# Patient Record
Sex: Male | Born: 1940
Health system: Southern US, Community
[De-identification: ages and names within clinical notes are randomized; demographics above are authoritative.]

## PROBLEM LIST (undated history)

## (undated) DIAGNOSIS — N138 Other obstructive and reflux uropathy: Secondary | ICD-10-CM

## (undated) DIAGNOSIS — Z972 Presence of dental prosthetic device (complete) (partial): Secondary | ICD-10-CM

## (undated) DIAGNOSIS — N401 Enlarged prostate with lower urinary tract symptoms: Secondary | ICD-10-CM

## (undated) DIAGNOSIS — M950 Acquired deformity of nose: Secondary | ICD-10-CM

## (undated) DIAGNOSIS — M5030 Other cervical disc degeneration, unspecified cervical region: Secondary | ICD-10-CM

## (undated) DIAGNOSIS — C4331 Malignant melanoma of nose: Secondary | ICD-10-CM

## (undated) DIAGNOSIS — K429 Umbilical hernia without obstruction or gangrene: Secondary | ICD-10-CM

## (undated) DIAGNOSIS — R37 Sexual dysfunction, unspecified: Secondary | ICD-10-CM

## (undated) DIAGNOSIS — E785 Hyperlipidemia, unspecified: Secondary | ICD-10-CM

## (undated) DIAGNOSIS — K219 Gastro-esophageal reflux disease without esophagitis: Secondary | ICD-10-CM

## (undated) DIAGNOSIS — I10 Essential (primary) hypertension: Secondary | ICD-10-CM

## (undated) DIAGNOSIS — Z973 Presence of spectacles and contact lenses: Secondary | ICD-10-CM

## (undated) DIAGNOSIS — Z860101 Personal history of adenomatous and serrated colon polyps: Secondary | ICD-10-CM

## (undated) DIAGNOSIS — M1990 Unspecified osteoarthritis, unspecified site: Secondary | ICD-10-CM

## (undated) DIAGNOSIS — Z8719 Personal history of other diseases of the digestive system: Secondary | ICD-10-CM

## (undated) DIAGNOSIS — K4021 Bilateral inguinal hernia, without obstruction or gangrene, recurrent: Secondary | ICD-10-CM

## (undated) DIAGNOSIS — Z9889 Other specified postprocedural states: Secondary | ICD-10-CM

## (undated) DIAGNOSIS — K5730 Diverticulosis of large intestine without perforation or abscess without bleeding: Secondary | ICD-10-CM

## (undated) DIAGNOSIS — K449 Diaphragmatic hernia without obstruction or gangrene: Secondary | ICD-10-CM

## (undated) DIAGNOSIS — R05 Cough: Secondary | ICD-10-CM

## (undated) DIAGNOSIS — Z86010 Personal history of colonic polyps: Secondary | ICD-10-CM

## (undated) DIAGNOSIS — R7303 Prediabetes: Secondary | ICD-10-CM

## (undated) HISTORY — DX: Gastro-esophageal reflux disease without esophagitis: K21.9

## (undated) HISTORY — PX: INGUINAL HERNIA REPAIR: SUR1180

## (undated) HISTORY — PX: COLONOSCOPY: SHX174

## (undated) HISTORY — PX: ESOPHAGOGASTRODUODENOSCOPY (EGD) WITH ESOPHAGEAL DILATION: SHX5812

## (undated) HISTORY — PX: UMBILICAL HERNIA REPAIR: SHX196

## (undated) HISTORY — PX: HEMORROIDECTOMY: SUR656

---

## 1999-02-17 ENCOUNTER — Emergency Department (HOSPITAL_COMMUNITY): Admission: EM | Admit: 1999-02-17 | Discharge: 1999-02-17 | Payer: Self-pay | Admitting: Emergency Medicine

## 1999-02-19 ENCOUNTER — Emergency Department (HOSPITAL_COMMUNITY): Admission: EM | Admit: 1999-02-19 | Discharge: 1999-02-19 | Payer: Self-pay | Admitting: Emergency Medicine

## 1999-05-20 ENCOUNTER — Ambulatory Visit (HOSPITAL_COMMUNITY): Admission: RE | Admit: 1999-05-20 | Discharge: 1999-05-20 | Payer: Self-pay | Admitting: Emergency Medicine

## 1999-05-20 ENCOUNTER — Encounter: Payer: Self-pay | Admitting: Emergency Medicine

## 2001-01-01 ENCOUNTER — Encounter (INDEPENDENT_AMBULATORY_CARE_PROVIDER_SITE_OTHER): Payer: Self-pay

## 2001-01-01 ENCOUNTER — Ambulatory Visit (HOSPITAL_COMMUNITY): Admission: RE | Admit: 2001-01-01 | Discharge: 2001-01-01 | Payer: Self-pay | Admitting: Gastroenterology

## 2002-01-01 ENCOUNTER — Encounter: Payer: Self-pay | Admitting: Emergency Medicine

## 2002-01-01 ENCOUNTER — Encounter: Admission: RE | Admit: 2002-01-01 | Discharge: 2002-01-01 | Payer: Self-pay | Admitting: Emergency Medicine

## 2005-01-30 ENCOUNTER — Ambulatory Visit: Payer: Self-pay | Admitting: Gastroenterology

## 2005-02-13 ENCOUNTER — Encounter (INDEPENDENT_AMBULATORY_CARE_PROVIDER_SITE_OTHER): Payer: Self-pay | Admitting: *Deleted

## 2005-02-13 ENCOUNTER — Ambulatory Visit: Payer: Self-pay | Admitting: Gastroenterology

## 2005-10-19 ENCOUNTER — Ambulatory Visit: Payer: Self-pay | Admitting: Gastroenterology

## 2005-10-25 ENCOUNTER — Ambulatory Visit: Payer: Self-pay | Admitting: Gastroenterology

## 2005-11-24 ENCOUNTER — Ambulatory Visit: Payer: Self-pay | Admitting: Gastroenterology

## 2006-02-02 ENCOUNTER — Ambulatory Visit: Payer: Self-pay | Admitting: Gastroenterology

## 2006-02-20 ENCOUNTER — Ambulatory Visit: Payer: Self-pay | Admitting: Gastroenterology

## 2006-03-01 ENCOUNTER — Encounter: Payer: Self-pay | Admitting: Gastroenterology

## 2008-09-28 ENCOUNTER — Ambulatory Visit: Payer: Self-pay | Admitting: Internal Medicine

## 2008-09-28 DIAGNOSIS — E785 Hyperlipidemia, unspecified: Secondary | ICD-10-CM

## 2008-09-28 LAB — CONVERTED CEMR LAB
Cholesterol, target level: 200 mg/dL
HDL goal, serum: 40 mg/dL
LDL Goal: 160 mg/dL

## 2008-12-03 ENCOUNTER — Ambulatory Visit: Payer: Self-pay | Admitting: Internal Medicine

## 2008-12-03 DIAGNOSIS — R9431 Abnormal electrocardiogram [ECG] [EKG]: Secondary | ICD-10-CM

## 2008-12-03 DIAGNOSIS — R351 Nocturia: Secondary | ICD-10-CM

## 2008-12-03 DIAGNOSIS — M19049 Primary osteoarthritis, unspecified hand: Secondary | ICD-10-CM | POA: Insufficient documentation

## 2008-12-03 DIAGNOSIS — N401 Enlarged prostate with lower urinary tract symptoms: Secondary | ICD-10-CM

## 2008-12-03 LAB — CONVERTED CEMR LAB
ALT: 20 units/L (ref 0–53)
AST: 27 units/L (ref 0–37)
Albumin: 4 g/dL (ref 3.5–5.2)
Alkaline Phosphatase: 59 units/L (ref 39–117)
BUN: 20 mg/dL (ref 6–23)
Basophils Absolute: 0 10*3/uL (ref 0.0–0.1)
Basophils Relative: 0.6 % (ref 0.0–3.0)
Bilirubin Urine: NEGATIVE
Bilirubin, Direct: 0.1 mg/dL (ref 0.0–0.3)
CO2: 31 meq/L (ref 19–32)
Calcium: 8.9 mg/dL (ref 8.4–10.5)
Chloride: 106 meq/L (ref 96–112)
Cholesterol: 201 mg/dL — ABNORMAL HIGH (ref 0–200)
Creatinine, Ser: 0.9 mg/dL (ref 0.4–1.5)
Direct LDL: 138 mg/dL
Eosinophils Absolute: 0.2 10*3/uL (ref 0.0–0.7)
Eosinophils Relative: 3 % (ref 0.0–5.0)
GFR calc non Af Amer: 89.2 mL/min (ref >60)
Glucose, Bld: 108 mg/dL — ABNORMAL HIGH (ref 70–99)
HCT: 42.4 % (ref 39.0–52.0)
HDL: 53.1 mg/dL (ref >39.00)
Hemoglobin: 14.7 g/dL (ref 13.0–17.0)
Ketones, ur: NEGATIVE mg/dL
Leukocytes, UA: NEGATIVE
Lymphocytes Relative: 32.5 % (ref 12.0–46.0)
Lymphs Abs: 1.8 10*3/uL (ref 0.7–4.0)
MCHC: 34.7 g/dL (ref 30.0–36.0)
MCV: 89.6 fL (ref 78.0–100.0)
Monocytes Absolute: 0.6 10*3/uL (ref 0.1–1.0)
Monocytes Relative: 11.2 % (ref 3.0–12.0)
Neutro Abs: 2.8 10*3/uL (ref 1.4–7.7)
Neutrophils Relative %: 52.7 % (ref 43.0–77.0)
Nitrite: NEGATIVE
PSA: 0.35 ng/mL (ref 0.10–4.00)
Platelets: 220 10*3/uL (ref 150.0–400.0)
Potassium: 4.3 meq/L (ref 3.5–5.1)
RBC: 4.72 M/uL (ref 4.22–5.81)
RDW: 12.3 % (ref 11.5–14.6)
Sodium: 142 meq/L (ref 135–145)
Specific Gravity, Urine: >=1.03 (ref 1.000–1.030)
TSH: 1.56 microintl units/mL (ref 0.35–5.50)
Total Bilirubin: 0.8 mg/dL (ref 0.3–1.2)
Total CHOL/HDL Ratio: 4
Total Protein, Urine: NEGATIVE mg/dL
Total Protein: 6.7 g/dL (ref 6.0–8.3)
Triglycerides: 47 mg/dL (ref 0.0–149.0)
Urine Glucose: NEGATIVE mg/dL
Urobilinogen, UA: 0.2 (ref 0.0–1.0)
VLDL: 9.4 mg/dL (ref 0.0–40.0)
WBC: 5.4 10*3/uL (ref 4.5–10.5)
pH: 5.5 (ref 5.0–8.0)

## 2008-12-14 DIAGNOSIS — Z860100 Personal history of colon polyps, unspecified: Secondary | ICD-10-CM | POA: Insufficient documentation

## 2008-12-14 DIAGNOSIS — K5730 Diverticulosis of large intestine without perforation or abscess without bleeding: Secondary | ICD-10-CM | POA: Insufficient documentation

## 2008-12-14 DIAGNOSIS — K2970 Gastritis, unspecified, without bleeding: Secondary | ICD-10-CM | POA: Insufficient documentation

## 2008-12-14 DIAGNOSIS — K449 Diaphragmatic hernia without obstruction or gangrene: Secondary | ICD-10-CM | POA: Insufficient documentation

## 2008-12-14 DIAGNOSIS — K222 Esophageal obstruction: Secondary | ICD-10-CM | POA: Insufficient documentation

## 2008-12-14 DIAGNOSIS — K2990 Gastroduodenitis, unspecified, without bleeding: Secondary | ICD-10-CM

## 2008-12-15 ENCOUNTER — Ambulatory Visit: Payer: Self-pay | Admitting: Gastroenterology

## 2008-12-17 ENCOUNTER — Telehealth: Payer: Self-pay | Admitting: Gastroenterology

## 2008-12-17 ENCOUNTER — Encounter: Payer: Self-pay | Admitting: Gastroenterology

## 2008-12-21 ENCOUNTER — Encounter: Payer: Self-pay | Admitting: Internal Medicine

## 2009-01-08 ENCOUNTER — Telehealth: Payer: Self-pay | Admitting: Gastroenterology

## 2009-01-11 ENCOUNTER — Ambulatory Visit: Payer: Self-pay | Admitting: Gastroenterology

## 2009-01-11 ENCOUNTER — Encounter: Payer: Self-pay | Admitting: Gastroenterology

## 2009-01-13 ENCOUNTER — Encounter: Payer: Self-pay | Admitting: Gastroenterology

## 2009-01-14 ENCOUNTER — Encounter: Payer: Self-pay | Admitting: Gastroenterology

## 2009-02-12 ENCOUNTER — Ambulatory Visit: Payer: Self-pay | Admitting: Internal Medicine

## 2009-02-12 LAB — CONVERTED CEMR LAB
ALT: 22 units/L (ref 0–53)
AST: 27 units/L (ref 0–37)
Albumin: 3.9 g/dL (ref 3.5–5.2)
Alkaline Phosphatase: 59 units/L (ref 39–117)
BUN: 24 mg/dL — ABNORMAL HIGH (ref 6–23)
Basophils Relative: 0.5 % (ref 0.0–3.0)
Bilirubin Urine: NEGATIVE
Bilirubin, Direct: 0.1 mg/dL (ref 0.0–0.3)
CO2: 31 meq/L (ref 19–32)
Calcium: 9.3 mg/dL (ref 8.4–10.5)
Chloride: 107 meq/L (ref 96–112)
Creatinine, Ser: 1 mg/dL (ref 0.4–1.5)
Eosinophils Relative: 0.8 % (ref 0.0–5.0)
GFR calc non Af Amer: 78.94 mL/min (ref >60)
Glucose, Bld: 106 mg/dL — ABNORMAL HIGH (ref 70–99)
HCT: 43.5 % (ref 39.0–52.0)
Hemoglobin, Urine: NEGATIVE
Hemoglobin: 15 g/dL (ref 13.0–17.0)
Leukocytes, UA: NEGATIVE
Lymphocytes Relative: 7.8 % — ABNORMAL LOW (ref 12.0–46.0)
MCHC: 34.5 g/dL (ref 30.0–36.0)
MCV: 90.5 fL (ref 78.0–100.0)
Monocytes Relative: 6.1 % (ref 3.0–12.0)
Neutrophils Relative %: 84.8 % — ABNORMAL HIGH (ref 43.0–77.0)
Nitrite: NEGATIVE
Platelets: 155 10*3/uL (ref 150.0–400.0)
Potassium: 3.8 meq/L (ref 3.5–5.1)
RBC: 4.81 M/uL (ref 4.22–5.81)
RDW: 12.7 % (ref 11.5–14.6)
Sodium: 141 meq/L (ref 135–145)
Specific Gravity, Urine: >=1.03 (ref 1.000–1.030)
Total Bilirubin: 0.8 mg/dL (ref 0.3–1.2)
Total Protein, Urine: NEGATIVE mg/dL
Total Protein: 7 g/dL (ref 6.0–8.3)
Urine Glucose: NEGATIVE mg/dL
Urobilinogen, UA: 0.2 (ref 0.0–1.0)
WBC: 8.8 10*3/uL (ref 4.5–10.5)
pH: 5.5 (ref 5.0–8.0)

## 2009-02-13 ENCOUNTER — Encounter: Payer: Self-pay | Admitting: Internal Medicine

## 2009-06-09 ENCOUNTER — Ambulatory Visit: Payer: Self-pay | Admitting: Internal Medicine

## 2009-12-30 ENCOUNTER — Encounter: Payer: Self-pay | Admitting: Gastroenterology

## 2010-01-12 ENCOUNTER — Telehealth: Payer: Self-pay | Admitting: Gastroenterology

## 2010-01-13 ENCOUNTER — Telehealth: Payer: Self-pay | Admitting: Gastroenterology

## 2010-02-01 ENCOUNTER — Ambulatory Visit: Payer: Self-pay | Admitting: Internal Medicine

## 2010-02-01 ENCOUNTER — Telehealth: Payer: Self-pay | Admitting: Internal Medicine

## 2010-02-01 DIAGNOSIS — M79609 Pain in unspecified limb: Secondary | ICD-10-CM

## 2010-02-01 LAB — CONVERTED CEMR LAB
ALT: 24 units/L (ref 0–53)
AST: 28 units/L (ref 0–37)
Albumin: 4.3 g/dL (ref 3.5–5.2)
Alkaline Phosphatase: 61 units/L (ref 39–117)
BUN: 21 mg/dL (ref 6–23)
Basophils Absolute: 0.1 10*3/uL (ref 0.0–0.1)
Basophils Relative: 1 % (ref 0.0–3.0)
Bilirubin, Direct: 0.1 mg/dL (ref 0.0–0.3)
CO2: 30 meq/L (ref 19–32)
Calcium: 9 mg/dL (ref 8.4–10.5)
Chloride: 106 meq/L (ref 96–112)
Cholesterol: 219 mg/dL — ABNORMAL HIGH (ref 0–200)
Creatinine, Ser: 0.9 mg/dL (ref 0.4–1.5)
Direct LDL: 208.6 mg/dL
Eosinophils Absolute: 0.2 10*3/uL (ref 0.0–0.7)
Eosinophils Relative: 2.7 % (ref 0.0–5.0)
GFR calc non Af Amer: 86.66 mL/min (ref >60)
Glucose, Bld: 89 mg/dL (ref 70–99)
HCT: 43.3 % (ref 39.0–52.0)
HDL: 56.6 mg/dL (ref >39.00)
Hemoglobin: 15 g/dL (ref 13.0–17.0)
Lymphocytes Relative: 26.4 % (ref 12.0–46.0)
Lymphs Abs: 1.9 10*3/uL (ref 0.7–4.0)
MCHC: 34.7 g/dL (ref 30.0–36.0)
MCV: 90.5 fL (ref 78.0–100.0)
Monocytes Absolute: 0.8 10*3/uL (ref 0.1–1.0)
Monocytes Relative: 11 % (ref 3.0–12.0)
Neutro Abs: 4.2 10*3/uL (ref 1.4–7.7)
Neutrophils Relative %: 58.9 % (ref 43.0–77.0)
PSA: 0.48 ng/mL (ref 0.10–4.00)
Platelets: 227 10*3/uL (ref 150.0–400.0)
Potassium: 4.6 meq/L (ref 3.5–5.1)
RBC: 4.78 M/uL (ref 4.22–5.81)
RDW: 13.5 % (ref 11.5–14.6)
Sodium: 141 meq/L (ref 135–145)
TSH: 1.83 microintl units/mL (ref 0.35–5.50)
Total Bilirubin: 0.7 mg/dL (ref 0.3–1.2)
Total CHOL/HDL Ratio: 4
Total Protein: 6.8 g/dL (ref 6.0–8.3)
Triglycerides: 139 mg/dL (ref 0.0–149.0)
VLDL: 27.8 mg/dL (ref 0.0–40.0)
WBC: 7.1 10*3/uL (ref 4.5–10.5)

## 2010-02-02 ENCOUNTER — Encounter: Payer: Self-pay | Admitting: Internal Medicine

## 2010-02-02 ENCOUNTER — Telehealth: Payer: Self-pay | Admitting: Internal Medicine

## 2010-02-04 ENCOUNTER — Telehealth: Payer: Self-pay | Admitting: Internal Medicine

## 2010-05-11 ENCOUNTER — Ambulatory Visit: Payer: Self-pay | Admitting: Internal Medicine

## 2010-05-11 LAB — CONVERTED CEMR LAB
ALT: 29 units/L (ref 0–53)
AST: 28 units/L (ref 0–37)
Albumin: 4.4 g/dL (ref 3.5–5.2)
Alkaline Phosphatase: 59 units/L (ref 39–117)
Bilirubin, Direct: 0.2 mg/dL (ref 0.0–0.3)
Cholesterol: 144 mg/dL (ref 0–200)
HDL: 56.7 mg/dL (ref >39.00)
LDL Cholesterol: 76 mg/dL (ref 0–99)
Total Bilirubin: 0.9 mg/dL (ref 0.3–1.2)
Total CHOL/HDL Ratio: 3
Total Protein: 6.8 g/dL (ref 6.0–8.3)
Triglycerides: 57 mg/dL (ref 0.0–149.0)
VLDL: 11.4 mg/dL (ref 0.0–40.0)

## 2010-06-28 ENCOUNTER — Ambulatory Visit: Payer: Self-pay | Admitting: Internal Medicine

## 2010-10-11 NOTE — Progress Notes (Signed)
Summary: Lipitor not covered  Phone Note From Pharmacy   Caller: [facility] and [employer] of Call: [employer] will not cover Lipitor, No PA available. Insurance preferrs Crestor or Vytorin. Please advise.  Initial call taken by: Lamar Sprinkles, CMA,  Feb 04, 2010 3:27 PM  Follow-up for Phone Call        done Follow-up by: Etta Grandchild MD,  Feb 04, 2010 3:38 PM    New/Updated Medications: CRESTOR 20 MG TABS (ROSUVASTATIN CALCIUM) One by mouth once daily Prescriptions: CRESTOR 20 MG TABS (ROSUVASTATIN CALCIUM) One by mouth once daily  #30 x 11   Entered by:   Lamar Sprinkles, CMA   Authorized by:   Etta Grandchild MD   Signed by:   Lamar Sprinkles, CMA on 02/04/2010   Method used:   Faxed to ...       Hospital doctor (retail)       [address]       [place], [place]  [address]       Ph: [phone] or [phone]       Fax: [phone]   RxID:   [id number] CRESTOR 20 MG TABS (ROSUVASTATIN CALCIUM) One by mouth once daily  #30 x 11   Entered and Authorized by:   Etta Grandchild MD   Signed by:   Etta Grandchild MD on 02/04/2010   Method used:   Historical   RxID:   [id number]

## 2010-10-11 NOTE — Assessment & Plan Note (Signed)
Summary: f/u appt/#/cd   Vital Signs:  Patient profile:   70 year old male Height:      67 inches Weight:      143 pounds O2 Sat:      98 % on Room air Temp:     97.1 degrees F oral Pulse rate:   60 / minute Pulse rhythm:   regular Resp:     16 per minute BP sitting:   138 / 80  (left arm)  O2 Flow:  Room air  Primary Care Provider:  Sanda Linger, MD   History of Present Illness:  Follow-Up Visit      This is a 70 year old man who presents for Follow-up visit.  The patient denies chest pain, palpitations, dizziness, syncope, edema, SOB, DOE, PND, and orthopnea.  Since the last visit the patient notes no new problems or concerns.  The patient reports taking meds as prescribed and dietary compliance.  When questioned about possible medication side effects, the patient notes none.    Dyspepsia History:      He has no alarm features of dyspepsia including no history of melena, hematochezia, dysphagia, persistent vomiting, or involuntary weight loss > 5%.  There is a prior history of GERD.  The patient does not have a prior history of documented ulcer disease.  The dominant symptom is heartburn or acid reflux.  An H-2 blocker medication is currently being taken.  He notes that the symptoms have improved with the H-2 blocker therapy.  Symptoms have not persisted after 4 weeks of H-2 blocker treatment.  A prior EGD has been done.    Lipid Management History:      Positive NCEP/ATP III risk factors include male age 88 years old or older.  Negative NCEP/ATP III risk factors include non-diabetic, no family history for ischemic heart disease, non-tobacco-user status, non-hypertensive, no ASHD (atherosclerotic heart disease), no prior stroke/TIA, no peripheral vascular disease, and no history of aortic aneurysm.        The patient states that he knows about the "Therapeutic Lifestyle Change" diet.  His compliance with the TLC diet is good.  The patient expresses understanding of adjunctive measures  for cholesterol lowering.  Adjunctive measures started by the patient include aerobic exercise, fiber, ASA, omega-3 supplements, limit alcohol consumpton, and weight reduction.  He expresses no side effects from his lipid-lowering medication.  The patient denies any symptoms to suggest myopathy or liver disease.      Preventive Screening-Counseling & Management  Alcohol-Tobacco     Alcohol drinks/day: <1     Alcohol type: wine     >5/day in last 3 mos: no     Alcohol Counseling: not indicated; use of alcohol is not excessive or problematic     Feels need to cut down: no     Feels annoyed by complaints: no     Feels guilty re: drinking: no     Needs 'eye opener' in am: no     Smoking Status: never     Passive Smoke Exposure: no     Tobacco Counseling: not indicated; no tobacco use  Hep-HIV-STD-Contraception     Hepatitis Risk: no risk noted     HIV Risk: no risk noted     STD Risk: no risk noted     Dental Visit-last 6 months yes     TSE monthly: yes     Sun Exposure-Excessive: yes     Sun Exposure Counseling: to decrease sun exposure  Sexual History:  currently monogamous.        Drug Use:  never.        Blood Transfusions:  no.    Clinical Review Panels:  Prevention   Last Colonoscopy:  Location:  [facility].  (01/11/2009)   Last PSA:  0.48 (02/01/2010)  Immunizations   Last Tetanus Booster:  Td (02/01/2010)   Last Flu Vaccine:  Fluvax 3+ (06/09/2009)  Lipid Management   Cholesterol:  219 (02/01/2010)   HDL (good cholesterol):  56.60 (02/01/2010)  Diabetes Management   Creatinine:  0.9 (02/01/2010)   Last Flu Vaccine:  Fluvax 3+ (06/09/2009)  CBC   WBC:  7.1 (02/01/2010)   RBC:  4.78 (02/01/2010)   Hgb:  15.0 (02/01/2010)   Hct:  43.3 (02/01/2010)   Platelets:  227.0 (02/01/2010)   MCV  90.5 (02/01/2010)   MCHC  34.7 (02/01/2010)   RDW  13.5 (02/01/2010)   PMN:  58.9 (02/01/2010)   Lymphs:  26.4 (02/01/2010)   Monos:  11.0  (02/01/2010)   Eosinophils:  2.7 (02/01/2010)   Basophil:  1.0 (02/01/2010)  Complete Metabolic Panel   Glucose:  89 (02/01/2010)   Sodium:  141 (02/01/2010)   Potassium:  4.6 (02/01/2010)   Chloride:  106 (02/01/2010)   CO2:  30 (02/01/2010)   BUN:  21 (02/01/2010)   Creatinine:  0.9 (02/01/2010)   Albumin:  4.3 (02/01/2010)   Total Protein:  6.8 (02/01/2010)   Calcium:  9.0 (02/01/2010)   Total Bili:  0.7 (02/01/2010)   Alk Phos:  61 (02/01/2010)   SGPT (ALT):  24 (02/01/2010)   SGOT (AST):  28 (02/01/2010)   Medications Prior to Update: 1)  Bayer Low Strength 81 Mg Tbec (Aspirin) .... 2 Qd 2)  Centrum  Tabs (Multiple Vitamins-Minerals) .... Take 1 Tablet By Mouth Once A Day 3)  Aciphex 20 Mg Tbec (Rabeprazole Sodium) .... One By Mouth Once Daily 4)  Crestor 20 Mg Tabs (Rosuvastatin Calcium) .... One By Mouth Once Daily  Current Medications (verified): 1)  Bayer Low Strength 81 Mg Tbec (Aspirin) .... 2 Qd 2)  Centrum  Tabs (Multiple Vitamins-Minerals) .... Take 1 Tablet By Mouth Once A Day 3)  Aciphex 20 Mg Tbec (Rabeprazole Sodium) .... One By Mouth Once Daily 4)  Crestor 20 Mg Tabs (Rosuvastatin Calcium) .... One By Mouth Once Daily  Allergies (verified): No Known Drug Allergies  Past History:  Past Medical History: Last updated: 12/14/2008 Current Problems:  GASTRITIS (ICD-535.50) HIATAL HERNIA WITH REFLUX (ICD-553.3) ESOPHAGEAL STRICTURE (ICD-530.3) DIVERTICULOSIS, COLON (ICD-562.10) DEGENERATIVE JOINT DISEASE, HANDS (ICD-715.94) INCI HERNIA WITHOUT MENTION OBSTRUCTION/GANGRENE (ICD-553.21) ELECTROCARDIOGRAM, ABNORMAL (ICD-794.31) HYPERTROPHY PROSTATE W/UR OBST & OTH LUTS (ICD-600.01) BLOOD IN STOOL (ICD-578.1) FAMILY HISTORY DIABETES 1ST DEGREE RELATIVE (ICD-V18.0) HYPERLIPIDEMIA (ICD-272.4) COLONIC POLYPS, ADENOMATOUS, HX OF (ICD-V12.72)  Past Surgical History: Last updated: 09/28/2008 Hemorrhoidectomy Inguinal herniorrhaphy  Family History: Last  updated: 12/15/2008 Family History of Arthritis Family History Diabetes 1st degree relative: Mother Family History High cholesterol Family History Hypertension Family History of Colon Polyps: Mother  Social History: Last updated: 12/15/2008 Retired Married, 1 boy Never Smoked Alcohol use-no Drug use-no Regular exercise-yes Daily Caffeine Use 1 1/2 cup coffee in AM  Risk Factors: Alcohol Use: <1 (05/11/2010) >5 drinks/d w/in last 3 months: no (05/11/2010) Exercise: yes (09/28/2008)  Risk Factors: Smoking Status: never (05/11/2010) Passive Smoke Exposure: no (05/11/2010)  Family History: Reviewed history from 12/15/2008 and no changes required. Family History of Arthritis Family History Diabetes 1st degree relative: Mother Family History  High cholesterol Family History Hypertension Family History of Colon Polyps: Mother  Social History: Reviewed history from 12/15/2008 and no changes required. Retired Married, 1 boy Never Smoked Alcohol use-no Drug use-no Regular exercise-yes Daily Caffeine Use 1 1/2 cup coffee in AM  Review of Systems  The patient denies anorexia, fever, weight loss, weight gain, chest pain, dyspnea on exertion, peripheral edema, abdominal pain, hematuria, suspicious skin lesions, difficulty walking, depression, and enlarged lymph nodes.    Physical Exam  General:  alert, well-developed, well-nourished, well-hydrated, appropriate dress, normal appearance, healthy-appearing, cooperative to examination, and good hygiene.   Head:  normocephalic, atraumatic, no abnormalities observed, and no abnormalities palpated.   Mouth:  Oral mucosa and oropharynx without lesions or exudates.  Teeth in good repair. Neck:  supple, full ROM, no masses, no thyromegaly, no thyroid nodules or tenderness, no JVD, normal carotid upstroke, no carotid bruits, no cervical lymphadenopathy, and no neck tenderness.   Lungs:  normal respiratory effort, no intercostal  retractions, no accessory muscle use, normal breath sounds, no dullness, no fremitus, no crackles, and no wheezes.   Heart:  normal rate, regular rhythm, no murmur, no gallop, no rub, and no JVD.   Abdomen:  soft, non-tender, normal bowel sounds, no distention, no masses, no guarding, no rigidity, no rebound tenderness, no abdominal hernia, no inguinal hernia, no hepatomegaly, and no splenomegaly.   Rectal:  No external abnormalities noted. Normal sphincter tone. No rectal masses or tenderness. heme negative stool. external hemorrhoid(s) that are uncomplicated.   Msk:  normal ROM, no joint tenderness, no joint swelling, no joint warmth, no redness over joints, no joint deformities, no joint instability, no crepitation, and no muscle atrophy.   Extremities:  No clubbing, cyanosis, edema, or deformity noted with normal full range of motion of all joints.   Neurologic:  No cranial nerve deficits noted. Station and gait are normal. Plantar reflexes are down-going bilaterally. DTRs are symmetrical throughout. Sensory, motor and coordinative functions appear intact. Skin:  turgor normal, color normal, no rashes, no suspicious lesions, no ecchymoses, no petechiae, no purpura, no ulcerations, and no edema.  left foot appears normal and is non-tender. Cervical Nodes:  No lymphadenopathy noted Psych:  Cognition and judgment appear intact. Alert and cooperative with normal attention span and concentration. No apparent delusions, illusions, hallucinations   Impression & Recommendations:  Problem # 1:  GASTRITIS (ICD-535.50) Assessment Unchanged  His updated medication list for this problem includes:    Aciphex 20 Mg Tbec (Rabeprazole sodium) ..... One by mouth once daily  Problem # 2:  HYPERLIPIDEMIA (ICD-272.4) Assessment: Improved  His updated medication list for this problem includes:    Crestor 20 Mg Tabs (Rosuvastatin calcium) ..... One by mouth once daily  Orders: Venipuncture ([address]) TLB-Lipid  Panel (80061-LIPID) TLB-Hepatic/Liver Function Pnl (80076-HEPATIC)  Labs Reviewed: SGOT: 28 (02/01/2010)   SGPT: 24 (02/01/2010)  Lipid Goals: Chol Goal: 200 (09/28/2008)   HDL Goal: 40 (09/28/2008)   LDL Goal: 160 (09/28/2008)   TG Goal: 150 (09/28/2008)  Prior 10 Yr Risk Heart Disease: Not enough information (09/28/2008)   HDL:56.60 (02/01/2010), 53.10 (12/03/2008)  Chol:219 (02/01/2010), 201 (12/03/2008)  Trig:139.0 (02/01/2010), 47.0 (12/03/2008)  Complete Medication List: 1)  Bayer Low Strength 81 Mg Tbec (Aspirin) .... 2 qd 2)  Centrum Tabs (Multiple vitamins-minerals) .... Take 1 tablet by mouth once a day 3)  Aciphex 20 Mg Tbec (Rabeprazole sodium) .... One by mouth once daily 4)  Crestor 20 Mg Tabs (Rosuvastatin calcium) .... One by mouth once  daily  Lipid Assessment/Plan:      Based on NCEP/ATP III, the patient's risk factor category is "0-1 risk factors".  The patient's lipid goals are as follows: Total cholesterol goal is 200; LDL cholesterol goal is 160; HDL cholesterol goal is 40; Triglyceride goal is 150.    Colorectal Screening:  Current Recommendations:    Hemoccult: NEG X 1 today  PSA Screening:    PSA: 0.48  (02/01/2010)  Immunization & Chemoprophylaxis:    Tetanus vaccine: Td  (02/01/2010)    Influenza vaccine: Fluvax 3+  (06/09/2009)  Patient Instructions: 1)  Please schedule a follow-up appointment in 6 months. 2)  It is important that you exercise regularly at least 20 minutes 5 times a week. If you develop chest pain, have severe difficulty breathing, or feel very tired , stop exercising immediately and seek medical attention. 3)  Take 650-1000mg  of Tylenol every 4-6 hours as needed for relief of pain or comfort of fever AVOID taking more than 4000mg   in a 24 hour period (can cause liver damage in higher doses).

## 2010-10-11 NOTE — Assessment & Plan Note (Signed)
Summary: FLU VAC  [facility]J [facility]  Nurse Visit   Allergies: No Known Drug Allergies  Orders Added: 1)  Flu Vaccine 80yrs + MEDICARE PATIENTS [Q2039] 2)  Administration Flu vaccine - MCR [G0008] .lbmedflu   Flu Vaccine Consent Questions     Do you have a history of severe allergic reactions to this vaccine? no    Any prior history of allergic reactions to egg and/or gelatin? no    Do you have a sensitivity to the preservative Thimersol? no    Do you have a past history of Guillan-Barre Syndrome? no    Do you currently have an acute febrile illness? no    Have you ever had a severe reaction to latex? no    Vaccine information given and explained to patient? yes    Are you currently pregnant? no    Lot Number:[id number]   Exp Date:03/11/2011   Site Given  Right Deltoid IM Lanier Prude, [facility])  June 28, 2010 11:53 AM

## 2010-10-11 NOTE — Progress Notes (Signed)
Summary: Cholesterol  Phone Note Call from Patient Call back at [facility] Phone [phone] Call back at [phone]   Summary of Call: Pt is concerned about high starting dose of Lipitor.  Initial call taken by: Lamar Sprinkles, CMA,  Feb 04, 2010 9:39 AM  Follow-up for Phone Call        i am not concerned Follow-up by: Etta Grandchild MD,  Feb 04, 2010 10:12 AM  Additional Follow-up for Phone Call Additional follow up Details #1::        Pt informed  Additional Follow-up by: Lamar Sprinkles, CMA,  Feb 04, 2010 12:07 PM

## 2010-10-11 NOTE — Progress Notes (Signed)
Summary: PA aciphex  Phone Note Outgoing Call   Summary of Call: Called pt insurance  [place] spoke with Victorino Dike  (ID [id number]) and advised that pt has tried and failed nexium, omeprazole. PA for aciphex was approved from 02/01/10 thru 09/10/2010. Pt notified and rx sent at appt.  Initial call taken by: Rock Nephew CMA,  Feb 01, 2010 11:06 AM

## 2010-10-11 NOTE — Letter (Signed)
Summary: Results Follow-up Letter  [facility]  [address] [place], [place] [address]   Phone: [phone]  Fax: [phone]    02/02/2010  [address] [place], [place]  [address]  Dear Mr. Dampier,   The following are the results of your recent test(s):  Test     Result     Prostate     normal Thyroid     normal Liver/kidney   normal CBC       normal  _________________________________________________________  Please call for an appointment soon _________________________________________________________ _________________________________________________________ _________________________________________________________  Sincerely,  Sanda Linger MD [facility]

## 2010-10-11 NOTE — Assessment & Plan Note (Signed)
Summary: YEARLY   STC   Vital Signs:  Patient profile:   70 year old male Height:      67 inches Weight:      150.50 pounds BMI:     23.66 O2 Sat:      98 % on Room air Temp:     98.4 degrees F oral Pulse rate:   60 / minute Pulse rhythm:   regular Resp:     16 per minute BP sitting:   128 / 72  (left arm) Cuff size:   large  Vitals Entered By: Rock Nephew CMA (Feb 01, 2010 10:54 AM)  O2 Flow:  Room air  Primary Care Provider:  Sanda Linger, MD   History of Present Illness: He returns for a complete physical. He complains of chronic left heel pain. It hurts when he walks on it.    Dyspepsia History:      He has no alarm features of dyspepsia including no history of melena, hematochezia, dysphagia, persistent vomiting, or involuntary weight loss > 5%.  There is a prior history of GERD.  He notes that there have been breakthrough symptoms despite maximum H-2 blocker or PPI therapy.  The patient does not have a prior history of documented ulcer disease.  The dominant symptom is heartburn or acid reflux.  An H-2 blocker medication is currently being taken.  He notes that the symptoms have improved with the H-2 blocker therapy.  Symptoms have persisted after 4 weeks of H-2 blocker treatment.  A prior EGD has been done.    Lipid Management History:      Positive NCEP/ATP III risk factors include male age 55 years old or older.  Negative NCEP/ATP III risk factors include non-diabetic, no family history for ischemic heart disease, non-tobacco-user status, non-hypertensive, no ASHD (atherosclerotic heart disease), no prior stroke/TIA, no peripheral vascular disease, and no history of aortic aneurysm.        The patient states that he knows about the "Therapeutic Lifestyle Change" diet.  His compliance with the TLC diet is good.  The patient expresses understanding of adjunctive measures for cholesterol lowering.  Adjunctive measures started by the patient include aerobic exercise, fiber,  limit alcohol consumpton, and weight reduction.  He expresses no side effects from his lipid-lowering medication.  The patient denies any symptoms to suggest myopathy or liver disease.      Preventive Screening-Counseling & Management  Alcohol-Tobacco     Alcohol drinks/day: <1     Smoking Status: never     Passive Smoke Exposure: no  Hep-HIV-STD-Contraception     Hepatitis Risk: no risk noted     HIV Risk: no risk noted     STD Risk: no risk noted     Dental Visit-last 6 months yes     TSE monthly: yes     Sun Exposure-Excessive: yes     Sun Exposure Counseling: to decrease sun exposure      Sexual History:  currently monogamous.        Drug Use:  never.        Blood Transfusions:  no.    Current Medications (verified): 1)  Bayer Low Strength 81 Mg Tbec (Aspirin) .... 2 Qd 2)  Centrum  Tabs (Multiple Vitamins-Minerals) .... Take 1 Tablet By Mouth Once A Day  Allergies (verified): No Known Drug Allergies  Past History:  Past Medical History: Reviewed history from 12/14/2008 and no changes required. Current Problems:  GASTRITIS (ICD-535.50) HIATAL HERNIA WITH REFLUX (ICD-553.3) ESOPHAGEAL  STRICTURE (ICD-530.3) DIVERTICULOSIS, COLON (ICD-562.10) DEGENERATIVE JOINT DISEASE, HANDS (ICD-715.94) INCI HERNIA WITHOUT MENTION OBSTRUCTION/GANGRENE (ICD-553.21) ELECTROCARDIOGRAM, ABNORMAL (ICD-794.31) HYPERTROPHY PROSTATE W/UR OBST & OTH LUTS (ICD-600.01) BLOOD IN STOOL (ICD-578.1) FAMILY HISTORY DIABETES 1ST DEGREE RELATIVE (ICD-V18.0) HYPERLIPIDEMIA (ICD-272.4) COLONIC POLYPS, ADENOMATOUS, HX OF (ICD-V12.72)  Past Surgical History: Reviewed history from 09/28/2008 and no changes required. Hemorrhoidectomy Inguinal herniorrhaphy  Family History: Reviewed history from 12/15/2008 and no changes required. Family History of Arthritis Family History Diabetes 1st degree relative: Mother Family History High cholesterol Family History Hypertension Family History of Colon  Polyps: Mother  Social History: Reviewed history from 12/15/2008 and no changes required. Retired Married, 1 boy Never Smoked Alcohol use-no Drug use-no Regular exercise-yes Daily Caffeine Use 1 1/2 cup coffee in AM  Review of Systems  The patient denies anorexia, fever, weight loss, weight gain, chest pain, syncope, dyspnea on exertion, peripheral edema, prolonged cough, headaches, hemoptysis, abdominal pain, melena, hematochezia, severe indigestion/heartburn, hematuria, suspicious skin lesions, difficulty walking, depression, enlarged lymph nodes, angioedema, and testicular masses.    Physical Exam  General:  alert, well-developed, well-nourished, well-hydrated, appropriate dress, normal appearance, healthy-appearing, cooperative to examination, and good hygiene.   Head:  normocephalic, atraumatic, no abnormalities observed, and no abnormalities palpated.   Eyes:  No corneal or conjunctival inflammation noted. EOMI. Perrla. Funduscopic exam benign, without hemorrhages, exudates or papilledema. Vision grossly normal. Mouth:  Oral mucosa and oropharynx without lesions or exudates.  Teeth in good repair. Neck:  supple, full ROM, no masses, no thyromegaly, no thyroid nodules or tenderness, no JVD, normal carotid upstroke, no carotid bruits, no cervical lymphadenopathy, and no neck tenderness.   Lungs:  normal respiratory effort, no intercostal retractions, no accessory muscle use, normal breath sounds, no dullness, no fremitus, no crackles, and no wheezes.   Heart:  normal rate, regular rhythm, no murmur, no gallop, no rub, and no JVD.   Abdomen:  soft, non-tender, normal bowel sounds, no distention, no masses, no guarding, no rigidity, no rebound tenderness, no abdominal hernia, no inguinal hernia, no hepatomegaly, and no splenomegaly.   Rectal:  No external abnormalities noted. Normal sphincter tone. No rectal masses or tenderness. heme negative stool. external hemorrhoid(s) that are  uncomplicated.   Genitalia:  uncircumcised, no hydrocele, no varicocele, no scrotal masses, no testicular masses or atrophy, no cutaneous lesions, and no urethral discharge.   Prostate:  no gland enlargement, no nodules, no asymmetry, and no induration.   Msk:  normal ROM, no joint tenderness, no joint swelling, no joint warmth, no redness over joints, no joint deformities, no joint instability, no crepitation, and no muscle atrophy.   Pulses:  R and L carotid,radial,femoral,dorsalis pedis and posterior tibial pulses are full and equal bilaterally Extremities:  No clubbing, cyanosis, edema, or deformity noted with normal full range of motion of all joints.   Neurologic:  No cranial nerve deficits noted. Station and gait are normal. Plantar reflexes are down-going bilaterally. DTRs are symmetrical throughout. Sensory, motor and coordinative functions appear intact. Skin:  turgor normal, color normal, no rashes, no suspicious lesions, no ecchymoses, no petechiae, no purpura, no ulcerations, and no edema.  left foot appears normal and is non-tender. Cervical Nodes:  No lymphadenopathy noted Axillary Nodes:  No palpable lymphadenopathy Inguinal Nodes:  No significant adenopathy Psych:  Cognition and judgment appear intact. Alert and cooperative with normal attention span and concentration. No apparent delusions, illusions, hallucinations Additional Exam:  EKG is normal.   Impression & Recommendations:  Problem # 1:  HEEL PAIN, LEFT (  ICD-729.5) Assessment New  Orders: T-Foot Left Min 3 Views (73630TC) Venipuncture [phone]) TLB-Lipid Panel (80061-LIPID) TLB-BMP (Basic Metabolic Panel-BMET) (80048-METABOL) TLB-CBC Platelet - w/Differential (85025-CBCD) TLB-Hepatic/Liver Function Pnl (80076-HEPATIC) TLB-TSH (Thyroid Stimulating Hormone) (84443-TSH) TLB-PSA (Prostate Specific Antigen) (84153-PSA)  Problem # 2:  HYPERTROPHY PROSTATE W/UR OBST & OTH LUTS (ICD-600.01) Assessment:  Improved  Orders: Venipuncture ([address]) TLB-Lipid Panel (80061-LIPID) TLB-BMP (Basic Metabolic Panel-BMET) (80048-METABOL) TLB-CBC Platelet - w/Differential (85025-CBCD) TLB-Hepatic/Liver Function Pnl (80076-HEPATIC) TLB-TSH (Thyroid Stimulating Hormone) (84443-TSH) TLB-PSA (Prostate Specific Antigen) (84153-PSA)  PSA: 0.35 (12/03/2008)     Problem # 3:  HYPERLIPIDEMIA (ICD-272.4) Assessment: Unchanged  Orders: Venipuncture (09811) TLB-Lipid Panel (80061-LIPID) TLB-BMP (Basic Metabolic Panel-BMET) (80048-METABOL) TLB-CBC Platelet - w/Differential (85025-CBCD) TLB-Hepatic/Liver Function Pnl (80076-HEPATIC) TLB-TSH (Thyroid Stimulating Hormone) (84443-TSH) TLB-PSA (Prostate Specific Antigen) (84153-PSA)  Labs Reviewed: SGOT: 27 (02/12/2009)   SGPT: 22 (02/12/2009)  Lipid Goals: Chol Goal: 200 (09/28/2008)   HDL Goal: 40 (09/28/2008)   LDL Goal: 160 (09/28/2008)   TG Goal: 150 (09/28/2008)  Prior 10 Yr Risk Heart Disease: Not enough information (09/28/2008)   HDL:53.10 (12/03/2008)  Chol:201 (12/03/2008)  Trig:47.0 (12/03/2008)  Problem # 4:  GASTRITIS (ICD-535.50) Assessment: Unchanged  The following medications were removed from the medication list:    Nexium 40 Mg Cpdr (Esomeprazole magnesium) .Marland Kitchen... 1 capsule each day 30 minutes before meal His updated medication list for this problem includes:    Aciphex 20 Mg Tbec (Rabeprazole sodium) ..... One by mouth once daily  Discussed use of medication, as well as lifestyle changes.   Orders: Hemoccult Guaiac-1 spec.(in office) (82270)  Problem # 5:  ELECTROCARDIOGRAM, ABNORMAL (ICD-794.31) Assessment: Improved  Orders: EKG w/ Interpretation (93000)  Complete Medication List: 1)  Bayer Low Strength 81 Mg Tbec (Aspirin) .... 2 qd 2)  Centrum Tabs (Multiple vitamins-minerals) .... Take 1 tablet by mouth once a day 3)  Aciphex 20 Mg Tbec (Rabeprazole sodium) .... One by mouth once daily  Other Orders: TD Toxoids IM 7 YR  + (91478) Admin 1st Vaccine (29562)  Lipid Assessment/Plan:      Based on NCEP/ATP III, the patient's risk factor category is "2 or more risk factors and a calculated 10 year CAD risk of < 20%".  The patient's lipid goals are as follows: Total cholesterol goal is 200; LDL cholesterol goal is 160; HDL cholesterol goal is 40; Triglyceride goal is 150.    PSA Screening:    PSA: 0.35  (12/03/2008)    Reviewed PSA screening recommendations: PSA ordered  Immunization & Chemoprophylaxis:    Tetanus vaccine: Td  (02/01/2010)    Influenza vaccine: Fluvax 3+  (06/09/2009)  Patient Instructions: 1)  Please schedule a follow-up appointment in 3 months. 2)  Avoid foods high in acid (tomatoes, citrus juices, spicy foods). Avoid eating within two hours of lying down or before exercising. Do not over eat; try smaller more frequent meals. Elevate head of bed twelve inches when sleeping. 3)  It is important that you exercise regularly at least 20 minutes 5 times a week. If you develop chest pain, have severe difficulty breathing, or feel very tired , stop exercising immediately and seek medical attention. Prescriptions: ACIPHEX 20 MG TBEC (RABEPRAZOLE SODIUM) One by mouth once daily  #30 x 11   Entered and Authorized by:   Etta Grandchild MD   Signed by:   Etta Grandchild MD on 02/01/2010   Method used:   Print then Give to Patient   RxID:   [id number]     Immunizations  Administered:  Tetanus Vaccine:    Vaccine Type: Td    Site: right deltoid    Mfr: GlaxoSmithKline    Dose: 0.5 ml    Route: IM    Given by: Rock Nephew CMA    Exp. Date: 12/04/2011    Lot #: [id number]    VIS given: 07/30/07 version given Feb 01, 2010.

## 2010-10-11 NOTE — Progress Notes (Signed)
  Phone Note Outgoing Call   Call placed by: Ok Anis CMA,  Jan 13, 2010 12:20 PM Call placed to: Insurer Summary of Call: Called patients drug coverage [phone]) and spoke with Dorinda Hill. Dorinda Hill said that they would not approve Aciphex. Wants patient to try Nexium first and if patient fails Nexium then they may approve Aciphex. I called the patient to inform him.     Appended Document:  Try to contact patient to inform him of the information provided by his drug coverage but phone had a busy single each called placed.  Will tell Lupita Leash S.--RN and ask if we want to just go ahead and send Rx for Nexium to patients pharmacy   Appended Document:  Called patient and his wife answered and I told patients wife that we have sent the Rx for Nexium to patients pharmacy because he has to try Nexium for thirty days and then if he fails Nexium then we will try again for Aciphex to be approved. Will have Lupita Leash S.--RN to send Rx for Nexium to patients pharmacy today   Appended Document:     Clinical Lists Changes  Medications: Changed medication from ACIPHEX 20 MG  TBEC (RABEPRAZOLE SODIUM) Take 1 each day 30 minutes before meals to NEXIUM 40 MG  CPDR (ESOMEPRAZOLE MAGNESIUM) 1 capsule each day 30 minutes before meal - Signed Rx of NEXIUM 40 MG  CPDR (ESOMEPRAZOLE MAGNESIUM) 1 capsule each day 30 minutes before meal;  #30 x 6;  Signed;  Entered by: Ashok Cordia RN;  Authorized by: Mardella Layman MD [facility];  Method used: Faxed to [facility], [phone], [place], [place], [place]  [address], Ph: [phone] or [phone], Fax: [phone]    Prescriptions: NEXIUM 40 MG  CPDR (ESOMEPRAZOLE MAGNESIUM) 1 capsule each day 30 minutes before meal  #30 x 6   Entered by:   Ashok Cordia RN   Authorized by:   Mardella Layman MD [facility]   Signed by:   Ashok Cordia RN on 01/13/2010   Method used:   Faxed to ...       Hospital doctor (retail)       [address]       [place], [place]  [address]       Ph: [phone] or [phone]       Fax: [phone]   RxID:   [id number]

## 2010-10-11 NOTE — Progress Notes (Signed)
     Follow-up for Phone Call       Follow-up by: Etta Grandchild MD,  Feb 02, 2010 8:29 AM    New/Updated Medications: LIPITOR 80 MG TABS (ATORVASTATIN CALCIUM) One by mouth once daily for cholesterol Prescriptions: LIPITOR 80 MG TABS (ATORVASTATIN CALCIUM) One by mouth once daily for cholesterol  #30 x 11   Entered and Authorized by:   Etta Grandchild MD   Signed by:   Etta Grandchild MD on 02/02/2010   Method used:   Print then Give to Patient   RxID:   [id number]

## 2010-10-11 NOTE — Letter (Signed)
Summary: Lipid Letter  [facility]  [address] [place], [place] [address]   Phone: [phone]  Fax: [phone]    02/02/2010  Raydell Maners [address] [place], [place]  [address]  Dear Chrissie Noa:  We have carefully reviewed your last lipid profile from  and the results are noted below with a summary of recommendations for lipid management.    Cholesterol:       219     Goal: <200   HDL "good" Cholesterol:   96.29     Goal: >40   LDL "bad" Cholesterol:   209     Goal: <160   Triglycerides:       139.0     Goal: <150    WOW, the LDL is very high and needs to be treated!!!!!!!!!!!!!    TLC Diet (Therapeutic Lifestyle Change): Saturated Fats & Transfatty acids should be kept < 7% of total calories ***Reduce Saturated Fats Polyunstaurated Fat can be up to 10% of total calories Monounsaturated Fat Fat can be up to 20% of total calories Total Fat should be no greater than 25-35% of total calories Carbohydrates should be 50-60% of total calories Protein should be approximately 15% of total calories Fiber should be at least 20-30 grams a day ***Increased fiber may help lower LDL Total Cholesterol should be < 200mg /day Consider adding plant stanol/sterols to diet (example: Benacol spread) ***A higher intake of unsaturated fat may reduce Triglycerides and Increase HDL    Adjunctive Measures (may lower LIPIDS and reduce risk of Heart Attack) include: Aerobic Exercise (20-30 minutes 3-4 times a week) Limit Alcohol Consumption Weight Reduction Aspirin 75-81 mg a day by mouth (if not allergic or contraindicated) Dietary Fiber 20-30 grams a day by mouth     Current Medications: 1)    Bayer Low Strength 81 Mg Tbec (Aspirin) .... 2 qd 2)    Centrum  Tabs (Multiple vitamins-minerals) .... Take 1 tablet by mouth once a day 3)    Aciphex 20 Mg Tbec (Rabeprazole sodium) .... One by mouth once daily  If you have any questions, please call. We appreciate being able to work  with you.   Sincerely,    [facility] Etta Grandchild MD

## 2010-10-11 NOTE — Miscellaneous (Signed)
Summary: Refill   Clinical Lists Changes  Medications: Removed medication of ACIPHEX 20 MG TBEC (RABEPRAZOLE SODIUM) 1 by mouth once daily Changed medication from OMEPRAZOLE 20 MG  CPDR (OMEPRAZOLE) 1 each day 30 minutes before meal to OMEPRAZOLE 20 MG  CPDR (OMEPRAZOLE) 1 each day 30 minutes before meal.. NEED OFFICE VISIT FOR FURTHER REFILLS - Signed Rx of OMEPRAZOLE 20 MG  CPDR (OMEPRAZOLE) 1 each day 30 minutes before meal.. NEED OFFICE VISIT FOR FURTHER REFILLS;  #30 x 0;  Signed;  Entered by: Ok Anis CMA;  Authorized by: Mardella Layman MD [facility];  Method used: Faxed to [facility], [phone] [place], [place], [place]  [address], Ph: [phone] or [phone], Fax: [phone]    Prescriptions: OMEPRAZOLE 20 MG  CPDR (OMEPRAZOLE) 1 each day 30 minutes before meal.. NEED OFFICE VISIT FOR FURTHER REFILLS  #30 x 0   Entered by:   Ok Anis CMA   Authorized by:   Mardella Layman MD [facility]   Signed by:   Ok Anis CMA on 12/30/2009   Method used:   Faxed to ...       Hospital doctor (retail)       [address]       [place], [place]  [address]       Ph: [phone] or [phone]       Fax: [phone]   RxID:   [id number]

## 2010-10-11 NOTE — Progress Notes (Signed)
Summary: Change meds  Phone Note From Pharmacy   Caller: [facility] Summary of Call: Pt states Prilosec is not working and wants to go back on aciphex. Initial call taken by: Ashok Cordia RN,  Jan 12, 2010 4:21 PM  Follow-up for Phone Call        RX sent,  will wait and see if insurance covers it. Follow-up by: Ashok Cordia RN,  Jan 12, 2010 4:22 PM    New/Updated Medications: ACIPHEX 20 MG  TBEC (RABEPRAZOLE SODIUM) Take 1 each day 30 minutes before meals Prescriptions: ACIPHEX 20 MG  TBEC (RABEPRAZOLE SODIUM) Take 1 each day 30 minutes before meals  #30 x 6   Entered by:   Ashok Cordia RN   Authorized by:   Mardella Layman MD [facility]   Signed by:   Ashok Cordia RN on 01/12/2010   Method used:   Print then Give to Patient   RxID:   [id number]   Appended Document: Change meds    Clinical Lists Changes  Medications: Rx of ACIPHEX 20 MG  TBEC (RABEPRAZOLE SODIUM) Take 1 each day 30 minutes before meals;  #30 x 6;  Signed;  Entered by: Ashok Cordia RN;  Authorized by: Mardella Layman MD [facility];  Method used: Faxed to [facility], [phone] [address], [place], [place], [place]  [address], Ph: [phone] or [phone], Fax: [phone]    Prescriptions: ACIPHEX 20 MG  TBEC (RABEPRAZOLE SODIUM) Take 1 each day 30 minutes before meals  #30 x 6   Entered by:   Ashok Cordia RN   Authorized by:   Mardella Layman MD [facility]   Signed by:   Ashok Cordia RN on 01/13/2010   Method used:   Faxed to ...       Hospital doctor (retail)       [address]       [place], [place]  [address]       Ph: [phone] or [phone]       Fax: [phone]   RxID:   [id number]

## 2010-10-11 NOTE — Letter (Signed)
Summary: Lipid Letter  [facility]  [address] [place], [place] [address]   Phone: [phone]  Fax: [phone]    05/11/2010  Davin Archuletta [address] [place], [place]  [address]  Dear Chrissie Noa:  We have carefully reviewed your last lipid profile from 05/11/2010 and the results are noted below with a summary of recommendations for lipid management.    Cholesterol:       144     Goal: <200   HDL "good" Cholesterol:   57.84     Goal: >40   LDL "bad" Cholesterol:   76     Goal: <160 excellent!!!   Triglycerides:       57.0     Goal: <150        TLC Diet (Therapeutic Lifestyle Change): Saturated Fats & Transfatty acids should be kept < 7% of total calories ***Reduce Saturated Fats Polyunstaurated Fat can be up to 10% of total calories Monounsaturated Fat Fat can be up to 20% of total calories Total Fat should be no greater than 25-35% of total calories Carbohydrates should be 50-60% of total calories Protein should be approximately 15% of total calories Fiber should be at least 20-30 grams a day ***Increased fiber may help lower LDL Total Cholesterol should be < 200mg /day Consider adding plant stanol/sterols to diet (example: Benacol spread) ***A higher intake of unsaturated fat may reduce Triglycerides and Increase HDL    Adjunctive Measures (may lower LIPIDS and reduce risk of Heart Attack) include: Aerobic Exercise (20-30 minutes 3-4 times a week) Limit Alcohol Consumption Weight Reduction Aspirin 75-81 mg a day by mouth (if not allergic or contraindicated) Dietary Fiber 20-30 grams a day by mouth     Current Medications: 1)    Bayer Low Strength 81 Mg Tbec (Aspirin) .... 2 qd 2)    Centrum  Tabs (Multiple vitamins-minerals) .... Take 1 tablet by mouth once a day 3)    Aciphex 20 Mg Tbec (Rabeprazole sodium) .... One by mouth once daily 4)    Crestor 20 Mg Tabs (Rosuvastatin calcium) .... One by mouth once daily  If you have any questions, please  call. We appreciate being able to work with you.   Sincerely,    [facility] Etta Grandchild MD

## 2011-03-28 ENCOUNTER — Encounter: Payer: Self-pay | Admitting: Endocrinology

## 2011-03-28 ENCOUNTER — Ambulatory Visit (INDEPENDENT_AMBULATORY_CARE_PROVIDER_SITE_OTHER): Payer: Medicare Other | Admitting: Endocrinology

## 2011-03-28 VITALS — BP 132/76 | HR 57 | Temp 98.3°F | Ht 67.0 in | Wt 143.1 lb

## 2011-03-28 DIAGNOSIS — IMO0002 Reserved for concepts with insufficient information to code with codable children: Secondary | ICD-10-CM

## 2011-03-28 LAB — POCT URINALYSIS DIPSTICK
Glucose, UA: NEGATIVE
Spec Grav, UA: 1.02

## 2011-03-28 MED ORDER — CIPROFLOXACIN HCL 500 MG PO TABS: 500.0000 mg | ORAL_TABLET | Freq: Two times a day (BID) | ORAL | Status: AC

## 2011-03-28 NOTE — Progress Notes (Signed)
  Subjective:    Patient ID: Don Johnson, male    DOB: [birth year], 70 y.o.   MRN: [id number]  HPI Pt states 2 days of slight pain at the perineal area, but no assoc hematuria.  No injury there.  He has chronic decreased urinary stream Past Medical History  Diagnosis Date  . HYPERLIPIDEMIA 09/28/2008  . ESOPHAGEAL STRICTURE 12/14/2008  . HIATAL HERNIA WITH REFLUX 12/14/2008  . DIVERTICULOSIS, COLON 12/14/2008  . HYPERTROPHY PROSTATE W/UR OBST & OTH LUTS 12/03/2008  . DEGENERATIVE JOINT DISEASE, HANDS 12/03/2008  . COLONIC POLYPS, ADENOMATOUS, HX OF 12/14/2008    Past Surgical History  Procedure Date  . Hemorroidectomy   . Inguinal hernia repair     History   Social History  . Marital Status: Married    Spouse Name: N/A    Number of Children: 1  . Years of Education: N/A   Occupational History  . Retired    Social History Main Topics  . Smoking status: Never Smoker   . Smokeless tobacco: Not on file  . Alcohol Use: No  . Drug Use: No  . Sexually Active:    Other Topics Concern  . Not on file   Social History Narrative   Regular exercise-yesDaily Caffeine Use-1 1/2 cup coffee in AMMarried, 1 boy    No current outpatient prescriptions on file prior to visit.    No Known Allergies  Family History  Problem Relation Age of Onset  . Diabetes Mother   . Colon polyps Mother   . Arthritis Other   . Hypertension Other   . Hyperlipidemia Other     BP 132/76  Pulse 57  Temp(Src) 98.3 F (36.8 C) (Oral)  Ht 5\' 7"  (1.702 m)  Wt 143 lb 1.9 oz (64.919 kg)  BMI 22.42 kg/m2  SpO2 97%  Review of Systems Denies fever and dysuria.      Objective:   Physical Exam GENERAL: no distress GENITALIA:  Normal male testicles, scrotum, and penis.    Labs:ua is neg Assessment & Plan:  Urinary sxs, new.  He should have a trial of abx.  If this does not help, a trial of flomax could be considered, or ref to [facility]

## 2011-03-28 NOTE — Patient Instructions (Signed)
i have sent a prescription to your pharmacy, for an antibiotic on a trial basis.   I hope you feel better soon.  If you don't feel better in a few days, please call doctor jones.

## 2011-04-12 ENCOUNTER — Other Ambulatory Visit: Payer: Self-pay | Admitting: Internal Medicine

## 2011-04-12 ENCOUNTER — Ambulatory Visit (INDEPENDENT_AMBULATORY_CARE_PROVIDER_SITE_OTHER): Payer: Medicare Other | Admitting: Internal Medicine

## 2011-04-12 ENCOUNTER — Encounter: Payer: Self-pay | Admitting: Internal Medicine

## 2011-04-12 ENCOUNTER — Other Ambulatory Visit (INDEPENDENT_AMBULATORY_CARE_PROVIDER_SITE_OTHER): Payer: Medicare Other

## 2011-04-12 VITALS — BP 122/78 | HR 54 | Temp 98.5°F | Resp 16 | Wt 141.0 lb

## 2011-04-12 DIAGNOSIS — N401 Enlarged prostate with lower urinary tract symptoms: Secondary | ICD-10-CM

## 2011-04-12 DIAGNOSIS — E785 Hyperlipidemia, unspecified: Secondary | ICD-10-CM

## 2011-04-12 DIAGNOSIS — Z0000 Encounter for general adult medical examination without abnormal findings: Secondary | ICD-10-CM

## 2011-04-12 LAB — LIPID PANEL
LDL Cholesterol: 122 mg/dL — ABNORMAL HIGH (ref 0–99)
Total CHOL/HDL Ratio: 3
Triglycerides: 37 mg/dL (ref 0.0–149.0)

## 2011-04-12 LAB — URINALYSIS, ROUTINE W REFLEX MICROSCOPIC
Bilirubin Urine: NEGATIVE
Hgb urine dipstick: NEGATIVE
Leukocytes, UA: NEGATIVE
Nitrite: NEGATIVE
Urobilinogen, UA: 0.2 (ref 0.0–1.0)

## 2011-04-12 LAB — CBC WITH DIFFERENTIAL/PLATELET
Basophils Absolute: 0 10*3/uL (ref 0.0–0.1)
Eosinophils Relative: 4 % (ref 0.0–5.0)
Hemoglobin: 14.5 g/dL (ref 13.0–17.0)
Lymphocytes Relative: 30.4 % (ref 12.0–46.0)
Monocytes Relative: 11.6 % (ref 3.0–12.0)
Neutro Abs: 3.1 10*3/uL (ref 1.4–7.7)
RBC: 4.74 Mil/uL (ref 4.22–5.81)
RDW: 13.4 % (ref 11.5–14.6)
WBC: 5.7 10*3/uL (ref 4.5–10.5)

## 2011-04-12 LAB — PSA: PSA: 0.35 ng/mL (ref 0.10–4.00)

## 2011-04-12 NOTE — Assessment & Plan Note (Signed)
I will check his FLP, TSH, and CMP today 

## 2011-04-12 NOTE — Assessment & Plan Note (Signed)
He has no s/s today, I will check his PSA

## 2011-04-12 NOTE — Assessment & Plan Note (Signed)
The patient is here for annual Medicare wellness examination and management of other chronic and acute problems.   The risk factors are reflected in the social history.  The roster of all physicians providing medical care to patient - is listed in the Snapshot section of the chart.  Activities of daily living:  The patient is 100% inedpendent in all ADLs: dressing, toileting, feeding as well as independent mobility  Home safety : The patient has smoke detectors in the home. They wear seatbelts.No firearms at home ( firearms are present in the home, kept in a safe fashion). There is no violence in the home.   There is no risks for hepatitis, STDs or HIV. There is no   history of blood transfusion. They have no travel history to infectious disease endemic areas of the world.  The patient has (has not) seen their dentist in the last six month. They have (not) seen their eye doctor in the last year. They deny (admit to) any hearing difficulty and have not had audiologic testing in the last year.  They do not  have excessive sun exposure. Discussed the need for sun protection: hats, long sleeves and use of sunscreen if there is significant sun exposure.   Diet: the importance of a healthy diet is discussed. They do have a healthy (unhealthy-high fat/fast food) diet.  The patient has a regular exercise program: 20 ,  Minutes duration, 3 times per week.  The benefits of regular aerobic exercise were discussed.  Depression screen: there are no signs or vegative symptoms of depression- irritability, change in appetite, anhedonia, sadness/tearfullness.  Cognitive assessment: the patient manages all their financial and personal affairs and is actively engaged. They could relate day,date,year and events; recalled 3/3 objects at 3 minutes; performed clock-face test normally.  The following portions of the patient's history were reviewed and updated as appropriate: allergies, current medications, past family  history, past medical history,  past surgical history, past social history  and problem list.  Vision, hearing, body mass index were assessed and reviewed.   During the course of the visit the patient was educated and counseled about appropriate screening and preventive services including : fall prevention , diabetes screening, nutrition counseling, colorectal cancer screening, and recommended immunizations.

## 2011-04-12 NOTE — Progress Notes (Signed)
Subjective:    Patient ID: Don Johnson, male    DOB: 11/20/1940, 70 y.o.   MRN: [id number]  Hyperlipidemia This is a chronic problem. The current episode started more than 1 year ago. The problem is controlled. Recent lipid tests were reviewed and are variable. He has no history of chronic renal disease, diabetes, hypothyroidism, liver disease, obesity or nephrotic syndrome. Factors aggravating his hyperlipidemia include no known factors. Pertinent negatives include no chest pain, focal sensory loss, focal weakness, leg pain, myalgias or shortness of breath. Current antihyperlipidemic treatment includes statins. The current treatment provides significant improvement of lipids. There are no compliance problems.       Review of Systems  Constitutional: Negative.  Negative for fever, chills, diaphoresis, activity change, appetite change, fatigue and unexpected weight change.  HENT: Negative.   Eyes: Negative.   Respiratory: Negative for apnea, cough, choking, chest tightness, shortness of breath, wheezing and stridor.   Cardiovascular: Negative for chest pain, palpitations and leg swelling.  Gastrointestinal: Negative for nausea, vomiting, abdominal pain, diarrhea, constipation, blood in stool, abdominal distention, anal bleeding and rectal pain.  Genitourinary: Negative for dysuria, urgency, frequency, hematuria, flank pain, decreased urine volume, discharge, penile swelling, scrotal swelling, enuresis, difficulty urinating, genital sores, penile pain and testicular pain.  Musculoskeletal: Negative for myalgias, back pain, joint swelling, arthralgias and gait problem.  Skin: Negative for color change, pallor, rash and wound.  Neurological: Negative for dizziness, tremors, focal weakness, seizures, syncope, facial asymmetry, speech difficulty, weakness, light-headedness, numbness and headaches.  Hematological: Negative for adenopathy. Does not bruise/bleed easily.  Psychiatric/Behavioral:  Negative.        Objective:   Physical Exam  Vitals reviewed. Constitutional: He is oriented to person, place, and time. He appears well-developed and well-nourished. No distress.  HENT:  Head: Normocephalic and atraumatic.  Right Ear: External ear normal.  Left Ear: External ear normal.  Nose: Nose normal.  Mouth/Throat: Oropharynx is clear and moist. No oropharyngeal exudate.  Eyes: Conjunctivae and EOM are normal. Pupils are equal, round, and reactive to light. Right eye exhibits no discharge. Left eye exhibits no discharge. No scleral icterus.  Neck: Normal range of motion. Neck supple. No JVD present. No tracheal deviation present. No thyromegaly present.  Cardiovascular: Normal rate, regular rhythm, normal heart sounds and intact distal pulses.  Exam reveals no gallop and no friction rub.   No murmur heard. Pulmonary/Chest: Effort normal and breath sounds normal. No stridor. No respiratory distress. He has no wheezes. He has no rales. He exhibits no tenderness.  Abdominal: Soft. Bowel sounds are normal. He exhibits no distension and no mass. There is no tenderness. There is no rebound and no guarding. Hernia confirmed negative in the right inguinal area and confirmed negative in the left inguinal area.  Genitourinary: Prostate normal, testes normal and penis normal. Rectal exam shows external hemorrhoid. Rectal exam shows no internal hemorrhoid, no fissure, no mass, no tenderness and anal tone normal. Guaiac negative stool. Prostate is not enlarged and not tender. Right testis shows no mass, no swelling and no tenderness. Right testis is descended. Cremasteric reflex is not absent on the right side. Left testis shows no mass, no swelling and no tenderness. Left testis is descended. Cremasteric reflex is not absent on the left side. Uncircumcised. No phimosis, paraphimosis, hypospadias, penile erythema or penile tenderness. No discharge found.  Musculoskeletal: Normal range of motion. He  exhibits no edema and no tenderness.  Lymphadenopathy:    He has no cervical adenopathy.  Right: No inguinal adenopathy present.       Left: No inguinal adenopathy present.  Neurological: He is alert and oriented to person, place, and time. He has normal reflexes. He displays normal reflexes. No cranial nerve deficit. He exhibits normal muscle tone. Coordination normal.  Skin: Skin is warm and dry. No rash noted. He is not diaphoretic. No erythema. No pallor.  Psychiatric: He has a normal mood and affect. His behavior is normal. Judgment and thought content normal.      Lab Results  Component Value Date   WBC 7.1 02/01/2010   HGB 15.0 02/01/2010   HCT 43.3 02/01/2010   PLT 227.0 02/01/2010   CHOL 144 05/11/2010   TRIG 57.0 05/11/2010   HDL 56.70 05/11/2010   LDLDIRECT 208.6 02/01/2010   ALT 29 05/11/2010   AST 28 05/11/2010   NA 141 02/01/2010   K 4.6 02/01/2010   CL 106 02/01/2010   CREATININE 0.9 02/01/2010   BUN 21 02/01/2010   CO2 30 02/01/2010   TSH 1.83 02/01/2010   PSA 0.48 02/01/2010      Assessment & Plan:

## 2011-04-12 NOTE — Patient Instructions (Signed)
Health Maintenance in Males MAINTAIN REGULAR HEALTH EXAMS  Maintain a healthy diet and normal weight. Increased weight leads to problems with blood pressure and diabetes. Decrease fat in the diet and increase exercise. Obtain a proper diet from your caregiver if necessary.   High blood pressure causes heart and blood vessel problems. Check blood pressures regularly and keep your blood pressure at normal limits. Aerobic exercise helps this. Persistent elevations of blood pressure should be treated with medications if weight loss and exercise are ineffective.   Avoid smoking, drinking in excess (more than 2 drinks per day), or use of street drugs. Do not share needles with anyone. Ask for help if you need assistance or instructions on stopping the use of alcohol, cigarettes, or drugs.   Maintain normal blood lipids and cholesterol. Your caregiver can give you information to lower your risk of heart disease or stroke.   Ask your caregiver if you are in need of early heart disease screening because of a strong family history of heart disease or signs of elevated testosterone (male sex hormone) levels. These can predispose you to early heart disease.   Practice safe sex. Practicing safe sex decreases your risk for a sexually transmitted infection (STI). Some of the STIs are gonorrhea, chlamydia, syphilis, trichimonas, herpes, human papillomavirus (HPV), and human immunodeficiency virus (HIV). Herpes, HIV, and HPV are viral illnesses that have no cure. These can result in disability, cancer, and death.   It is not safe for someone who has AIDS or is HIV positive to have unprotected sex with a partner who is HIV positive. The reason for this is the fact that there are many different strains of HIV. If you have a strain that is readily treated with medications and then suddenly introduce a strain from a partner that has no further treatment options, you may suddenly have a strain of HIV that is untreatable.  Even if you are both positive for HIV, it is still necessary to practice safe sex.   Use sunscreen with a SPF of 15 or greater. Being outside in the sun when your shadow caused by the sun is shorter than you are, means you are being exposed to sun at greater intensity. Lighter skinned people are at a greater risk of skin cancer.   Keep carbon monoxide and smoke detectors in your home and functioning at all times. Change the batteries every 6 months.   Do monthly examinations of your testicles. The best time to do this is after a hot shower or bath when the tissues are loose. Notify your caregivers of any lumps, tenderness, or changes in size or shape.   Notify your caregiver of new moles or changes in moles, especially if there is a change in shape or color. Also notify your caregiver if a mole is larger than the size of a pencil eraser.   Stay current with your tetanus shots and other required immunizations.  The Body Mass Index (BMI) is a way of measuring how much of your body is fat. Having a BMI above 27 increases the risk of heart disease, diabetes, hypertension, stroke, and other problems related to obesity. Document Released: 02/24/2008 Document Re-Released: 02/15/2010 Exit[employer] Patient Information 2011 [employer]. 

## 2011-04-13 ENCOUNTER — Encounter: Payer: Self-pay | Admitting: Internal Medicine

## 2011-04-13 LAB — COMPREHENSIVE METABOLIC PANEL
ALT: 28 U/L (ref 0–53)
CO2: 30 mEq/L (ref 19–32)
Calcium: 8.9 mg/dL (ref 8.4–10.5)
Chloride: 103 mEq/L (ref 96–112)
Creatinine, Ser: 0.9 mg/dL (ref 0.4–1.5)
GFR: 94.62 mL/min (ref >60.00)
Sodium: 139 mEq/L (ref 135–145)
Total Protein: 6.8 g/dL (ref 6.0–8.3)

## 2011-07-10 ENCOUNTER — Ambulatory Visit (INDEPENDENT_AMBULATORY_CARE_PROVIDER_SITE_OTHER): Payer: Medicare Other | Admitting: *Deleted

## 2011-07-10 DIAGNOSIS — Z23 Encounter for immunization: Secondary | ICD-10-CM

## 2011-10-16 DIAGNOSIS — M2040 Other hammer toe(s) (acquired), unspecified foot: Secondary | ICD-10-CM | POA: Diagnosis not present

## 2012-01-03 ENCOUNTER — Encounter: Payer: Self-pay | Admitting: Gastroenterology

## 2012-02-13 ENCOUNTER — Encounter: Payer: Self-pay | Admitting: Gastroenterology

## 2012-02-28 ENCOUNTER — Ambulatory Visit (AMBULATORY_SURGERY_CENTER): Payer: Medicare Other | Admitting: *Deleted

## 2012-02-28 VITALS — Ht 67.0 in | Wt 142.2 lb

## 2012-02-28 DIAGNOSIS — Z1211 Encounter for screening for malignant neoplasm of colon: Secondary | ICD-10-CM

## 2012-02-28 MED ORDER — MOVIPREP 100 G PO SOLR: ORAL | Status: DC

## 2012-03-13 ENCOUNTER — Encounter: Payer: Self-pay | Admitting: Gastroenterology

## 2012-03-13 ENCOUNTER — Ambulatory Visit (AMBULATORY_SURGERY_CENTER): Payer: Medicare Other | Admitting: Gastroenterology

## 2012-03-13 VITALS — BP 136/88 | HR 70 | Temp 95.6°F | Resp 20 | Ht 67.0 in | Wt 142.0 lb

## 2012-03-13 DIAGNOSIS — K5730 Diverticulosis of large intestine without perforation or abscess without bleeding: Secondary | ICD-10-CM | POA: Diagnosis not present

## 2012-03-13 DIAGNOSIS — Z86010 Personal history of colonic polyps: Secondary | ICD-10-CM | POA: Diagnosis not present

## 2012-03-13 DIAGNOSIS — Z1211 Encounter for screening for malignant neoplasm of colon: Secondary | ICD-10-CM

## 2012-03-13 MED ORDER — SODIUM CHLORIDE 0.9 % IV SOLN: 500.0000 mL | INTRAVENOUS | Status: DC

## 2012-03-13 NOTE — Progress Notes (Signed)
No complaints noted in the recovery room. Maw   

## 2012-03-13 NOTE — Progress Notes (Signed)
Patient did not have preoperative order for IV antibiotic SSI prophylaxis. (G8918)  Patient did not experience any of the following events: a burn prior to discharge; a fall within the facility; wrong site/side/patient/procedure/implant event; or a hospital transfer or hospital admission upon discharge from the facility. (G8907)  

## 2012-03-13 NOTE — Op Note (Signed)
[facility] [address] [place], [place]  [address]  COLONOSCOPY PROCEDURE REPORT  PATIENT:  Don Johnson, Don Johnson  MR#:  [id number] BIRTHDATE:  [birth year], 71 yrs. old  GENDER:  male ENDOSCOPIST:  Vania Rea. Jarold Motto, MD, [facility] REF. BY:  Etta Grandchild, M.D. PROCEDURE DATE:  03/13/2012 PROCEDURE:  Diagnostic Colonoscopy ASA CLASS:  Class II INDICATIONS:  history of pre-cancerous (adenomatous) colon polyps  MEDICATIONS:   propofol (Diprivan) 300 mg IV  DESCRIPTION OF PROCEDURE:   After the risks and benefits and of the procedure were explained, informed consent was obtained. Digital rectal exam was performed and revealed no abnormalities. The LB [id number] endoscope was introduced through the anus and advanced to the cecum, which was identified by both the appendix and ileocecal valve.  The quality of the prep was excellent, using MoviPrep.  The instrument was then slowly withdrawn as the colon was fully examined. <<PROCEDUREIMAGES>>  FINDINGS:  There were mild diverticular changes in left colon. diverticulosis was found.  Scattered diverticula were found in the right colon.  No polyps or cancers were seen.  This was otherwise a normal examination of the colon.   Retroflexed views in the rectum revealed no abnormalities.    The scope was then withdrawn from the patient and the procedure completed.  COMPLICATIONS:  None ENDOSCOPIC IMPRESSION: 1) Diverticulosis,mild,left sided diverticulosis 2) Diverticula, scattered in the right colon 3) No polyps or cancers 4) Otherwise normal examination RECOMMENDATIONS: 1) Repeat Colonoscopy in 5 years. 2) High fiber diet  REPEAT EXAM:  No  ______________________________ Vania Rea. Jarold Motto, MD, [facility]  CC:  n. eSIGNED:   Vania Rea. Sahily Biddle at 03/13/2012 10:10 AM  Mallie Darting, [id number]

## 2012-03-13 NOTE — Progress Notes (Addendum)
Propofol given and oxygen managed per D Merritt CRNA  Abdominal pressure applied to reach the cecum

## 2012-03-13 NOTE — Patient Instructions (Addendum)
Handouts were given to your care partner on diverticulosis and high fiber diet.  You may resume your prior medications today.  Please call if you have any questions or concerns.    YOU HAD AN ENDOSCOPIC PROCEDURE TODAY AT THE [facility]: Refer to the procedure report that was given to you for any specific questions about what was found during the examination.  If the procedure report does not answer your questions, please call your gastroenterologist to clarify.  If you requested that your care partner not be given the details of your procedure findings, then the procedure report has been included in a sealed envelope for you to review at your convenience later.  YOU SHOULD EXPECT: Some feelings of bloating in the abdomen. Passage of more gas than usual.  Walking can help get rid of the air that was put into your GI tract during the procedure and reduce the bloating. If you had a lower endoscopy (such as a colonoscopy or flexible sigmoidoscopy) you may notice spotting of blood in your stool or on the toilet paper. If you underwent a bowel prep for your procedure, then you may not have a normal bowel movement for a few days.  DIET: Your first meal following the procedure should be a light meal and then it is ok to progress to your normal diet.  A half-sandwich or bowl of soup is an example of a good first meal.  Heavy or fried foods are harder to digest and may make you feel nauseous or bloated.  Likewise meals heavy in dairy and vegetables can cause extra gas to form and this can also increase the bloating.  Drink plenty of fluids but you should avoid alcoholic beverages for 24 hours.  ACTIVITY: Your care partner should take you home directly after the procedure.  You should plan to take it easy, moving slowly for the rest of the day.  You can resume normal activity the day after the procedure however you should NOT DRIVE or use heavy machinery for 24 hours (because of the sedation medicines  used during the test).    SYMPTOMS TO REPORT IMMEDIATELY: A gastroenterologist can be reached at any hour.  During normal business hours, 8:30 AM to 5:00 PM Monday through Friday, call [phone].  After hours and on weekends, please call the GI answering service at [phone] who will take a message and have the physician on call contact you.   Following lower endoscopy (colonoscopy or flexible sigmoidoscopy):  Excessive amounts of blood in the stool  Significant tenderness or worsening of abdominal pains  Swelling of the abdomen that is new, acute  Fever of 100F or higher    FOLLOW UP: If any biopsies were taken you will be contacted by phone or by letter within the next 1-3 weeks.  Call your gastroenterologist if you have not heard about the biopsies in 3 weeks.  Our staff will call the home number listed on your records the next business day following your procedure to check on you and address any questions or concerns that you may have at that time regarding the information given to you following your procedure. This is a courtesy call and so if there is no answer at the home number and we have not heard from you through the emergency physician on call, we will assume that you have returned to your regular daily activities without incident.  SIGNATURES/CONFIDENTIALITY: You and/or your care partner have signed paperwork which will be entered   into your electronic medical record.  These signatures attest to the fact that that the information above on your After Visit Summary has been reviewed and is understood.  Full responsibility of the confidentiality of this discharge information lies with you and/or your care-partner.  

## 2012-03-15 ENCOUNTER — Telehealth: Payer: Self-pay | Admitting: *Deleted

## 2012-03-15 NOTE — Telephone Encounter (Signed)
  Follow up Call-  Call back number 03/13/2012  Post procedure Call Back phone  # [phone]  Permission to leave phone message Yes     Patient questions:  Do you have a fever, pain , or abdominal swelling? no Pain Score  0 *  Have you tolerated food without any problems? yes  Have you been able to return to your normal activities? yes  Do you have any questions about your discharge instructions: Diet   no Medications  no Follow up visit  no  Do you have questions or concerns about your Care? no  Actions: * If pain score is 4 or above: No action needed, pain <4.

## 2012-04-01 LAB — HM COLONOSCOPY

## 2012-04-08 ENCOUNTER — Ambulatory Visit (INDEPENDENT_AMBULATORY_CARE_PROVIDER_SITE_OTHER): Payer: Medicare Other | Admitting: Internal Medicine

## 2012-04-08 ENCOUNTER — Encounter: Payer: Self-pay | Admitting: Internal Medicine

## 2012-04-08 VITALS — BP 128/74 | HR 62 | Temp 97.6°F | Resp 16 | Wt 140.8 lb

## 2012-04-08 DIAGNOSIS — L255 Unspecified contact dermatitis due to plants, except food: Secondary | ICD-10-CM

## 2012-04-08 DIAGNOSIS — L237 Allergic contact dermatitis due to plants, except food: Secondary | ICD-10-CM

## 2012-04-08 MED ORDER — FLUOCINONIDE-E 0.05 % EX CREA: TOPICAL_CREAM | Freq: Two times a day (BID) | CUTANEOUS | Status: AC

## 2012-04-08 MED ORDER — METHYLPREDNISOLONE ACETATE 80 MG/ML IJ SUSP
120.0000 mg | Freq: Once | INTRAMUSCULAR | Status: AC
  Administered 2012-04-08: 120 mg via INTRAMUSCULAR

## 2012-04-08 NOTE — Addendum Note (Signed)
Addended by: Rock Nephew T on: 04/08/2012 04:47 PM   Modules accepted: Orders

## 2012-04-08 NOTE — Progress Notes (Signed)
  Subjective:    Patient ID: Don Johnson, male    DOB: 1941-03-17, 71 y.o.   MRN: [id number]  Poison [place] This is a new problem. The current episode started in the past 7 days. The problem has been gradually worsening since onset. The affected locations include the right arm. The rash is characterized by itchiness, dryness and swelling. He was exposed to plant contact. Pertinent negatives include no anorexia, congestion, cough, diarrhea, eye pain, facial edema, fatigue, fever, joint pain, nail changes, rhinorrhea, shortness of breath, sore throat or vomiting. Past treatments include antihistamine. The treatment provided mild relief.      Review of Systems  Constitutional: Negative.  Negative for fever and fatigue.  HENT: Negative.  Negative for congestion, sore throat and rhinorrhea.   Eyes: Negative.  Negative for pain.  Respiratory: Negative.  Negative for cough and shortness of breath.   Cardiovascular: Negative.   Gastrointestinal: Negative.  Negative for vomiting, diarrhea and anorexia.  Genitourinary: Negative.   Musculoskeletal: Negative.  Negative for joint pain.  Skin: Positive for rash. Negative for nail changes, color change, pallor and wound.  Neurological: Negative.   Hematological: Negative.   Psychiatric/Behavioral: Negative.        Objective:   Physical Exam  Vitals reviewed. Constitutional: He is oriented to person, place, and time. He appears well-developed and well-nourished. No distress.  HENT:  Head: Normocephalic and atraumatic.  Mouth/Throat: Oropharynx is clear and moist. No oropharyngeal exudate.  Eyes: Conjunctivae are normal. Right eye exhibits no discharge. Left eye exhibits no discharge. No scleral icterus.  Neck: Normal range of motion. Neck supple. No JVD present. No tracheal deviation present. No thyromegaly present.  Cardiovascular: Normal rate, regular rhythm, normal heart sounds and intact distal pulses.  Exam reveals no gallop and no friction  rub.   No murmur heard. Pulmonary/Chest: Effort normal and breath sounds normal. No stridor. No respiratory distress. He has no wheezes. He has no rales. He exhibits no tenderness.  Abdominal: Soft. Bowel sounds are normal. He exhibits no distension and no mass. There is no tenderness. There is no rebound and no guarding.  Musculoskeletal: Normal range of motion. He exhibits no tenderness.  Lymphadenopathy:    He has no cervical adenopathy.  Neurological: He is oriented to person, place, and time.  Skin: Skin is warm, dry and intact. Rash noted. No abrasion, no bruising, no burn, no ecchymosis, no laceration, no lesion, no petechiae and no purpura noted. Rash is papular and vesicular. Rash is not macular, not maculopapular, not nodular, not pustular and not urticarial. He is not diaphoretic. No cyanosis or erythema. No pallor. Nails show no clubbing.          Right forearm shows a large area of erythematous papules in streaks and groups with mild vesicle formation and oozing, there is no induration/purulent exudate/warmth/streaking/fluctuance  Psychiatric: He has a normal mood and affect. His behavior is normal. Judgment and thought content normal.          Assessment & Plan:

## 2012-04-08 NOTE — Patient Instructions (Signed)
Poison Ivy Poison ivy is a inflammation of the skin (contact dermatitis) caused by touching the allergens on the leaves of the ivy plant following previous exposure to the plant. The rash usually appears 48 hours after exposure. The rash is usually bumps (papules) or blisters (vesicles) in a linear pattern. Depending on your own sensitivity, the rash may simply cause redness and itching, or it may also progress to blisters which may break open. These must be well cared for to prevent secondary bacterial (germ) infection, followed by scarring. Keep any open areas dry, clean, dressed, and covered with an antibacterial ointment if needed. The eyes may also get puffy. The puffiness is worst in the morning and gets better as the day progresses. This dermatitis usually heals without scarring, within 2 to 3 weeks without treatment. HOME CARE INSTRUCTIONS  Thoroughly wash with soap and water as soon as you have been exposed to poison ivy. You have about one half hour to remove the plant resin before it will cause the rash. This washing will destroy the oil or antigen on the skin that is causing, or will cause, the rash. Be sure to wash under your fingernails as any plant resin there will continue to spread the rash. Do not rub skin vigorously when washing affected area. Poison ivy cannot spread if no oil from the plant remains on your body. A rash that has progressed to weeping sores will not spread the rash unless you have not washed thoroughly. It is also important to wash any clothes you have been wearing as these may carry active allergens. The rash will return if you wear the unwashed clothing, even several days later. Avoidance of the plant in the future is the best measure. Poison ivy plant can be recognized by the number of leaves. Generally, poison ivy has three leaves with flowering branches on a single stem. Diphenhydramine may be purchased over the counter and used as needed for itching. Do not drive with  this medication if it makes you drowsy.Ask your caregiver about medication for children. SEEK MEDICAL CARE IF:  Open sores develop.   Redness spreads beyond area of rash.   You notice purulent (pus-like) discharge.   You have increased pain.   Other signs of infection develop (such as fever).  Document Released: 08/25/2000 Document Revised: 08/17/2011 Document Reviewed: 07/14/2009 Exit[employer] Patient Information 2012 [employer]. 

## 2012-04-08 NOTE — Assessment & Plan Note (Signed)
He is having significant allergic symptoms so I gave him an injection of depo-medrol IM and started Lidex cream, he will continue taking benadryl for the itching

## 2012-06-27 ENCOUNTER — Other Ambulatory Visit (INDEPENDENT_AMBULATORY_CARE_PROVIDER_SITE_OTHER): Payer: Medicare Other

## 2012-06-27 ENCOUNTER — Encounter: Payer: Self-pay | Admitting: Internal Medicine

## 2012-06-27 ENCOUNTER — Ambulatory Visit (INDEPENDENT_AMBULATORY_CARE_PROVIDER_SITE_OTHER): Payer: Medicare Other | Admitting: Internal Medicine

## 2012-06-27 VITALS — BP 110/72 | HR 53 | Temp 97.6°F | Resp 16 | Wt 138.5 lb

## 2012-06-27 DIAGNOSIS — Z136 Encounter for screening for cardiovascular disorders: Secondary | ICD-10-CM | POA: Diagnosis not present

## 2012-06-27 DIAGNOSIS — Z0000 Encounter for general adult medical examination without abnormal findings: Secondary | ICD-10-CM

## 2012-06-27 DIAGNOSIS — Z23 Encounter for immunization: Secondary | ICD-10-CM | POA: Diagnosis not present

## 2012-06-27 DIAGNOSIS — E785 Hyperlipidemia, unspecified: Secondary | ICD-10-CM

## 2012-06-27 DIAGNOSIS — N401 Enlarged prostate with lower urinary tract symptoms: Secondary | ICD-10-CM

## 2012-06-27 LAB — COMPREHENSIVE METABOLIC PANEL
ALT: 24 U/L (ref 0–53)
AST: 28 U/L (ref 0–37)
Alkaline Phosphatase: 57 U/L (ref 39–117)
CO2: 32 mEq/L (ref 19–32)
Creatinine, Ser: 1 mg/dL (ref 0.4–1.5)
GFR: 80.97 mL/min (ref >60.00)
Sodium: 141 mEq/L (ref 135–145)
Total Bilirubin: 0.8 mg/dL (ref 0.3–1.2)
Total Protein: 7 g/dL (ref 6.0–8.3)

## 2012-06-27 LAB — CBC WITH DIFFERENTIAL/PLATELET
Basophils Absolute: 0.1 10*3/uL (ref 0.0–0.1)
Eosinophils Absolute: 0.3 10*3/uL (ref 0.0–0.7)
HCT: 46.6 % (ref 39.0–52.0)
Hemoglobin: 15.3 g/dL (ref 13.0–17.0)
Lymphs Abs: 2.1 10*3/uL (ref 0.7–4.0)
MCHC: 32.9 g/dL (ref 30.0–36.0)
MCV: 93 fl (ref 78.0–100.0)
Monocytes Absolute: 0.9 10*3/uL (ref 0.1–1.0)
Monocytes Relative: 13.1 % — ABNORMAL HIGH (ref 3.0–12.0)
Neutro Abs: 3.7 10*3/uL (ref 1.4–7.7)
Platelets: 248 10*3/uL (ref 150.0–400.0)
RDW: 13.8 % (ref 11.5–14.6)

## 2012-06-27 LAB — LIPID PANEL
Cholesterol: 248 mg/dL — ABNORMAL HIGH (ref 0–200)
Total CHOL/HDL Ratio: 4
Triglycerides: 64 mg/dL (ref 0.0–149.0)
VLDL: 12.8 mg/dL (ref 0.0–40.0)

## 2012-06-27 LAB — LDL CHOLESTEROL, DIRECT: Direct LDL: 177.9 mg/dL

## 2012-06-27 NOTE — Assessment & Plan Note (Signed)
FLP today 

## 2012-06-27 NOTE — Assessment & Plan Note (Signed)

## 2012-06-27 NOTE — Assessment & Plan Note (Signed)
PSA today

## 2012-06-27 NOTE — Progress Notes (Signed)
Subjective:    Patient ID: Don Johnson, male    DOB: 02-27-1941, 71 y.o.   MRN: [id number]  Hyperlipidemia This is a chronic problem. The current episode started more than 1 year ago. The problem is uncontrolled. Recent lipid tests were reviewed and are variable. He has no history of chronic renal disease, diabetes, hypothyroidism, liver disease, obesity or nephrotic syndrome. There are no known factors aggravating his hyperlipidemia. Pertinent negatives include no chest pain, focal sensory loss, focal weakness, leg pain, myalgias or shortness of breath. He is currently on no antihyperlipidemic treatment. The current treatment provides no improvement of lipids. There are no compliance problems.       Review of Systems  Constitutional: Negative for fever, chills, diaphoresis, activity change, appetite change, fatigue and unexpected weight change.  HENT: Negative.   Eyes: Negative.   Respiratory: Negative for apnea, cough, choking, chest tightness, shortness of breath, wheezing and stridor.   Cardiovascular: Negative for chest pain, palpitations and leg swelling.  Gastrointestinal: Negative for nausea, vomiting, abdominal pain, diarrhea, constipation and anal bleeding.  Genitourinary: Negative for dysuria, urgency, frequency, hematuria, flank pain, decreased urine volume, discharge, penile swelling, scrotal swelling, enuresis, difficulty urinating, genital sores, penile pain and testicular pain.  Musculoskeletal: Negative for myalgias, back pain, joint swelling, arthralgias and gait problem.  Skin: Negative for color change, pallor, rash and wound.  Neurological: Negative.  Negative for focal weakness.  Hematological: Negative for adenopathy. Does not bruise/bleed easily.  Psychiatric/Behavioral: Negative.        Objective:   Physical Exam  Vitals reviewed. Constitutional: He is oriented to person, place, and time. He appears well-developed and well-nourished. No distress.  HENT:    Head: Normocephalic and atraumatic.  Mouth/Throat: Oropharynx is clear and moist. No oropharyngeal exudate.  Eyes: Conjunctivae normal are normal. Right eye exhibits no discharge. Left eye exhibits no discharge. No scleral icterus.  Neck: Normal range of motion. Neck supple. No JVD present. No tracheal deviation present. No thyromegaly present.  Cardiovascular: Normal rate, regular rhythm, normal heart sounds and intact distal pulses.  Exam reveals no gallop and no friction rub.   No murmur heard. Pulmonary/Chest: Effort normal and breath sounds normal. No stridor. No respiratory distress. He has no wheezes. He has no rales. He exhibits no tenderness.  Abdominal: Soft. Bowel sounds are normal. He exhibits no distension and no mass. There is no tenderness. There is no rebound and no guarding. Hernia confirmed negative in the right inguinal area and confirmed negative in the left inguinal area.  Genitourinary: Testes normal and penis normal. Rectal exam shows external hemorrhoid and internal hemorrhoid. Rectal exam shows no fissure, no mass, no tenderness and anal tone normal. Guaiac negative stool. Prostate is enlarged (1+ smooth symmetrical BPH - no nodules). Prostate is not tender. Right testis shows no mass, no swelling and no tenderness. Right testis is descended. Left testis shows no mass, no swelling and no tenderness. Left testis is descended. Uncircumcised. No phimosis, paraphimosis, hypospadias, penile erythema or penile tenderness. No discharge found.  Musculoskeletal: Normal range of motion. He exhibits no edema and no tenderness.  Lymphadenopathy:    He has no cervical adenopathy.       Right: No inguinal adenopathy present.       Left: No inguinal adenopathy present.  Neurological: He is oriented to person, place, and time.  Skin: Skin is warm and dry. No rash noted. He is not diaphoretic. No erythema. No pallor.  Psychiatric: He has a normal mood and  affect. His behavior is normal.  Judgment and thought content normal.      Lab Results  Component Value Date   WBC 5.7 04/12/2011   HGB 14.5 04/12/2011   HCT 43.5 04/12/2011   PLT 200.0 04/12/2011   GLUCOSE 87 04/12/2011   CHOL 195 04/12/2011   TRIG 37.0 04/12/2011   HDL 65.30 04/12/2011   LDLDIRECT 208.6 02/01/2010   LDLCALC 122* 04/12/2011   ALT 28 04/12/2011   AST 30 04/12/2011   NA 139 04/12/2011   K 4.1 04/12/2011   CL 103 04/12/2011   CREATININE 0.9 04/12/2011   BUN 26* 04/12/2011   CO2 30 04/12/2011   TSH 1.79 04/12/2011   PSA 0.35 04/12/2011      Assessment & Plan:

## 2012-06-27 NOTE — Patient Instructions (Signed)
Health Maintenance, Males A healthy lifestyle and preventative care can promote health and wellness.  Maintain regular health, dental, and eye exams.  Eat a healthy diet. Foods like vegetables, fruits, whole grains, low-fat dairy products, and lean protein foods contain the nutrients you need without too many calories. Decrease your intake of foods high in solid fats, added sugars, and salt. Get information about a proper diet from your caregiver, if necessary.  Regular physical exercise is one of the most important things you can do for your health. Most adults should get at least 150 minutes of moderate-intensity exercise (any activity that increases your heart rate and causes you to sweat) each week. In addition, most adults need muscle-strengthening exercises on 2 or more days a week.   Maintain a healthy weight. The body mass index (BMI) is a screening tool to identify possible weight problems. It provides an estimate of body fat based on height and weight. Your caregiver can help determine your BMI, and can help you achieve or maintain a healthy weight. For adults 20 years and older:  A BMI below 18.5 is considered underweight.  A BMI of 18.5 to 24.9 is normal.  A BMI of 25 to 29.9 is considered overweight.  A BMI of 30 and above is considered obese.  Maintain normal blood lipids and cholesterol by exercising and minimizing your intake of saturated fat. Eat a balanced diet with plenty of fruits and vegetables. Blood tests for lipids and cholesterol should begin at age 20 and be repeated every 5 years. If your lipid or cholesterol levels are high, you are over 50, or you are a high risk for heart disease, you may need your cholesterol levels checked more frequently.Ongoing high lipid and cholesterol levels should be treated with medicines, if diet and exercise are not effective.  If you smoke, find out from your caregiver how to quit. If you do not use tobacco, do not start.  If you  choose to drink alcohol, do not exceed 2 drinks per day. One drink is considered to be 12 ounces (355 mL) of beer, 5 ounces (148 mL) of wine, or 1.5 ounces (44 mL) of liquor.  Avoid use of street drugs. Do not share needles with anyone. Ask for help if you need support or instructions about stopping the use of drugs.  High blood pressure causes heart disease and increases the risk of stroke. Blood pressure should be checked at least every 1 to 2 years. Ongoing high blood pressure should be treated with medicines if weight loss and exercise are not effective.  If you are 45 to 71 years old, ask your caregiver if you should take aspirin to prevent heart disease.  Diabetes screening involves taking a blood sample to check your fasting blood sugar level. This should be done once every 3 years, after age 45, if you are within normal weight and without risk factors for diabetes. Testing should be considered at a younger age or be carried out more frequently if you are overweight and have at least 1 risk factor for diabetes.  Colorectal cancer can be detected and often prevented. Most routine colorectal cancer screening begins at the age of 50 and continues through age 75. However, your caregiver may recommend screening at an earlier age if you have risk factors for colon cancer. On a yearly basis, your caregiver may provide home test kits to check for hidden blood in the stool. Use of a small camera at the end of a tube,   to directly examine the colon (sigmoidoscopy or colonoscopy), can detect the earliest forms of colorectal cancer. Talk to your caregiver about this at age 50, when routine screening begins. Direct examination of the colon should be repeated every 5 to 10 years through age 75, unless early forms of pre-cancerous polyps or small growths are found.  Hepatitis C blood testing is recommended for all people born from 1945 through 1965 and any individual with known risks for hepatitis C.  Healthy  men should no longer receive prostate-specific antigen (PSA) blood tests as part of routine cancer screening. Consult with your caregiver about prostate cancer screening.  Testicular cancer screening is not recommended for adolescents or adult males who have no symptoms. Screening includes self-exam, caregiver exam, and other screening tests. Consult with your caregiver about any symptoms you have or any concerns you have about testicular cancer.  Practice safe sex. Use condoms and avoid high-risk sexual practices to reduce the spread of sexually transmitted infections (STIs).  Use sunscreen with a sun protection factor (SPF) of 30 or greater. Apply sunscreen liberally and repeatedly throughout the day. You should seek shade when your shadow is shorter than you. Protect yourself by wearing long sleeves, pants, a wide-brimmed hat, and sunglasses year round, whenever you are outdoors.  Notify your caregiver of new moles or changes in moles, especially if there is a change in shape or color. Also notify your caregiver if a mole is larger than the size of a pencil eraser.  A one-time screening for abdominal aortic aneurysm (AAA) and surgical repair of large AAAs by sound wave imaging (ultrasonography) is recommended for ages 65 to 75 years who are current or former smokers.  Stay current with your immunizations. Document Released: 02/24/2008 Document Revised: 11/20/2011 Document Reviewed: 01/23/2011 [employer] Patient Information 2013 [employer].  

## 2013-05-19 ENCOUNTER — Encounter: Payer: Self-pay | Admitting: Internal Medicine

## 2013-05-19 ENCOUNTER — Ambulatory Visit (INDEPENDENT_AMBULATORY_CARE_PROVIDER_SITE_OTHER): Payer: Medicare Other | Admitting: Internal Medicine

## 2013-05-19 ENCOUNTER — Other Ambulatory Visit (INDEPENDENT_AMBULATORY_CARE_PROVIDER_SITE_OTHER): Payer: Medicare Other

## 2013-05-19 VITALS — BP 148/78 | HR 80 | Temp 102.7°F | Resp 16 | Wt 142.8 lb

## 2013-05-19 DIAGNOSIS — K5900 Constipation, unspecified: Secondary | ICD-10-CM

## 2013-05-19 DIAGNOSIS — R1013 Epigastric pain: Secondary | ICD-10-CM

## 2013-05-19 DIAGNOSIS — R52 Pain, unspecified: Secondary | ICD-10-CM | POA: Diagnosis not present

## 2013-05-19 DIAGNOSIS — R509 Fever, unspecified: Secondary | ICD-10-CM | POA: Diagnosis not present

## 2013-05-19 DIAGNOSIS — J189 Pneumonia, unspecified organism: Secondary | ICD-10-CM

## 2013-05-19 LAB — URINALYSIS, ROUTINE W REFLEX MICROSCOPIC
Bilirubin Urine: NEGATIVE
Nitrite: NEGATIVE
Total Protein, Urine: NEGATIVE
Urine Glucose: NEGATIVE
Urobilinogen, UA: 0.2 (ref 0.0–1.0)

## 2013-05-19 LAB — COMPREHENSIVE METABOLIC PANEL WITH GFR
ALT: 15 U/L (ref 0–53)
AST: 20 U/L (ref 0–37)
Albumin: 3.8 g/dL (ref 3.5–5.2)
Alkaline Phosphatase: 57 U/L (ref 39–117)
BUN: 21 mg/dL (ref 6–23)
CO2: 29 meq/L (ref 19–32)
Calcium: 8.8 mg/dL (ref 8.4–10.5)
Chloride: 99 meq/L (ref 96–112)
Creatinine, Ser: 1.2 mg/dL (ref 0.4–1.5)
GFR: 63.79 mL/min
Glucose, Bld: 103 mg/dL — ABNORMAL HIGH (ref 70–99)
Potassium: 4.3 meq/L (ref 3.5–5.1)
Sodium: 135 meq/L (ref 135–145)
Total Bilirubin: 0.7 mg/dL (ref 0.3–1.2)
Total Protein: 7.5 g/dL (ref 6.0–8.3)

## 2013-05-19 LAB — CBC WITH DIFFERENTIAL/PLATELET
Basophils Absolute: 0 K/uL (ref 0.0–0.1)
Basophils Relative: 0.1 % (ref 0.0–3.0)
Eosinophils Absolute: 0 K/uL (ref 0.0–0.7)
Eosinophils Relative: 0.1 % (ref 0.0–5.0)
HCT: 45.4 % (ref 39.0–52.0)
Hemoglobin: 15.5 g/dL (ref 13.0–17.0)
Lymphocytes Relative: 8.2 % — ABNORMAL LOW (ref 12.0–46.0)
Lymphs Abs: 0.9 K/uL (ref 0.7–4.0)
MCHC: 34.2 g/dL (ref 30.0–36.0)
MCV: 89.3 fl (ref 78.0–100.0)
Monocytes Absolute: 1.6 K/uL — ABNORMAL HIGH (ref 0.1–1.0)
Monocytes Relative: 14.3 % — ABNORMAL HIGH (ref 3.0–12.0)
Neutro Abs: 8.6 K/uL — ABNORMAL HIGH (ref 1.4–7.7)
Neutrophils Relative %: 77.3 % — ABNORMAL HIGH (ref 43.0–77.0)
Platelets: 199 K/uL (ref 150.0–400.0)
RBC: 5.08 Mil/uL (ref 4.22–5.81)
RDW: 13.4 % (ref 11.5–14.6)
WBC: 11.1 K/uL — ABNORMAL HIGH (ref 4.5–10.5)

## 2013-05-19 LAB — LIPASE: Lipase: 21 U/L (ref 11.0–59.0)

## 2013-05-19 LAB — AMYLASE: Amylase: 51 U/L (ref 27–131)

## 2013-05-19 NOTE — Assessment & Plan Note (Signed)
It sounds like this is a viral syndrome I do not see anything that requires antibiotics Will check an xray to see if there is a source of infection and will look at labs as well

## 2013-05-19 NOTE — Assessment & Plan Note (Signed)
I will check his labs to look for secondary causes and will check a plain film to see if there is an ileus, sbo, etc.

## 2013-05-19 NOTE — Progress Notes (Signed)
Subjective:    Patient ID: Don Johnson, male    DOB: [birth year], 72 y.o.   MRN: [id number]  Fever  This is a new problem. Episode onset: for 3 days. The problem occurs constantly. The problem has been gradually improving. The maximum temperature noted was 101 to 101.9 F. The temperature was taken using an oral thermometer. Associated symptoms include abdominal pain (he had a brief episode of epigastric abd pain yesterday that quickly ressolved). Pertinent negatives include no chest pain, congestion, coughing, diarrhea, ear pain, headaches, muscle aches, nausea, rash, sleepiness, sore throat, urinary pain, vomiting or wheezing. He has tried nothing for the symptoms.      Review of Systems  Constitutional: Positive for fever. Negative for chills, diaphoresis, activity change, appetite change, fatigue and unexpected weight change.  HENT: Negative.  Negative for ear pain, congestion, sore throat, facial swelling, trouble swallowing, voice change and sinus pressure.   Eyes: Negative.   Respiratory: Negative.  Negative for apnea, cough, choking, chest tightness, shortness of breath, wheezing and stridor.   Cardiovascular: Negative.  Negative for chest pain, palpitations and leg swelling.  Gastrointestinal: Positive for abdominal pain (he had a brief episode of epigastric abd pain yesterday that quickly ressolved). Negative for nausea, vomiting, diarrhea, constipation, blood in stool, abdominal distention, anal bleeding and rectal pain.  Endocrine: Negative.   Genitourinary: Negative.  Negative for dysuria.  Musculoskeletal: Negative.   Skin: Negative.  Negative for color change, pallor, rash and wound.  Allergic/Immunologic: Negative.   Neurological: Negative.  Negative for headaches.  Hematological: Negative.  Negative for adenopathy. Does not bruise/bleed easily.  Psychiatric/Behavioral: Negative.        Objective:   Physical Exam  Vitals reviewed. Constitutional: He is oriented to  person, place, and time. He appears well-developed and well-nourished.  Non-toxic appearance. He does not have a sickly appearance. He does not appear ill. No distress.  HENT:  Head: Normocephalic and atraumatic.  Mouth/Throat: Oropharynx is clear and moist. No oropharyngeal exudate.  Eyes: Conjunctivae are normal. Right eye exhibits no discharge. Left eye exhibits no discharge. Right conjunctiva is not injected. Right conjunctiva has no hemorrhage. Left conjunctiva is not injected. Left conjunctiva has no hemorrhage. No scleral icterus.  Neck: Normal range of motion. Neck supple. No JVD present. No tracheal deviation present. No thyromegaly present.  Cardiovascular: Normal rate, regular rhythm, normal heart sounds and intact distal pulses.  Exam reveals no gallop and no friction rub.   No murmur heard. Pulmonary/Chest: Effort normal and breath sounds normal. No stridor. No respiratory distress. He has no wheezes. He has no rales. He exhibits no tenderness.  Abdominal: Soft. Bowel sounds are normal. He exhibits no distension and no mass. There is no tenderness. There is no rebound and no guarding.  Musculoskeletal: Normal range of motion. He exhibits no edema and no tenderness.  Lymphadenopathy:    He has no cervical adenopathy.  Neurological: He is oriented to person, place, and time.  Skin: Skin is warm and dry. No rash noted. He is not diaphoretic. No erythema. No pallor.     Lab Results  Component Value Date   WBC 7.1 06/27/2012   HGB 15.3 06/27/2012   HCT 46.6 06/27/2012   PLT 248.0 06/27/2012   GLUCOSE 96 06/27/2012   CHOL 248* 06/27/2012   TRIG 64.0 06/27/2012   HDL 64.00 06/27/2012   LDLDIRECT 177.9 06/27/2012   LDLCALC 122* 04/12/2011   ALT 24 06/27/2012   AST 28 06/27/2012   NA 141 06/27/2012  K 4.7 06/27/2012   CL 102 06/27/2012   CREATININE 1.0 06/27/2012   BUN 24* 06/27/2012   CO2 32 06/27/2012   TSH 2.04 06/27/2012   PSA 0.44 06/27/2012       Assessment & Plan:

## 2013-05-19 NOTE — Patient Instructions (Signed)
Fever   Fever is a higher-than-normal body temperature. A normal temperature varies with:   Age.   How it is measured (mouth, underarm, rectal, or ear).   Time of day.  In an adult, an oral temperature around 98.6 Fahrenheit (F) or 37 Celsius (C) is considered normal. A rise in temperature of about 1.8 F or 1 C is generally considered a fever (100.4 F or 38 C). In an infant age 72 days or less, a rectal temperature of 100.4 F (38 C) generally is regarded as fever. Fever is not a disease but can be a symptom of illness.  CAUSES    Fever is most commonly caused by infection.   Some non-infectious problems can cause fever. For example:   Some arthritis problems.   Problems with the thyroid or adrenal glands.   Immune system problems.   Some kinds of cancer.   A reaction to certain medicines.   Occasionally, the source of a fever cannot be determined. This is sometimes called a "Fever of Unknown Origin" (FUO).   Some situations may lead to a temporary rise in body temperature that may go away on its own. Examples are:   Childbirth.   Surgery.   Some situations may cause a rise in body temperature but these are not considered "true fever". Examples are:   Intense exercise.   Dehydration.   Exposure to high outside or room temperatures.  SYMPTOMS    Feeling warm or hot.   Fatigue or feeling exhausted.   Aching all over.   Chills.   Shivering.   Sweats.  DIAGNOSIS   A fever can be suspected by your caregiver feeling that your skin is unusually warm. The fever is confirmed by taking a temperature with a thermometer. Temperatures can be taken different ways. Some methods are accurate and some are not:  With adults, adolescents, and children:    An oral temperature is used most commonly.   An ear thermometer will only be accurate if it is positioned as recommended by the manufacturer.   Under the arm temperatures are not accurate and not recommended.   Most electronic thermometers are fast  and accurate.  Infants and Toddlers:   Rectal temperatures are recommended and most accurate.   Ear temperatures are not accurate in this age group and are not recommended.   Skin thermometers are not accurate.  RISKS AND COMPLICATIONS    During a fever, the body uses more oxygen, so a person with a fever may develop rapid breathing or shortness of breath. This can be dangerous especially in people with heart or lung disease.   The sweats that occur following a fever can cause dehydration.   High fever can cause seizures in infants and children.   Older persons can develop confusion during a fever.  TREATMENT    Medications may be used to control temperature.   Do not give aspirin to children with fevers. There is an association with Reye's syndrome. Reye's syndrome is a rare but potentially deadly disease.   If an infection is present and medications have been prescribed, take them as directed. Finish the full course of medications until they are gone.   Sponging or bathing with room-temperature water may help reduce body temperature. Do not use ice water or alcohol sponge baths.   Do not over-bundle children in blankets or heavy clothes.   Drinking adequate fluids during an illness with fever is important to prevent dehydration.  HOME CARE INSTRUCTIONS      For adults, rest and adequate fluid intake are important. Dress according to how you feel, but do not over-bundle.   Drink enough water and/or fluids to keep your urine clear or pale yellow.   For infants over 3 months and children, giving medication as directed by your caregiver to control fever can help with comfort. The amount to be given is based on the child's weight. Do NOT give more than is recommended.  SEEK MEDICAL CARE IF:    You or your child are unable to keep fluids down.   Vomiting or diarrhea develops.   You develop a skin rash.   An oral temperature above 102 F (38.9 C) develops, or a fever which persists for over 3  days.   You develop excessive weakness, dizziness, fainting or extreme thirst.   Fevers keep coming back after 3 days.  SEEK IMMEDIATE MEDICAL CARE IF:    Shortness of breath or trouble breathing develops   You pass out.   You feel you are making little or no urine.   New pain develops that was not there before (such as in the head, neck, chest, back, or abdomen).   You cannot hold down fluids.   Vomiting and diarrhea persist for more than a day or two.   You develop a stiff neck and/or your eyes become sensitive to light.   An unexplained temperature above 102 F (38.9 C) develops.  Document Released: 08/28/2005 Document Revised: 11/20/2011 Document Reviewed: 08/13/2008  [employer] Patient Information 2014 [employer].

## 2013-05-19 NOTE — Assessment & Plan Note (Signed)
He tells me that this has resolved Will check an xray and some labs to see if there is a secondary cause for this

## 2013-05-20 ENCOUNTER — Encounter: Payer: Self-pay | Admitting: Internal Medicine

## 2013-05-20 ENCOUNTER — Ambulatory Visit (INDEPENDENT_AMBULATORY_CARE_PROVIDER_SITE_OTHER)
Admission: RE | Admit: 2013-05-20 | Discharge: 2013-05-20 | Disposition: A | Discharge status: Home / Self Care | Payer: Medicare Other | Source: Ambulatory Visit | Attending: Internal Medicine | Admitting: Internal Medicine

## 2013-05-20 ENCOUNTER — Other Ambulatory Visit: Payer: Self-pay | Admitting: Internal Medicine

## 2013-05-20 ENCOUNTER — Telehealth: Payer: Self-pay | Admitting: Internal Medicine

## 2013-05-20 DIAGNOSIS — R1013 Epigastric pain: Secondary | ICD-10-CM | POA: Diagnosis not present

## 2013-05-20 DIAGNOSIS — R509 Fever, unspecified: Secondary | ICD-10-CM | POA: Diagnosis not present

## 2013-05-20 DIAGNOSIS — J189 Pneumonia, unspecified organism: Secondary | ICD-10-CM

## 2013-05-20 DIAGNOSIS — R52 Pain, unspecified: Secondary | ICD-10-CM

## 2013-05-20 DIAGNOSIS — K5900 Constipation, unspecified: Secondary | ICD-10-CM | POA: Diagnosis not present

## 2013-05-20 MED ORDER — MOXIFLOXACIN HCL 400 MG PO TABS: 400.0000 mg | ORAL_TABLET | Freq: Every day | ORAL | Status: DC

## 2013-05-20 NOTE — Addendum Note (Signed)
Addended by: Etta Grandchild on: 05/20/2013 07:09 PM   Modules accepted: Orders

## 2013-05-20 NOTE — Telephone Encounter (Signed)
Patient Information:  Caller Name: Alfonse Ras  Phone: [phone]  Patient: Don Johnson, Don Johnson  Gender: Male  DOB: 25-Nov-1940  Age: 72 Years  PCP: Sanda Linger (Adults only)  Office Follow Up:  Does the office need to follow up with this patient?: No  Instructions For The Office: N/A  RN Note:  Abdominal cramping, no appetite, fever x 72 hours.  Seen in office 05/19/13 and bloodwork done which shows only mild elevation of WBC.  States condition is not improved from when seen, but not worse.  States has multiple shaking chills, but nothing lasting > 30 minutes.  Occasional mild cough with headache.  States he is mildly confused, which is a new change for him.  Denies urinary symptoms, but per urinary pain protocol disposition See in Office Now. States urine specimen was obtained.  Per Epic, order for abdominal xray with chest was ordered, but patient states was never done.  TC to office; patient may come to the [facility] building to have xrays done.  Advised to follow up with office before going back home.  Patient agrees; office notified.  krs/can  Symptoms  Reason For Call & Symptoms: fever  Reviewed Health History In EMR: Yes  Reviewed Medications In EMR: Yes  Reviewed Allergies In EMR: Yes  Reviewed Surgeries / Procedures: Yes  Date of Onset of Symptoms: 05/17/2013  Any Fever: Yes  Fever Taken: Oral  Fever Time Of Reading: 11:30:00  Fever Last Reading: 102.8  Guideline(s) Used:  Urination Pain - Male  Disposition Per Guideline:   Go to Office Now  Reason For Disposition Reached:   Fever > 100.5 F (38.1 C)  Advice Given:  N/A  Patient Will Follow Care Advice:  YES

## 2013-05-20 NOTE — Telephone Encounter (Signed)
Patient stopped by to have xray done from 05/19/13. He is requesting results and advisement if any medication is needed, if so, would like this sent to [facility], Thanks

## 2013-05-20 NOTE — Telephone Encounter (Signed)
CXR shows Pneumonia, avelox started

## 2013-05-21 NOTE — Telephone Encounter (Signed)
Patient notified per MD.

## 2013-06-04 ENCOUNTER — Encounter: Payer: Self-pay | Admitting: Internal Medicine

## 2013-06-04 ENCOUNTER — Ambulatory Visit (INDEPENDENT_AMBULATORY_CARE_PROVIDER_SITE_OTHER)
Admission: RE | Admit: 2013-06-04 | Discharge: 2013-06-04 | Disposition: A | Discharge status: Home / Self Care | Payer: Medicare Other | Source: Ambulatory Visit | Attending: Internal Medicine | Admitting: Internal Medicine

## 2013-06-04 ENCOUNTER — Ambulatory Visit (INDEPENDENT_AMBULATORY_CARE_PROVIDER_SITE_OTHER): Payer: Medicare Other | Admitting: Internal Medicine

## 2013-06-04 VITALS — BP 114/62 | HR 59 | Temp 98.1°F | Resp 16 | Wt 141.2 lb

## 2013-06-04 DIAGNOSIS — Z23 Encounter for immunization: Secondary | ICD-10-CM

## 2013-06-04 DIAGNOSIS — J189 Pneumonia, unspecified organism: Secondary | ICD-10-CM

## 2013-06-04 NOTE — Patient Instructions (Signed)

## 2013-06-04 NOTE — Assessment & Plan Note (Signed)
It appears that the PNA has resolved I will recheck his CXR today to see if there is resolution

## 2013-06-04 NOTE — Progress Notes (Signed)
  Subjective:    Patient ID: Don Johnson, male    DOB: [birth year], 72 y.o.   MRN: [id number]  Cough The current episode started 1 to 4 weeks ago. The problem has been resolved. Pertinent negatives include no chest pain, chills, ear congestion, ear pain, fever, headaches, heartburn, hemoptysis, myalgias, nasal congestion, postnasal drip, rash, rhinorrhea, sore throat, shortness of breath, sweats, weight loss or wheezing. Treatments tried: avelox. His past medical history is significant for pneumonia.      Review of Systems  Constitutional: Negative.  Negative for fever, chills, weight loss, diaphoresis, appetite change and fatigue.  HENT: Negative for ear pain, sore throat, facial swelling, rhinorrhea and postnasal drip.   Eyes: Negative.   Respiratory: Positive for cough. Negative for apnea, hemoptysis, choking, chest tightness, shortness of breath, wheezing and stridor.   Cardiovascular: Negative.  Negative for chest pain, palpitations and leg swelling.  Gastrointestinal: Negative.  Negative for heartburn, nausea, vomiting, abdominal pain, diarrhea and constipation.  Endocrine: Negative.   Genitourinary: Negative.   Musculoskeletal: Negative.  Negative for myalgias.  Skin: Negative.  Negative for rash.  Allergic/Immunologic: Negative.   Neurological: Negative.  Negative for dizziness and headaches.  Hematological: Negative.  Negative for adenopathy. Does not bruise/bleed easily.  Psychiatric/Behavioral: Negative.        Objective:   Physical Exam  Vitals reviewed. Constitutional: He is oriented to person, place, and time. He appears well-developed and well-nourished.  Non-toxic appearance. He does not have a sickly appearance. He does not appear ill. No distress.  HENT:  Head: Normocephalic and atraumatic.  Mouth/Throat: Oropharynx is clear and moist. No oropharyngeal exudate.  Eyes: Conjunctivae are normal. Right eye exhibits no discharge. Left eye exhibits no discharge. No  scleral icterus.  Neck: Normal range of motion. Neck supple. No JVD present. No tracheal deviation present. No thyromegaly present.  Cardiovascular: Normal rate, regular rhythm, normal heart sounds and intact distal pulses.  Exam reveals no gallop and no friction rub.   No murmur heard. Pulmonary/Chest: Effort normal and breath sounds normal. No stridor. No respiratory distress. He has no wheezes. He has no rales. He exhibits no tenderness.  Abdominal: Soft. Bowel sounds are normal. He exhibits no distension and no mass. There is no tenderness. There is no rebound and no guarding.  Musculoskeletal: Normal range of motion. He exhibits no edema and no tenderness.  Lymphadenopathy:    He has no cervical adenopathy.  Neurological: He is oriented to person, place, and time.  Skin: Skin is warm and dry. No rash noted. He is not diaphoretic. No erythema. No pallor.  Psychiatric: He has a normal mood and affect. His behavior is normal. Judgment and thought content normal.     Lab Results  Component Value Date   WBC 11.1* 05/19/2013   HGB 15.5 05/19/2013   HCT 45.4 05/19/2013   PLT 199.0 05/19/2013   GLUCOSE 103* 05/19/2013   CHOL 248* 06/27/2012   TRIG 64.0 06/27/2012   HDL 64.00 06/27/2012   LDLDIRECT 177.9 06/27/2012   LDLCALC 122* 04/12/2011   ALT 15 05/19/2013   AST 20 05/19/2013   NA 135 05/19/2013   K 4.3 05/19/2013   CL 99 05/19/2013   CREATININE 1.2 05/19/2013   BUN 21 05/19/2013   CO2 29 05/19/2013   TSH 1.49 05/19/2013   PSA 0.44 06/27/2012       Assessment & Plan:

## 2013-06-04 NOTE — Addendum Note (Signed)
Addended by: Rock Nephew T on: 06/04/2013 12:08 PM   Modules accepted: Orders

## 2013-06-16 ENCOUNTER — Encounter: Payer: Self-pay | Admitting: Internal Medicine

## 2013-06-16 ENCOUNTER — Ambulatory Visit (INDEPENDENT_AMBULATORY_CARE_PROVIDER_SITE_OTHER): Payer: Medicare Other | Admitting: Internal Medicine

## 2013-06-16 VITALS — BP 150/80 | HR 64 | Temp 97.0°F | Resp 16 | Wt 145.0 lb

## 2013-06-16 DIAGNOSIS — J069 Acute upper respiratory infection, unspecified: Secondary | ICD-10-CM

## 2013-06-16 MED ORDER — PROMETHAZINE-CODEINE 6.25-10 MG/5ML PO SYRP: 5.0000 mL | ORAL_SOLUTION | ORAL | Status: DC | PRN

## 2013-06-16 MED ORDER — AZITHROMYCIN 250 MG PO TABS: ORAL_TABLET | ORAL | Status: DC

## 2013-06-16 NOTE — Assessment & Plan Note (Signed)
Zpac if worse Prom-cod syr prn

## 2013-06-16 NOTE — Progress Notes (Signed)
Subjective:     Patient ID: Don Johnson, male   DOB: Mar 16, 1941, 72 y.o.   MRN: [id number]  Cough This is a new problem. The current episode started in the past 7 days. The problem has been gradually worsening. The cough is productive of sputum. Associated symptoms include rhinorrhea. Pertinent negatives include no chest pain, fever, headaches, rash or sore throat. He has tried OTC cough suppressant for the symptoms. The treatment provided no relief. There is no history of asthma or COPD.     Review of Systems  Constitutional: Negative for fever, appetite change, fatigue and unexpected weight change.  HENT: Positive for rhinorrhea. Negative for nosebleeds, congestion, sore throat, sneezing, trouble swallowing and neck pain.   Eyes: Negative for itching and visual disturbance.  Respiratory: Positive for cough.   Cardiovascular: Negative for chest pain, palpitations and leg swelling.  Gastrointestinal: Negative for nausea, diarrhea, blood in stool and abdominal distention.  Genitourinary: Negative for frequency and hematuria.  Musculoskeletal: Negative for back pain, joint swelling and gait problem.  Skin: Negative for rash.  Neurological: Negative for dizziness, tremors, speech difficulty, weakness and headaches.  Psychiatric/Behavioral: Negative for sleep disturbance, dysphoric mood and agitation. The patient is not nervous/anxious.        Objective:   Physical Exam  Constitutional: He is oriented to person, place, and time. He appears well-developed. No distress.  NAD  HENT:  Mouth/Throat: Oropharynx is clear and moist.  eryth throat  Eyes: Conjunctivae are normal. Pupils are equal, round, and reactive to light.  Neck: Normal range of motion. No JVD present. No thyromegaly present.  Cardiovascular: Normal rate, regular rhythm, normal heart sounds and intact distal pulses.  Exam reveals no gallop and no friction rub.   No murmur heard. Pulmonary/Chest: Effort normal and breath  sounds normal. No respiratory distress. He has no wheezes. He has no rales. He exhibits no tenderness.  Abdominal: Soft. Bowel sounds are normal. He exhibits no distension and no mass. There is no tenderness. There is no rebound and no guarding.  Musculoskeletal: Normal range of motion. He exhibits no edema and no tenderness.  Lymphadenopathy:    He has no cervical adenopathy.  Neurological: He is alert and oriented to person, place, and time. He has normal reflexes. No cranial nerve deficit. He exhibits normal muscle tone. He displays a negative Romberg sign. Coordination and gait normal.  No meningeal signs  Skin: Skin is warm and dry. No rash noted.  Psychiatric: He has a normal mood and affect. His behavior is normal. Judgment and thought content normal.       Assessment:         Plan:

## 2013-06-16 NOTE — Patient Instructions (Addendum)
Use over-the-counter  "cold" medicines  such as  "Afrin" nasal spray for nasal congestion as directed instead. Use" Delsym" or" Robitussin" cough syrup varietis for cough.  You can use plain "Tylenol" or "Advi"l for fever, chills and achyness.   "Common cold" symptoms are usually triggered by a virus.  The antibiotics are usually not necessary. On average, a" viral cold" illness would take 4-7 days to resolve. Please, make an appointment if you are not better or if you're worse.  

## 2013-06-30 ENCOUNTER — Encounter: Payer: Self-pay | Admitting: Internal Medicine

## 2013-06-30 ENCOUNTER — Ambulatory Visit (INDEPENDENT_AMBULATORY_CARE_PROVIDER_SITE_OTHER): Payer: Medicare Other | Admitting: Internal Medicine

## 2013-06-30 ENCOUNTER — Other Ambulatory Visit (INDEPENDENT_AMBULATORY_CARE_PROVIDER_SITE_OTHER): Payer: Medicare Other

## 2013-06-30 ENCOUNTER — Encounter: Payer: Self-pay | Admitting: Gastroenterology

## 2013-06-30 VITALS — BP 140/78 | HR 56 | Temp 98.0°F | Resp 16 | Ht 67.0 in | Wt 143.0 lb

## 2013-06-30 DIAGNOSIS — N401 Enlarged prostate with lower urinary tract symptoms: Secondary | ICD-10-CM

## 2013-06-30 DIAGNOSIS — R7309 Other abnormal glucose: Secondary | ICD-10-CM

## 2013-06-30 DIAGNOSIS — E785 Hyperlipidemia, unspecified: Secondary | ICD-10-CM

## 2013-06-30 DIAGNOSIS — K222 Esophageal obstruction: Secondary | ICD-10-CM

## 2013-06-30 DIAGNOSIS — Z0000 Encounter for general adult medical examination without abnormal findings: Secondary | ICD-10-CM

## 2013-06-30 DIAGNOSIS — R7303 Prediabetes: Secondary | ICD-10-CM | POA: Insufficient documentation

## 2013-06-30 DIAGNOSIS — N529 Male erectile dysfunction, unspecified: Secondary | ICD-10-CM

## 2013-06-30 DIAGNOSIS — Z23 Encounter for immunization: Secondary | ICD-10-CM | POA: Diagnosis not present

## 2013-06-30 LAB — URINALYSIS, ROUTINE W REFLEX MICROSCOPIC
Bilirubin Urine: NEGATIVE
Nitrite: NEGATIVE
Total Protein, Urine: NEGATIVE
Urobilinogen, UA: 0.2 (ref 0.0–1.0)
pH: 5.5 (ref 5.0–8.0)

## 2013-06-30 LAB — LIPID PANEL
Cholesterol: 230 mg/dL — ABNORMAL HIGH (ref 0–200)
HDL: 61 mg/dL (ref >39.00)
Triglycerides: 115 mg/dL (ref 0.0–149.0)
VLDL: 23 mg/dL (ref 0.0–40.0)

## 2013-06-30 LAB — HEMOGLOBIN A1C: Hgb A1c MFr Bld: 6.3 % (ref 4.6–6.5)

## 2013-06-30 LAB — COMPREHENSIVE METABOLIC PANEL
Alkaline Phosphatase: 61 U/L (ref 39–117)
BUN: 16 mg/dL (ref 6–23)
CO2: 32 mEq/L (ref 19–32)
Creatinine, Ser: 1 mg/dL (ref 0.4–1.5)
Glucose, Bld: 96 mg/dL (ref 70–99)
Potassium: 4.5 mEq/L (ref 3.5–5.1)
Total Bilirubin: 1 mg/dL (ref 0.3–1.2)
Total Protein: 6.9 g/dL (ref 6.0–8.3)

## 2013-06-30 LAB — TSH: TSH: 2.43 u[IU]/mL (ref 0.35–5.50)

## 2013-06-30 LAB — FECAL OCCULT BLOOD, GUAIAC: Fecal Occult Blood: NEGATIVE

## 2013-06-30 LAB — CBC WITH DIFFERENTIAL/PLATELET
Basophils Relative: 0.8 % (ref 0.0–3.0)
Eosinophils Relative: 5.2 % — ABNORMAL HIGH (ref 0.0–5.0)
HCT: 42.4 % (ref 39.0–52.0)
Hemoglobin: 14.2 g/dL (ref 13.0–17.0)
Lymphocytes Relative: 31.7 % (ref 12.0–46.0)
Lymphs Abs: 2 10*3/uL (ref 0.7–4.0)
MCV: 89.8 fl (ref 78.0–100.0)
Monocytes Absolute: 0.8 10*3/uL (ref 0.1–1.0)
Monocytes Relative: 12.4 % — ABNORMAL HIGH (ref 3.0–12.0)
Neutro Abs: 3.2 10*3/uL (ref 1.4–7.7)
Neutrophils Relative %: 49.9 % (ref 43.0–77.0)
Platelets: 284 10*3/uL (ref 150.0–400.0)
RBC: 4.72 Mil/uL (ref 4.22–5.81)
WBC: 6.4 10*3/uL (ref 4.5–10.5)

## 2013-06-30 LAB — LDL CHOLESTEROL, DIRECT: Direct LDL: 149.1 mg/dL

## 2013-06-30 MED ORDER — ROSUVASTATIN CALCIUM 10 MG PO TABS: 10.0000 mg | ORAL_TABLET | Freq: Every day | ORAL | Status: DC

## 2013-06-30 MED ORDER — VARDENAFIL HCL 20 MG PO TABS: 20.0000 mg | ORAL_TABLET | Freq: Every day | ORAL | Status: DC | PRN

## 2013-06-30 NOTE — Assessment & Plan Note (Signed)
I have asked him to f/up with GI about this

## 2013-06-30 NOTE — Progress Notes (Signed)
Subjective:    Patient ID: Don Johnson, male    DOB: September 16, 1940, 72 y.o.   MRN: [id number]  Hyperlipidemia This is a chronic problem. The current episode started more than 1 year ago. The problem is uncontrolled. Recent lipid tests were reviewed and are variable. He has no history of chronic renal disease, diabetes, hypothyroidism, liver disease, obesity or nephrotic syndrome. There are no known factors aggravating his hyperlipidemia. Pertinent negatives include no chest pain, focal sensory loss, focal weakness, leg pain, myalgias or shortness of breath. Current antihyperlipidemic treatment includes diet change. The current treatment provides mild improvement of lipids.      Review of Systems  Constitutional: Negative.  Negative for fever, chills, diaphoresis, activity change, appetite change, fatigue and unexpected weight change.  HENT: Positive for trouble swallowing (he feels like food is getting stuck in his upper esophagus again). Negative for voice change.   Eyes: Negative.   Respiratory: Negative.  Negative for cough, chest tightness, shortness of breath, wheezing and stridor.   Cardiovascular: Negative.  Negative for chest pain, palpitations and leg swelling.  Gastrointestinal: Negative.  Negative for nausea, vomiting, abdominal pain, diarrhea, constipation and blood in stool.  Endocrine: Negative.   Genitourinary: Negative.        He complains of ED for many years  Musculoskeletal: Negative.  Negative for arthralgias, back pain, gait problem, joint swelling, myalgias, neck pain and neck stiffness.  Skin: Negative.   Allergic/Immunologic: Negative.   Neurological: Negative.  Negative for dizziness, tremors, focal weakness, speech difficulty, weakness, light-headedness and headaches.  Hematological: Negative.  Negative for adenopathy. Does not bruise/bleed easily.  Psychiatric/Behavioral: Negative.        Objective:   Physical Exam  Vitals reviewed. Constitutional: He is  oriented to person, place, and time. He appears well-developed and well-nourished. No distress.  HENT:  Head: Normocephalic and atraumatic.  Mouth/Throat: Oropharynx is clear and moist. No oropharyngeal exudate.  Eyes: Conjunctivae are normal. Right eye exhibits no discharge. Left eye exhibits no discharge. No scleral icterus.  Neck: Normal range of motion. Neck supple. No JVD present. No tracheal deviation present. No thyromegaly present.  Cardiovascular: Normal rate, regular rhythm, normal heart sounds and intact distal pulses.  Exam reveals no gallop and no friction rub.   No murmur heard. Pulmonary/Chest: Effort normal and breath sounds normal. No stridor. No respiratory distress. He has no wheezes. He has no rales. He exhibits no tenderness.  Abdominal: Soft. Bowel sounds are normal. He exhibits no distension and no mass. There is no tenderness. There is no rebound and no guarding. Hernia confirmed negative in the right inguinal area and confirmed negative in the left inguinal area.  Genitourinary: Prostate normal, testes normal and penis normal. Rectal exam shows external hemorrhoid. Rectal exam shows no internal hemorrhoid, no fissure, no mass, no tenderness and anal tone normal. Guaiac negative stool. Prostate is not enlarged and not tender. Right testis shows no mass, no swelling and no tenderness. Right testis is descended. Left testis shows no mass, no swelling and no tenderness. Left testis is descended. Uncircumcised. No phimosis, paraphimosis, hypospadias, penile erythema or penile tenderness. No discharge found.  Musculoskeletal: Normal range of motion. He exhibits no edema and no tenderness.  Lymphadenopathy:    He has no cervical adenopathy.       Right: No inguinal adenopathy present.       Left: No inguinal adenopathy present.  Neurological: He is oriented to person, place, and time.  Skin: Skin is warm and dry.  No rash noted. He is not diaphoretic. No erythema. No pallor.   Psychiatric: He has a normal mood and affect. His behavior is normal. Judgment and thought content normal.     Lab Results  Component Value Date   WBC 11.1* 05/19/2013   HGB 15.5 05/19/2013   HCT 45.4 05/19/2013   PLT 199.0 05/19/2013   GLUCOSE 103* 05/19/2013   CHOL 248* 06/27/2012   TRIG 64.0 06/27/2012   HDL 64.00 06/27/2012   LDLDIRECT 177.9 06/27/2012   LDLCALC 122* 04/12/2011   ALT 15 05/19/2013   AST 20 05/19/2013   NA 135 05/19/2013   K 4.3 05/19/2013   CL 99 05/19/2013   CREATININE 1.2 05/19/2013   BUN 21 05/19/2013   CO2 29 05/19/2013   TSH 1.49 05/19/2013   PSA 0.44 06/27/2012       Assessment & Plan:

## 2013-06-30 NOTE — Assessment & Plan Note (Signed)
He agrees to start crestor I will check his FLP CMP TSH today

## 2013-06-30 NOTE — Assessment & Plan Note (Addendum)

## 2013-06-30 NOTE — Patient Instructions (Signed)
Hypercholesterolemia High Blood Cholesterol Cholesterol is a white, waxy, fat-like protein needed by your body in small amounts. The liver makes all the cholesterol you need. It is carried from the liver by the blood through the blood vessels. Deposits (plaque) may build up on blood vessel walls. This makes the arteries narrower and stiffer. Plaque increases the risk for heart attack and stroke. You cannot feel your cholesterol level even if it is very high. The only way to know is by a blood test to check your lipid (fats) levels. Once you know your cholesterol levels, you should keep a record of the test results. Work with your caregiver to to keep your levels in the desired range. WHAT THE RESULTS MEAN:  Total cholesterol is a rough measure of all the cholesterol in your blood.  LDL is the so-called bad cholesterol. This is the type that deposits cholesterol in the walls of the arteries. You want this level to be low.  HDL is the good cholesterol because it cleans the arteries and carries the LDL away. You want this level to be high.  Triglycerides are fat that the body can either burn for energy or store. High levels are closely linked to heart disease. DESIRED LEVELS:  Total cholesterol below 200.  LDL below 100 for people at risk, below 70 for very high risk.  HDL above 50 is good, above 60 is best.  Triglycerides below 150. HOW TO LOWER YOUR CHOLESTEROL:  Diet.  Choose fish or white meat chicken and [place], roasted or baked. Limit fatty cuts of red meat, fried foods, and processed meats, such as sausage and lunch meat.  Eat lots of fresh fruits and vegetables. Choose whole grains, beans, pasta, potatoes and cereals.  Use only small amounts of olive, corn or canola oils. Avoid butter, mayonnaise, shortening or palm kernel oils. Avoid foods with trans-fats.  Use skim/nonfat milk and low-fat/nonfat yogurt and cheeses. Avoid whole milk, cream, ice cream, egg yolks and cheeses.  Healthy desserts include angel food cake, gingersnaps, animal crackers, hard candy, popsicles, and low-fat/nonfat frozen yogurt. Avoid pastries, cakes, pies and cookies.  Exercise.  A regular program helps decrease LDL and raises HDL.  Helps with weight control.  Do things that increase your activity level like gardening, walking, or taking the stairs.  Medication.  May be prescribed by your caregiver to help lowering cholesterol and the risk for heart disease.  You may need medicine even if your levels are normal if you have several risk factors. HOME CARE INSTRUCTIONS   Follow your diet and exercise programs as suggested by your caregiver.  Take medications as directed.  Have blood work done when your caregiver feels it is necessary. MAKE SURE YOU:   Understand these instructions.  Will watch your condition.  Will get help right away if you are not doing well or get worse. Document Released: 08/28/2005 Document Revised: 11/20/2011 Document Reviewed: 02/13/2007 [facility] Patient Information 2014 [place], [place]. Health Maintenance, Males A healthy lifestyle and preventative care can promote health and wellness.  Maintain regular health, dental, and eye exams.  Eat a healthy diet. Foods like vegetables, fruits, whole grains, low-fat dairy products, and lean protein foods contain the nutrients you need without too many calories. Decrease your intake of foods high in solid fats, added sugars, and salt. Get information about a proper diet from your caregiver, if necessary.  Regular physical exercise is one of the most important things you can do for your health. Most adults should get  at least 150 minutes of moderate-intensity exercise (any activity that increases your heart rate and causes you to sweat) each week. In addition, most adults need muscle-strengthening exercises on 2 or more days a week.   Maintain a healthy weight. The body mass index (BMI) is a screening tool to  identify possible weight problems. It provides an estimate of body fat based on height and weight. Your caregiver can help determine your BMI, and can help you achieve or maintain a healthy weight. For adults 20 years and older:  A BMI below 18.5 is considered underweight.  A BMI of 18.5 to 24.9 is normal.  A BMI of 25 to 29.9 is considered overweight.  A BMI of 30 and above is considered obese.  Maintain normal blood lipids and cholesterol by exercising and minimizing your intake of saturated fat. Eat a balanced diet with plenty of fruits and vegetables. Blood tests for lipids and cholesterol should begin at age 57 and be repeated every 5 years. If your lipid or cholesterol levels are high, you are over 50, or you are a high risk for heart disease, you may need your cholesterol levels checked more frequently.Ongoing high lipid and cholesterol levels should be treated with medicines, if diet and exercise are not effective.  If you smoke, find out from your caregiver how to quit. If you do not use tobacco, do not start.  If you choose to drink alcohol, do not exceed 2 drinks per day. One drink is considered to be 12 ounces (355 mL) of beer, 5 ounces (148 mL) of wine, or 1.5 ounces (44 mL) of liquor.  Avoid use of street drugs. Do not share needles with anyone. Ask for help if you need support or instructions about stopping the use of drugs.  High blood pressure causes heart disease and increases the risk of stroke. Blood pressure should be checked at least every 1 to 2 years. Ongoing high blood pressure should be treated with medicines if weight loss and exercise are not effective.  If you are 56 to 72 years old, ask your caregiver if you should take aspirin to prevent heart disease.  Diabetes screening involves taking a blood sample to check your fasting blood sugar level. This should be done once every 3 years, after age 58, if you are within normal weight and without risk factors for  diabetes. Testing should be considered at a younger age or be carried out more frequently if you are overweight and have at least 1 risk factor for diabetes.  Colorectal cancer can be detected and often prevented. Most routine colorectal cancer screening begins at the age of 81 and continues through age 67. However, your caregiver may recommend screening at an earlier age if you have risk factors for colon cancer. On a yearly basis, your caregiver may provide home test kits to check for hidden blood in the stool. Use of a small camera at the end of a tube, to directly examine the colon (sigmoidoscopy or colonoscopy), can detect the earliest forms of colorectal cancer. Talk to your caregiver about this at age 71, when routine screening begins. Direct examination of the colon should be repeated every 5 to 10 years through age 38, unless early forms of pre-cancerous polyps or small growths are found.  Hepatitis C blood testing is recommended for all people born from [birth year] through 1965 and any individual with known risks for hepatitis C.  Healthy men should no longer receive prostate-specific antigen (PSA) blood tests as  part of routine cancer screening. Consult with your caregiver about prostate cancer screening.  Testicular cancer screening is not recommended for adolescents or adult males who have no symptoms. Screening includes self-exam, caregiver exam, and other screening tests. Consult with your caregiver about any symptoms you have or any concerns you have about testicular cancer.  Practice safe sex. Use condoms and avoid high-risk sexual practices to reduce the spread of sexually transmitted infections (STIs).  Use sunscreen with a sun protection factor (SPF) of 30 or greater. Apply sunscreen liberally and repeatedly throughout the day. You should seek shade when your shadow is shorter than you. Protect yourself by wearing long sleeves, pants, a wide-brimmed hat, and sunglasses year round, whenever  you are outdoors.  Notify your caregiver of new moles or changes in moles, especially if there is a change in shape or color. Also notify your caregiver if a mole is larger than the size of a pencil eraser.  A one-time screening for abdominal aortic aneurysm (AAA) and surgical repair of large AAAs by sound wave imaging (ultrasonography) is recommended for ages 29 to 46 years who are current or former smokers.  Stay current with your immunizations. Document Released: 02/24/2008 Document Revised: 11/20/2011 Document Reviewed: 01/23/2011 [facility] Patient Information 2014 [place], [place].

## 2013-06-30 NOTE — Assessment & Plan Note (Signed)
I will check his A1C to see if he has developed DM2 

## 2013-06-30 NOTE — Assessment & Plan Note (Signed)
He will try levitra

## 2013-07-15 ENCOUNTER — Encounter: Payer: Self-pay | Admitting: Gastroenterology

## 2013-07-15 ENCOUNTER — Ambulatory Visit (INDEPENDENT_AMBULATORY_CARE_PROVIDER_SITE_OTHER): Payer: Medicare Other | Admitting: Gastroenterology

## 2013-07-15 VITALS — BP 132/72 | HR 64 | Ht 65.5 in | Wt 142.2 lb

## 2013-07-15 DIAGNOSIS — K219 Gastro-esophageal reflux disease without esophagitis: Secondary | ICD-10-CM | POA: Diagnosis not present

## 2013-07-15 DIAGNOSIS — R1310 Dysphagia, unspecified: Secondary | ICD-10-CM | POA: Diagnosis not present

## 2013-07-15 NOTE — Progress Notes (Signed)
This is a 72 year old Caucasian male who has a known hiatal hernia and chronic GERD. He had esophageal dilatation in May of 2010. He currently uses when necessary Pepcid and denies significant acid reflux symptoms, but for the last several months has had intermittent solid food dysphagia. He denies any cardiopulmonary symptoms, lower GI or hepatobiliary problems. He has not had a good emergency room with a meat impaction. Patient does not smoke, abuse ethanol or abuse NSAIDs. Despite these complaints his weight has been stable. He otherwise is generally healthy except for some hypercholesterolemia, and has no symptoms of Raynaud's phenomenon or collagen vascular disease.  Current Medications, Allergies, Past Medical History, Past Surgical History, Family History and Social History were reviewed in [employer] record.  ROS: All systems were reviewed and are negative unless otherwise stated in the HPI.          Physical Exam: Awake and alert healthy patient in no distress. Blood pressure 132/72, pulse 64 and regular and weight 142 with a BMI of 23.3. Examination oropharynx is unremarkable. Examination the neck shows no lymphadenopathy, thyromegaly, masses or tenderness. Chest is clear he appears to be in a regular rhythm without murmurs gallops or rubs. Abdominal exam shows no organomegaly, masses or tenderness. Bowel sounds are normal. Mental status is normal and peripheral extremities are unremarkable.    Assessment and Plan: Hiatal hernia with chronic GERD and recurrent peptic stricture the esophagus versus Schatzki's ring. I've scheduled him for repeat endoscopy and esophageal dilatation at his convenience. Risk and benefits of this procedure have been explained in detail, he is agreed to proceed as planned. He is a good candidate for conscious-deep sedation. Is not on any anticoagulants and has no cardiopulmonary problems.

## 2013-07-15 NOTE — Patient Instructions (Signed)

## 2013-07-16 ENCOUNTER — Ambulatory Visit (AMBULATORY_SURGERY_CENTER): Payer: Medicare Other | Admitting: Gastroenterology

## 2013-07-16 ENCOUNTER — Encounter: Payer: Self-pay | Admitting: Gastroenterology

## 2013-07-16 VITALS — BP 117/64 | HR 53 | Temp 97.1°F | Resp 17 | Ht 65.0 in | Wt 142.0 lb

## 2013-07-16 DIAGNOSIS — K219 Gastro-esophageal reflux disease without esophagitis: Secondary | ICD-10-CM | POA: Diagnosis not present

## 2013-07-16 DIAGNOSIS — K222 Esophageal obstruction: Secondary | ICD-10-CM | POA: Diagnosis not present

## 2013-07-16 DIAGNOSIS — K2970 Gastritis, unspecified, without bleeding: Secondary | ICD-10-CM | POA: Diagnosis not present

## 2013-07-16 DIAGNOSIS — R1310 Dysphagia, unspecified: Secondary | ICD-10-CM

## 2013-07-16 DIAGNOSIS — M1990 Unspecified osteoarthritis, unspecified site: Secondary | ICD-10-CM | POA: Diagnosis not present

## 2013-07-16 MED ORDER — SODIUM CHLORIDE 0.9 % IV SOLN: 500.0000 mL | INTRAVENOUS | Status: DC

## 2013-07-16 NOTE — Op Note (Signed)
[facility] [address] [place], [address]   ENDOSCOPY PROCEDURE REPORT  PATIENT: Don Johnson, Don Johnson  MR#: [id number] BIRTHDATE: May 01, 1941 , 72  yrs. old GENDER: Male ENDOSCOPIST:Belen Pesch Hale Bogus, MD, [facility] REFERRED BY: PROCEDURE DATE:  07/16/2013 PROCEDURE:   EGD w/ biopsy and Maloney dilation of esophagus ASA CLASS:    Class II INDICATIONS: Dysphagia. MEDICATION: propofol (Diprivan) 150mg  IV TOPICAL ANESTHETIC:  DESCRIPTION OF PROCEDURE:   After the risks and benefits of the procedure were explained, informed consent was obtained.  The LB [id number]  endoscope was introduced through the mouth  and advanced to the second portion of the duodenum .  The instrument was slowly withdrawn as the mucosa was fully examined.      DUODENUM: The duodenal mucosa showed no abnormalities.  STOMACH: A large hiatal hernia was noted.  ESOPHAGUS: There was evidence of suspected Barrett's esophagus at the gastroesophageal junction.  Multiple biopsies were performed using cold forceps. of the stricture.  Sample sent for histology. A stricture was found at the gastroesophageal junction.  The stenosis was traversable with the endoscope.Dialted with #52 F Maloney dilator...no heme or pain noted.Marland KitchenMarland KitchenMarland KitchenSee Pictures. Retroflexed views revealed a large hiatal hernia. and strictured GE junction.    The scope was then withdrawn from the patient and the procedure completed.  COMPLICATIONS: There were no complications.   ENDOSCOPIC IMPRESSION: 1.   The duodenal mucosa showed no abnormalities 2.   Large hiatal hernia noted...see pictures. 3.   There was evidence of suspected Barrett's esophagus at Ge junction; multiple biopsies 4.   Stricture was found at the gastroesophageal junction and dilated sucessfully  RECOMMENDATIONS: 1.  Await pathology results 2.  Dilatations PRN 3. Nexium 40 mg daily samples given 4. office f/u 3  weeks   _______________________________ eSigned:  Mardella Layman, MD, [facility] 07/16/2013 1:46 PM   standard discharge   PATIENT NAME:  Don Johnson, Don Johnson MR#: [id number]

## 2013-07-16 NOTE — Progress Notes (Signed)
  [facility] Anesthesia Post-op Note  Patient: Don Johnson  Procedure(s) Performed: endoscopy with dilatation  Patient Location: [facility] - Recovery Area  Anesthesia Type: Deep Sedation/Propofol  Level of Consciousness: awake, oriented and patient cooperative  Airway and Oxygen Therapy: Patient Spontanous Breathing  Post-op Pain: none  Post-op Assessment:  Post-op Vital signs reviewed, Patient's Cardiovascular Status Stable, Respiratory Function Stable, Patent Airway, No signs of Nausea or vomiting and Pain level controlled  Post-op Vital Signs: Reviewed and stable  Complications: No apparent anesthesia complications  Rylei Masella E 1:43 PM

## 2013-07-16 NOTE — Patient Instructions (Signed)
Discharge instructions given with verbal understanding. Handouts on a hiatal hernia and a dilatation diet. Resume previous medications. YOU HAD AN ENDOSCOPIC PROCEDURE TODAY AT THE [facility]: Refer to the procedure report that was given to you for any specific questions about what was found during the examination.  If the procedure report does not answer your questions, please call your gastroenterologist to clarify.  If you requested that your care partner not be given the details of your procedure findings, then the procedure report has been included in a sealed envelope for you to review at your convenience later.  YOU SHOULD EXPECT: Some feelings of bloating in the abdomen. Passage of more gas than usual.  Walking can help get rid of the air that was put into your GI tract during the procedure and reduce the bloating. If you had a lower endoscopy (such as a colonoscopy or flexible sigmoidoscopy) you may notice spotting of blood in your stool or on the toilet paper. If you underwent a bowel prep for your procedure, then you may not have a normal bowel movement for a few days.  DIET: Your first meal following the procedure should be a light meal and then it is ok to progress to your normal diet.  A half-sandwich or bowl of soup is an example of a good first meal.  Heavy or fried foods are harder to digest and may make you feel nauseous or bloated.  Likewise meals heavy in dairy and vegetables can cause extra gas to form and this can also increase the bloating.  Drink plenty of fluids but you should avoid alcoholic beverages for 24 hours.  ACTIVITY: Your care partner should take you home directly after the procedure.  You should plan to take it easy, moving slowly for the rest of the day.  You can resume normal activity the day after the procedure however you should NOT DRIVE or use heavy machinery for 24 hours (because of the sedation medicines used during the test).    SYMPTOMS TO REPORT  IMMEDIATELY: A gastroenterologist can be reached at any hour.  During normal business hours, 8:30 AM to 5:00 PM Monday through Friday, call [phone].  After hours and on weekends, please call the GI answering service at [phone] who will take a message and have the physician on call contact you.   Following upper endoscopy (EGD)  Vomiting of blood or coffee ground material  New chest pain or pain under the shoulder blades  Painful or persistently difficult swallowing  New shortness of breath  Fever of 100F or higher  Black, tarry-looking stools  FOLLOW UP: If any biopsies were taken you will be contacted by phone or by letter within the next 1-3 weeks.  Call your gastroenterologist if you have not heard about the biopsies in 3 weeks.  Our staff will call the home number listed on your records the next business day following your procedure to check on you and address any questions or concerns that you may have at that time regarding the information given to you following your procedure. This is a courtesy call and so if there is no answer at the home number and we have not heard from you through the emergency physician on call, we will assume that you have returned to your regular daily activities without incident.  SIGNATURES/CONFIDENTIALITY: You and/or your care partner have signed paperwork which will be entered into your electronic medical record.  These signatures attest to the fact that   that the information above on your After Visit Summary has been reviewed and is understood.  Full responsibility of the confidentiality of this discharge information lies with you and/or your care-partner. 

## 2013-07-16 NOTE — Progress Notes (Signed)
Called to room to assist during endoscopic procedure.  Patient ID and intended procedure confirmed with present staff. Received instructions for my participation in the procedure from the performing physician.  

## 2013-07-16 NOTE — Progress Notes (Signed)
I actually was called to the procedure room after the exam was completed.  Frutoso Chase, CMA relayed where the biopsy was taken from and also the pt had a esophageal dilatation. Maw

## 2013-07-16 NOTE — Progress Notes (Signed)
Patient did not experience any of the following events: a burn prior to discharge; a fall within the facility; wrong site/side/patient/procedure/implant event; or a hospital transfer or hospital admission upon discharge from the facility. (G8907) Patient did not have preoperative order for IV antibiotic SSI prophylaxis. (G8918)  

## 2013-07-17 ENCOUNTER — Telehealth: Payer: Self-pay

## 2013-07-17 NOTE — Telephone Encounter (Signed)
  Follow up Call-  Call back number 07/16/2013 03/13/2012  Post procedure Call Back phone  # [phone]  Permission to leave phone message Yes Yes     Patient questions:  Do you have a fever, pain , or abdominal swelling? no Pain Score  0 *  Have you tolerated food without any problems? yes  Have you been able to return to your normal activities? yes  Do you have any questions about your discharge instructions: Diet   no Medications  no Follow up visit  no  Do you have questions or concerns about your Care? no  Actions: * If pain score is 4 or above: No action needed, pain <4.  I spoke with the pt's wife.  "He did not even have a sore throat". Maw

## 2013-07-21 ENCOUNTER — Encounter: Payer: Self-pay | Admitting: Gastroenterology

## 2013-07-28 ENCOUNTER — Encounter: Payer: Self-pay | Admitting: *Deleted

## 2013-08-05 ENCOUNTER — Encounter: Payer: Self-pay | Admitting: Gastroenterology

## 2013-08-05 ENCOUNTER — Ambulatory Visit (INDEPENDENT_AMBULATORY_CARE_PROVIDER_SITE_OTHER): Payer: Medicare Other | Admitting: Gastroenterology

## 2013-08-05 VITALS — BP 110/70 | HR 68 | Ht 65.0 in | Wt 145.2 lb

## 2013-08-05 DIAGNOSIS — K222 Esophageal obstruction: Secondary | ICD-10-CM | POA: Diagnosis not present

## 2013-08-05 DIAGNOSIS — K219 Gastro-esophageal reflux disease without esophagitis: Secondary | ICD-10-CM | POA: Diagnosis not present

## 2013-08-05 MED ORDER — OMEPRAZOLE 40 MG PO CPDR: 40.0000 mg | DELAYED_RELEASE_CAPSULE | Freq: Every day | ORAL | Status: DC

## 2013-08-05 NOTE — Patient Instructions (Addendum)
Please call back in two weeks and ask for Dr. Norval Gable nurse Aram Beecham to give a report on how you are feeling  Information on Esophageal Stricture is below ___________________________________________________________________________________________   Esophageal Stricture The esophagus is the long, narrow tube which carries food and liquid from the mouth to the stomach. Sometimes a part of the esophagus becomes narrow and makes it difficult, painful, or even impossible to swallow. This is called an esophageal stricture.  CAUSES  Common causes of blockage or strictures of the esophagus are:  Exposure of the lower esophagus to the acid from the stomach may cause narrowing.  Hiatal hernia in which a small part of the stomach bulges up through the diaphragm can cause a narrowing in the bottom of the esophagus.  Scleroderma is a tissue disorder that affects the esophagus and makes swallowing difficult.  Achalasia is an absence of nerves in the lower esophagus and to the esophageal sphincter. This absence of nerves may be congenital (present since birth). This can cause irregular spasms which do not allow food and fluid through.  Strictures may develop from swallowing materials which damage the esophagus. Examples are acids or alkalis such as lye.  Schatzki's Ring is a narrow ring of non-cancerous tissue which narrows the lower esophagus. The cause of this is unknown.  Growths can block the esophagus. SYMPTOMS  Some of the problems are difficulty swallowing or pain with swallowing. DIAGNOSIS  Your caregiver often suspects this problem by taking a medical history. They will also do a physical exam. They may then take X-rays and/or perform an endoscopy. Endoscopy is an exam in which a tube like a small flexible telescope is used to look at your esophagus.  TREATMENT  One form of treatment is to dilate the narrow area. This means to stretch it.  When this is not successful, chest surgery may be  required. This is a much more extensive form of treatment with a longer recovery time. Both of the above treatments make the passage of food and water into the stomach easier. They also make it easier for stomach contents to bubble back into the esophagus. Special medications may be used following the procedure to help prevent further narrowing. Medications may be used to lower the amount of acid in the stomach juice.  SEEK IMMEDIATE MEDICAL CARE IF:   Your swallowing is becoming more painful, difficult, or you are unable to swallow.  You vomit up blood.  You develop black tarry stools.  You develop chills.  You have a fever.  You develop chest or abdominal pain.  You develop shortness of breath, feel lightheaded, or faint. Follow up with medical care as your caregiver suggests. Document Released: 05/08/2006 Document Revised: 11/20/2011 Document Reviewed: 06/14/2006 [facility] Patient Information 2014 [place], [place].

## 2013-08-05 NOTE — Progress Notes (Signed)
History of Present Illness: This is a 72 year old Caucasian male with a large hiatal hernia and chronic GERD with recurrent peptic strictures of his esophagus.  He underwent endoscopy and dilation on November 5, biopsy did not show Barrett's mucosa, and he is been on Nexium since that time and the morning.  His wife who is with him today relates that most of his reflux is at nighttime.  His dysphagia has improved but he still has occasional bouts of slow swallowing.  He has not inclined have another dilation at this time.    Current Medications, Allergies, Past Medical History, Past Surgical History, Family History and Social History were reviewed in [employer] record.  ROS: All systems were reviewed and are negative unless otherwise stated in the HPI.         Assessment and plan: Have switched him to Dexilant 60 mg a day for better acid reflux management.  He is to call in 2 weeks' time for progress report.  He may need further esophageal dilatations.  Reviewing his endoscopy shows a circumferential stricture which may require hydrostatic balloon dilation.  As per above, there was no evidence of Barrett's mucosa.  He is up-to-date on his colonoscopy exams.

## 2013-09-15 ENCOUNTER — Encounter: Payer: Medicare Other | Admitting: Gastroenterology

## 2014-03-10 DIAGNOSIS — H2510 Age-related nuclear cataract, unspecified eye: Secondary | ICD-10-CM | POA: Diagnosis not present

## 2014-03-10 DIAGNOSIS — H43399 Other vitreous opacities, unspecified eye: Secondary | ICD-10-CM | POA: Diagnosis not present

## 2014-06-26 ENCOUNTER — Other Ambulatory Visit: Payer: Self-pay

## 2014-07-08 ENCOUNTER — Other Ambulatory Visit (INDEPENDENT_AMBULATORY_CARE_PROVIDER_SITE_OTHER): Payer: Medicare Other

## 2014-07-08 ENCOUNTER — Encounter: Payer: Self-pay | Admitting: Internal Medicine

## 2014-07-08 ENCOUNTER — Ambulatory Visit (INDEPENDENT_AMBULATORY_CARE_PROVIDER_SITE_OTHER): Payer: Medicare Other | Admitting: Internal Medicine

## 2014-07-08 VITALS — BP 122/68 | HR 60 | Temp 98.2°F | Resp 16 | Wt 143.0 lb

## 2014-07-08 DIAGNOSIS — K2990 Gastroduodenitis, unspecified, without bleeding: Secondary | ICD-10-CM

## 2014-07-08 DIAGNOSIS — Z23 Encounter for immunization: Secondary | ICD-10-CM | POA: Diagnosis not present

## 2014-07-08 DIAGNOSIS — R739 Hyperglycemia, unspecified: Secondary | ICD-10-CM

## 2014-07-08 DIAGNOSIS — E785 Hyperlipidemia, unspecified: Secondary | ICD-10-CM

## 2014-07-08 DIAGNOSIS — N401 Enlarged prostate with lower urinary tract symptoms: Secondary | ICD-10-CM | POA: Diagnosis not present

## 2014-07-08 DIAGNOSIS — Z0000 Encounter for general adult medical examination without abnormal findings: Secondary | ICD-10-CM | POA: Diagnosis not present

## 2014-07-08 DIAGNOSIS — K2970 Gastritis, unspecified, without bleeding: Secondary | ICD-10-CM

## 2014-07-08 DIAGNOSIS — M19041 Primary osteoarthritis, right hand: Secondary | ICD-10-CM

## 2014-07-08 DIAGNOSIS — N138 Other obstructive and reflux uropathy: Secondary | ICD-10-CM

## 2014-07-08 DIAGNOSIS — M19042 Primary osteoarthritis, left hand: Secondary | ICD-10-CM

## 2014-07-08 LAB — CBC WITH DIFFERENTIAL/PLATELET
BASOS PCT: 0.9 % (ref 0.0–3.0)
Basophils Absolute: 0.1 10*3/uL (ref 0.0–0.1)
EOS PCT: 5.2 % — AB (ref 0.0–5.0)
Eosinophils Absolute: 0.3 10*3/uL (ref 0.0–0.7)
HCT: 45.2 % (ref 39.0–52.0)
Hemoglobin: 14.9 g/dL (ref 13.0–17.0)
LYMPHS PCT: 32.9 % (ref 12.0–46.0)
Lymphs Abs: 2 10*3/uL (ref 0.7–4.0)
MCHC: 33 g/dL (ref 30.0–36.0)
MCV: 92 fl (ref 78.0–100.0)
MONOS PCT: 13 % — AB (ref 3.0–12.0)
Monocytes Absolute: 0.8 10*3/uL (ref 0.1–1.0)
Neutro Abs: 2.9 10*3/uL (ref 1.4–7.7)
Neutrophils Relative %: 48 % (ref 43.0–77.0)
PLATELETS: 230 10*3/uL (ref 150.0–400.0)
RBC: 4.91 Mil/uL (ref 4.22–5.81)
RDW: 13.3 % (ref 11.5–15.5)
WBC: 6 10*3/uL (ref 4.0–10.5)

## 2014-07-08 LAB — COMPREHENSIVE METABOLIC PANEL
ALBUMIN: 3.7 g/dL (ref 3.5–5.2)
ALT: 19 U/L (ref 0–53)
AST: 28 U/L (ref 0–37)
Alkaline Phosphatase: 59 U/L (ref 39–117)
BUN: 16 mg/dL (ref 6–23)
CALCIUM: 9.2 mg/dL (ref 8.4–10.5)
CHLORIDE: 103 meq/L (ref 96–112)
CO2: 27 meq/L (ref 19–32)
CREATININE: 1.1 mg/dL (ref 0.4–1.5)
GFR: 68.2 mL/min (ref >60.00)
Glucose, Bld: 94 mg/dL (ref 70–99)
Potassium: 4.3 mEq/L (ref 3.5–5.1)
Sodium: 140 mEq/L (ref 135–145)
Total Bilirubin: 0.9 mg/dL (ref 0.2–1.2)
Total Protein: 7.1 g/dL (ref 6.0–8.3)

## 2014-07-08 LAB — LIPID PANEL
CHOLESTEROL: 219 mg/dL — AB (ref 0–200)
HDL: 65.8 mg/dL (ref >39.00)
LDL Cholesterol: 140 mg/dL — ABNORMAL HIGH (ref 0–99)
NonHDL: 153.2
TRIGLYCERIDES: 65 mg/dL (ref 0.0–149.0)
Total CHOL/HDL Ratio: 3
VLDL: 13 mg/dL (ref 0.0–40.0)

## 2014-07-08 LAB — PSA: PSA: 0.62 ng/mL (ref 0.10–4.00)

## 2014-07-08 LAB — HEMOGLOBIN A1C: HEMOGLOBIN A1C: 6.1 % (ref 4.6–6.5)

## 2014-07-08 LAB — TSH: TSH: 2.49 u[IU]/mL (ref 0.35–4.50)

## 2014-07-08 NOTE — Addendum Note (Signed)
Addended by: Estell Harpin T on: 07/08/2014 04:42 PM   Modules accepted: Orders

## 2014-07-08 NOTE — Progress Notes (Signed)
Subjective:    Patient ID: Don Johnson, male    DOB: April 18, 1941, 73 y.o.   MRN: [id number]  Hyperlipidemia This is a chronic problem. The current episode started more than 1 year ago. The problem is controlled. Recent lipid tests were reviewed and are variable. He has no history of chronic renal disease, diabetes, hypothyroidism, liver disease, obesity or nephrotic syndrome. There are no known factors aggravating his hyperlipidemia. Pertinent negatives include no chest pain, focal sensory loss, focal weakness, leg pain, myalgias or shortness of breath. Current antihyperlipidemic treatment includes statins. The current treatment provides moderate improvement of lipids. Compliance problems include psychosocial issues.       Review of Systems  Constitutional: Negative.  Negative for fever, chills, diaphoresis, appetite change and fatigue.  HENT: Negative.   Eyes: Negative.   Respiratory: Negative.  Negative for cough, choking, chest tightness, shortness of breath and stridor.   Cardiovascular: Negative.  Negative for chest pain, palpitations and leg swelling.  Gastrointestinal: Negative.  Negative for nausea, vomiting, abdominal pain, diarrhea, constipation and blood in stool.  Endocrine: Negative.   Genitourinary: Negative.   Musculoskeletal: Negative.  Negative for arthralgias, back pain, myalgias and neck pain.  Skin: Negative.   Allergic/Immunologic: Negative.   Neurological: Negative.  Negative for dizziness, focal weakness, syncope, speech difficulty, weakness, light-headedness, numbness and headaches.  Hematological: Negative.  Negative for adenopathy. Does not bruise/bleed easily.  Psychiatric/Behavioral: Negative.        Objective:   Physical Exam  Vitals reviewed. Constitutional: He is oriented to person, place, and time. He appears well-developed and well-nourished. No distress.  HENT:  Head: Normocephalic and atraumatic.  Mouth/Throat: Oropharynx is clear and moist. No  oropharyngeal exudate.  Eyes: Conjunctivae are normal. Right eye exhibits no discharge. Left eye exhibits no discharge. No scleral icterus.  Neck: Normal range of motion. Neck supple. No JVD present. No tracheal deviation present. No thyromegaly present.  Cardiovascular: Normal rate, regular rhythm, normal heart sounds and intact distal pulses.  Exam reveals no gallop and no friction rub.   No murmur heard. Pulmonary/Chest: Effort normal and breath sounds normal. No stridor. No respiratory distress. He has no wheezes. He has no rales. He exhibits no tenderness.  Abdominal: Soft. Bowel sounds are normal. He exhibits no distension and no mass. There is no tenderness. There is no rebound and no guarding. Hernia confirmed negative in the right inguinal area and confirmed negative in the left inguinal area.  Genitourinary: Rectum normal, prostate normal, testes normal and penis normal. Rectal exam shows no external hemorrhoid, no internal hemorrhoid, no fissure, no mass, no tenderness and anal tone normal. Guaiac negative stool. Prostate is not enlarged and not tender. Right testis shows no mass, no swelling and no tenderness. Right testis is descended. Left testis shows no mass, no swelling and no tenderness. Left testis is descended. Uncircumcised. No phimosis, paraphimosis, hypospadias, penile erythema or penile tenderness. No discharge found.  Musculoskeletal: Normal range of motion. He exhibits no edema and no tenderness.  Lymphadenopathy:    He has no cervical adenopathy.       Right: No inguinal adenopathy present.       Left: No inguinal adenopathy present.  Neurological: He is oriented to person, place, and time.  Skin: Skin is warm and dry. No rash noted. He is not diaphoretic. No erythema. No pallor.  Psychiatric: He has a normal mood and affect. His behavior is normal. Judgment and thought content normal.     Lab Results  Component Value Date   WBC 6.4 06/30/2013   HGB 14.2 06/30/2013    HCT 42.4 06/30/2013   PLT 284.0 06/30/2013   GLUCOSE 96 06/30/2013   CHOL 230* 06/30/2013   TRIG 115.0 06/30/2013   HDL 61.00 06/30/2013   LDLDIRECT 149.1 06/30/2013   LDLCALC 122* 04/12/2011   ALT 17 06/30/2013   AST 25 06/30/2013   NA 141 06/30/2013   K 4.5 06/30/2013   CL 105 06/30/2013   CREATININE 1.0 06/30/2013   BUN 16 06/30/2013   CO2 32 06/30/2013   TSH 2.43 06/30/2013   PSA 0.59 06/30/2013   HGBA1C 6.3 06/30/2013       Assessment & Plan:

## 2014-07-08 NOTE — Assessment & Plan Note (Signed)
I will check his A1C to see if he has developed DM2 

## 2014-07-08 NOTE — Patient Instructions (Signed)
High Cholesterol High cholesterol refers to having a high level of cholesterol in your blood. Cholesterol is a white, waxy, fat-like protein that your body needs in small amounts. Your liver makes all the cholesterol you need. Excess cholesterol comes from the food you eat. Cholesterol travels in your bloodstream through your blood vessels. If you have high cholesterol, deposits (plaque) may build up on the walls of your blood vessels. This makes the arteries narrower and stiffer. Plaque increases your risk of heart attack and stroke. Work with your health care provider to keep your cholesterol levels in a healthy range. RISK FACTORS Several things can make you more likely to have high cholesterol. These include:   Eating foods high in animal fat (saturated fat) or cholesterol.  Being overweight.  Not getting enough exercise.  Having a family history of high cholesterol. SIGNS AND SYMPTOMS High cholesterol does not cause symptoms. DIAGNOSIS  Your health care provider can do a blood test to check whether you have high cholesterol. If you are older than 20, your health care provider may check your cholesterol every 4-6 years. You may be checked more often if you already have high cholesterol or other risk factors for heart disease. The blood test for cholesterol measures the following:  Bad cholesterol (LDL cholesterol). This is the type of cholesterol that causes heart disease. This number should be less than 100.  Good cholesterol (HDL cholesterol). This type helps protect against heart disease. A healthy level of HDL cholesterol is 60 or higher.  Total cholesterol. This is the combined number of LDL cholesterol and HDL cholesterol. A healthy number is less than 200. TREATMENT  High cholesterol can be treated with diet changes, lifestyle changes, and medicine.   Diet changes may include eating more whole grains, fruits, vegetables, nuts, and fish. You may also have to cut back on red meat  and foods with a lot of added sugar.  Lifestyle changes may include getting at least 40 minutes of aerobic exercise three times a week. Aerobic exercises include walking, biking, and swimming. Aerobic exercise along with a healthy diet can help you maintain a healthy weight. Lifestyle changes may also include quitting smoking.  If diet and lifestyle changes are not enough to lower your cholesterol, your health care provider may prescribe a statin medicine. This medicine has been shown to lower cholesterol and also lower the risk of heart disease. HOME CARE INSTRUCTIONS  Only take over-the-counter or prescription medicines as directed by your health care provider.   Follow a healthy diet as directed by your health care provider. For instance:   Eat chicken (without skin), fish, veal, shellfish, ground turkey breast, and round or loin cuts of red meat.  Do not eat fried foods and fatty meats, such as hot dogs and salami.   Eat plenty of fruits, such as apples.   Eat plenty of vegetables, such as broccoli, potatoes, and carrots.   Eat beans, peas, and lentils.   Eat grains, such as barley, rice, couscous, and bulgur wheat.   Eat pasta without cream sauces.   Use skim or nonfat milk and low-fat or nonfat yogurt and cheeses. Do not eat or drink whole milk, cream, ice cream, egg yolks, and hard cheeses.   Do not eat stick margarine or tub margarines that contain trans fats (also called partially hydrogenated oils).   Do not eat cakes, cookies, crackers, or other baked goods that contain trans fats.   Do not eat saturated tropical oils, such as   coconut and palm oil.   Exercise as directed by your health care provider. Increase your activity level with activities such as gardening or walking.   Keep all follow-up appointments.  SEEK MEDICAL CARE IF:  You are struggling to maintain a healthy diet or weight.  You need help starting an exercise program.  You need help to  stop smoking. SEEK IMMEDIATE MEDICAL CARE IF:  You have chest pain.  You have trouble breathing. Document Released: 08/28/2005 Document Revised: 01/12/2014 Document Reviewed: 06/20/2013 Exit[employer] Patient Information 2015 [employer]. This information is not intended to replace advice given to you by your health care provider. Make sure you discuss any questions you have with your health care provider. Health Maintenance A healthy lifestyle and preventative care can promote health and wellness.  Maintain regular health, dental, and eye exams.  Eat a healthy diet. Foods like vegetables, fruits, whole grains, low-fat dairy products, and lean protein foods contain the nutrients you need and are low in calories. Decrease your intake of foods high in solid fats, added sugars, and salt. Get information about a proper diet from your health care provider, if necessary.  Regular physical exercise is one of the most important things you can do for your health. Most adults should get at least 150 minutes of moderate-intensity exercise (any activity that increases your heart rate and causes you to sweat) each week. In addition, most adults need muscle-strengthening exercises on 2 or more days a week.   Maintain a healthy weight. The body mass index (BMI) is a screening tool to identify possible weight problems. It provides an estimate of body fat based on height and weight. Your health care provider can find your BMI and can help you achieve or maintain a healthy weight. For males 20 years and older:  A BMI below 18.5 is considered underweight.  A BMI of 18.5 to 24.9 is normal.  A BMI of 25 to 29.9 is considered overweight.  A BMI of 30 and above is considered obese.  Maintain normal blood lipids and cholesterol by exercising and minimizing your intake of saturated fat. Eat a balanced diet with plenty of fruits and vegetables. Blood tests for lipids and cholesterol should begin at age 20 and be  repeated every 5 years. If your lipid or cholesterol levels are high, you are over age 50, or you are at high risk for heart disease, you may need your cholesterol levels checked more frequently.Ongoing high lipid and cholesterol levels should be treated with medicines if diet and exercise are not working.  If you smoke, find out from your health care provider how to quit. If you do not use tobacco, do not start.  Lung cancer screening is recommended for adults aged 55-80 years who are at high risk for developing lung cancer because of a history of smoking. A yearly low-dose CT scan of the lungs is recommended for people who have at least a 30-pack-year history of smoking and are current smokers or have quit within the past 15 years. A pack year of smoking is smoking an average of 1 pack of cigarettes a day for 1 year (for example, a 30-pack-year history of smoking could mean smoking 1 pack a day for 30 years or 2 packs a day for 15 years). Yearly screening should continue until the smoker has stopped smoking for at least 15 years. Yearly screening should be stopped for people who develop a health problem that would prevent them from having lung cancer treatment.    If you choose to drink alcohol, do not have more than 2 drinks per day. One drink is considered to be 12 oz (360 mL) of beer, 5 oz (150 mL) of wine, or 1.5 oz (45 mL) of liquor.  Avoid the use of street drugs. Do not share needles with anyone. Ask for help if you need support or instructions about stopping the use of drugs.  High blood pressure causes heart disease and increases the risk of stroke. Blood pressure should be checked at least every 1-2 years. Ongoing high blood pressure should be treated with medicines if weight loss and exercise are not effective.  If you are 45-79 years old, ask your health care provider if you should take aspirin to prevent heart disease.  Diabetes screening involves taking a blood sample to check your  fasting blood sugar level. This should be done once every 3 years after age 45 if you are at a normal weight and without risk factors for diabetes. Testing should be considered at a younger age or be carried out more frequently if you are overweight and have at least 1 risk factor for diabetes.  Colorectal cancer can be detected and often prevented. Most routine colorectal cancer screening begins at the age of 50 and continues through age 75. However, your health care provider may recommend screening at an earlier age if you have risk factors for colon cancer. On a yearly basis, your health care provider may provide home test kits to check for hidden blood in the stool. A small camera at the end of a tube may be used to directly examine the colon (sigmoidoscopy or colonoscopy) to detect the earliest forms of colorectal cancer. Talk to your health care provider about this at age 50 when routine screening begins. A direct exam of the colon should be repeated every 5-10 years through age 75, unless early forms of precancerous polyps or small growths are found.  People who are at an increased risk for hepatitis B should be screened for this virus. You are considered at high risk for hepatitis B if:  You were born in a country where hepatitis B occurs often. Talk with your health care provider about which countries are considered high risk.  Your parents were born in a high-risk country and you have not received a shot to protect against hepatitis B (hepatitis B vaccine).  You have HIV or AIDS.  You use needles to inject street drugs.  You live with, or have sex with, someone who has hepatitis B.  You are a man who has sex with other men (MSM).  You get hemodialysis treatment.  You take certain medicines for conditions like cancer, organ transplantation, and autoimmune conditions.  Hepatitis C blood testing is recommended for all people born from 1945 through 1965 and any individual with known risk  factors for hepatitis C.  Healthy men should no longer receive prostate-specific antigen (PSA) blood tests as part of routine cancer screening. Talk to your health care provider about prostate cancer screening.  Testicular cancer screening is not recommended for adolescents or adult males who have no symptoms. Screening includes self-exam, a health care provider exam, and other screening tests. Consult with your health care provider about any symptoms you have or any concerns you have about testicular cancer.  Practice safe sex. Use condoms and avoid high-risk sexual practices to reduce the spread of sexually transmitted infections (STIs).  You should be screened for STIs, including gonorrhea and chlamydia if:  You   are sexually active and are younger than 24 years.  You are older than 24 years, and your health care provider tells you that you are at risk for this type of infection.  Your sexual activity has changed since you were last screened, and you are at an increased risk for chlamydia or gonorrhea. Ask your health care provider if you are at risk.  If you are at risk of being infected with HIV, it is recommended that you take a prescription medicine daily to prevent HIV infection. This is called pre-exposure prophylaxis (PrEP). You are considered at risk if:  You are a man who has sex with other men (MSM).  You are a heterosexual man who is sexually active with multiple partners.  You take drugs by injection.  You are sexually active with a partner who has HIV.  Talk with your health care provider about whether you are at high risk of being infected with HIV. If you choose to begin PrEP, you should first be tested for HIV. You should then be tested every 3 months for as long as you are taking PrEP.  Use sunscreen. Apply sunscreen liberally and repeatedly throughout the day. You should seek shade when your shadow is shorter than you. Protect yourself by wearing long sleeves, pants, a  wide-brimmed hat, and sunglasses year round whenever you are outdoors.  Tell your health care provider of new moles or changes in moles, especially if there is a change in shape or color. Also, tell your health care provider if a mole is larger than the size of a pencil eraser.  A one-time screening for abdominal aortic aneurysm (AAA) and surgical repair of large AAAs by ultrasound is recommended for men aged 65-75 years who are current or former smokers.  Stay current with your vaccines (immunizations). Document Released: 02/24/2008 Document Revised: 09/02/2013 Document Reviewed: 01/23/2011 Exit[employer] Patient Information 2015 [employer]. This information is not intended to replace advice given to you by your health care provider. Make sure you discuss any questions you have with your health care provider.  

## 2014-07-08 NOTE — Assessment & Plan Note (Signed)
I have asked him to restart crestor Will recheck his FLP today as well as his TSH and CMP

## 2014-07-08 NOTE — Assessment & Plan Note (Signed)
He has no s/s related to this I will screen him for prostate cancer

## 2014-07-08 NOTE — Assessment & Plan Note (Addendum)

## 2015-02-10 ENCOUNTER — Encounter: Payer: Self-pay | Admitting: Gastroenterology

## 2015-03-08 ENCOUNTER — Other Ambulatory Visit: Payer: Self-pay

## 2015-07-29 ENCOUNTER — Other Ambulatory Visit (INDEPENDENT_AMBULATORY_CARE_PROVIDER_SITE_OTHER): Payer: Medicare Other

## 2015-07-29 ENCOUNTER — Encounter: Payer: Self-pay | Admitting: Internal Medicine

## 2015-07-29 ENCOUNTER — Ambulatory Visit (INDEPENDENT_AMBULATORY_CARE_PROVIDER_SITE_OTHER)
Admission: RE | Admit: 2015-07-29 | Discharge: 2015-07-29 | Disposition: A | Discharge status: Home / Self Care | Payer: Medicare Other | Source: Ambulatory Visit | Attending: Internal Medicine | Admitting: Internal Medicine

## 2015-07-29 ENCOUNTER — Ambulatory Visit (INDEPENDENT_AMBULATORY_CARE_PROVIDER_SITE_OTHER): Payer: Medicare Other | Admitting: Internal Medicine

## 2015-07-29 VITALS — BP 132/80 | HR 60 | Temp 97.8°F | Resp 16 | Ht 65.0 in | Wt 146.0 lb

## 2015-07-29 DIAGNOSIS — N401 Enlarged prostate with lower urinary tract symptoms: Secondary | ICD-10-CM

## 2015-07-29 DIAGNOSIS — M542 Cervicalgia: Secondary | ICD-10-CM

## 2015-07-29 DIAGNOSIS — M5030 Other cervical disc degeneration, unspecified cervical region: Secondary | ICD-10-CM | POA: Insufficient documentation

## 2015-07-29 DIAGNOSIS — K469 Unspecified abdominal hernia without obstruction or gangrene: Secondary | ICD-10-CM | POA: Insufficient documentation

## 2015-07-29 DIAGNOSIS — Z23 Encounter for immunization: Secondary | ICD-10-CM | POA: Diagnosis not present

## 2015-07-29 DIAGNOSIS — K4020 Bilateral inguinal hernia, without obstruction or gangrene, not specified as recurrent: Secondary | ICD-10-CM

## 2015-07-29 DIAGNOSIS — N138 Other obstructive and reflux uropathy: Secondary | ICD-10-CM

## 2015-07-29 DIAGNOSIS — R739 Hyperglycemia, unspecified: Secondary | ICD-10-CM

## 2015-07-29 DIAGNOSIS — E785 Hyperlipidemia, unspecified: Secondary | ICD-10-CM

## 2015-07-29 DIAGNOSIS — M47812 Spondylosis without myelopathy or radiculopathy, cervical region: Secondary | ICD-10-CM | POA: Diagnosis not present

## 2015-07-29 DIAGNOSIS — Z0000 Encounter for general adult medical examination without abnormal findings: Secondary | ICD-10-CM | POA: Diagnosis not present

## 2015-07-29 LAB — PSA: PSA: 0.48 ng/mL (ref 0.10–4.00)

## 2015-07-29 LAB — CBC WITH DIFFERENTIAL/PLATELET
BASOS PCT: 0.6 % (ref 0.0–3.0)
Basophils Absolute: 0 10*3/uL (ref 0.0–0.1)
EOS PCT: 4.8 % (ref 0.0–5.0)
Eosinophils Absolute: 0.3 10*3/uL (ref 0.0–0.7)
HEMATOCRIT: 45.7 % (ref 39.0–52.0)
HEMOGLOBIN: 15.2 g/dL (ref 13.0–17.0)
LYMPHS PCT: 33.1 % (ref 12.0–46.0)
Lymphs Abs: 2.3 10*3/uL (ref 0.7–4.0)
MCHC: 33.2 g/dL (ref 30.0–36.0)
MCV: 91.9 fl (ref 78.0–100.0)
Monocytes Absolute: 0.8 10*3/uL (ref 0.1–1.0)
Monocytes Relative: 12.3 % — ABNORMAL HIGH (ref 3.0–12.0)
NEUTROS ABS: 3.4 10*3/uL (ref 1.4–7.7)
Neutrophils Relative %: 49.2 % (ref 43.0–77.0)
PLATELETS: 240 10*3/uL (ref 150.0–400.0)
RBC: 4.98 Mil/uL (ref 4.22–5.81)
RDW: 13.5 % (ref 11.5–15.5)
WBC: 6.9 10*3/uL (ref 4.0–10.5)

## 2015-07-29 LAB — COMPREHENSIVE METABOLIC PANEL
ALBUMIN: 4.4 g/dL (ref 3.5–5.2)
ALT: 17 U/L (ref 0–53)
AST: 21 U/L (ref 0–37)
Alkaline Phosphatase: 59 U/L (ref 39–117)
BUN: 24 mg/dL — AB (ref 6–23)
CHLORIDE: 104 meq/L (ref 96–112)
CO2: 29 meq/L (ref 19–32)
CREATININE: 1.01 mg/dL (ref 0.40–1.50)
Calcium: 9.4 mg/dL (ref 8.4–10.5)
GFR: 76.62 mL/min (ref >60.00)
Glucose, Bld: 94 mg/dL (ref 70–99)
POTASSIUM: 4.5 meq/L (ref 3.5–5.1)
SODIUM: 141 meq/L (ref 135–145)
Total Bilirubin: 0.8 mg/dL (ref 0.2–1.2)
Total Protein: 6.9 g/dL (ref 6.0–8.3)

## 2015-07-29 LAB — LIPID PANEL
CHOL/HDL RATIO: 4
CHOLESTEROL: 225 mg/dL — AB (ref 0–200)
HDL: 55 mg/dL (ref >39.00)
LDL CALC: 154 mg/dL — AB (ref 0–99)
NonHDL: 170.31
Triglycerides: 83 mg/dL (ref 0.0–149.0)
VLDL: 16.6 mg/dL (ref 0.0–40.0)

## 2015-07-29 LAB — TSH: TSH: 2.33 u[IU]/mL (ref 0.35–4.50)

## 2015-07-29 LAB — FECAL OCCULT BLOOD, GUAIAC: Fecal Occult Blood: NEGATIVE

## 2015-07-29 LAB — HEMOGLOBIN A1C: HEMOGLOBIN A1C: 6.1 % (ref 4.6–6.5)

## 2015-07-29 NOTE — Progress Notes (Signed)
Subjective:  Patient ID: Don Johnson, male    DOB: [birth year]  Age: 74 y.o. MRN: [id number]  CC: Annual Exam and Neck Pain   HPI Don Johnson presents for a CPX but he also complains of worsening right neck pain for over a year. The pain radiates into his RUE and he has noticed atrophy in his right biceps. He remains concerned about hernias over bilateral inguinal areas, L>R.  Outpatient Prescriptions Prior to Visit  Medication Sig Dispense Refill  . aspirin (BAYER LOW STRENGTH) 81 MG EC tablet 2 tablets by mouth once daily     . famotidine (PEPCID) 10 MG tablet Take 10 mg by mouth at bedtime as needed.    . Multiple Vitamins-Minerals (CENTRUM PO) Take 1 tablet by mouth daily.      . Psyllium (METAMUCIL PO) Take 1 scoop by mouth 2 (two) times daily.    Marland Kitchen omeprazole (PRILOSEC) 40 MG capsule Take 1 capsule (40 mg total) by mouth daily. 30 capsule 6  . rosuvastatin (CRESTOR) 20 MG tablet Take 20 mg by mouth daily.     No facility-administered medications prior to visit.    ROS Review of Systems  Constitutional: Negative for fever, chills, diaphoresis, appetite change and fatigue.  HENT: Negative.   Eyes: Negative.   Respiratory: Negative.  Negative for cough, choking, chest tightness, shortness of breath and stridor.   Cardiovascular: Negative.  Negative for chest pain, palpitations and leg swelling.  Gastrointestinal: Negative.  Negative for nausea, vomiting, abdominal pain, diarrhea, constipation and blood in stool.  Endocrine: Negative.   Genitourinary: Negative.   Musculoskeletal: Positive for neck pain. Negative for myalgias, back pain, joint swelling, gait problem and neck stiffness.  Skin: Negative.  Negative for pallor and rash.  Allergic/Immunologic: Negative.   Neurological: Positive for weakness. Negative for dizziness, tremors, syncope, speech difficulty, light-headedness, numbness and headaches.  Hematological: Negative.  Negative for adenopathy. Does not  bruise/bleed easily.  Psychiatric/Behavioral: Negative.     Objective:  BP 132/80 mmHg  Pulse 60  Temp(Src) 97.8 F (36.6 C) (Oral)  Resp 16  Ht 5\' 5"  (1.651 m)  Wt 146 lb (66.225 kg)  BMI 24.30 kg/m2  SpO2 95%  BP Readings from Last 3 Encounters:  07/29/15 132/80  07/08/14 122/68  08/05/13 110/70    Wt Readings from Last 3 Encounters:  07/29/15 146 lb (66.225 kg)  07/08/14 143 lb (64.864 kg)  08/05/13 145 lb 3.2 oz (65.862 kg)    Physical Exam  Constitutional: No distress.  HENT:  Head: Normocephalic and atraumatic.  Mouth/Throat: Oropharynx is clear and moist. No oropharyngeal exudate.  Eyes: Conjunctivae are normal. Right eye exhibits no discharge. Left eye exhibits no discharge. No scleral icterus.  Neck: Normal range of motion. Neck supple. No JVD present. No tracheal deviation present. No thyromegaly present.  Cardiovascular: Normal rate, regular rhythm and intact distal pulses.  Exam reveals no gallop and no friction rub.   No murmur heard. Pulmonary/Chest: Breath sounds normal. No stridor. No respiratory distress. He has no wheezes. He has no rales. He exhibits no tenderness.  Abdominal: Soft. Normal appearance and bowel sounds are normal. He exhibits no distension and no mass. There is no splenomegaly or hepatomegaly. There is no tenderness. There is no rebound, no guarding and no CVA tenderness. A hernia is present. Hernia confirmed positive in the right inguinal area and confirmed positive in the left inguinal area. Hernia confirmed negative in the ventral area.  There are bilateral direct inguinal  hernias that are only apparent with valsalva maneuver.  Genitourinary: Rectum normal, testes normal and penis normal. Rectal exam shows no external hemorrhoid, no internal hemorrhoid, no fissure, no mass, no tenderness and anal tone normal. Guaiac negative stool. Prostate is enlarged (1+ smooth symm BPH). Prostate is not tender. Right testis shows no mass, no swelling and  no tenderness. Right testis is descended. Left testis shows no mass, no swelling and no tenderness. Left testis is descended. Uncircumcised. No phimosis, paraphimosis, hypospadias, penile erythema or penile tenderness. No discharge found.  Musculoskeletal: Normal range of motion. He exhibits no edema or tenderness.  Lymphadenopathy:    He has no cervical adenopathy.       Right: No inguinal adenopathy present.       Left: No inguinal adenopathy present.  Neurological: He displays atrophy. He displays no tremor and normal reflexes. No cranial nerve deficit or sensory deficit. He exhibits abnormal muscle tone. He displays a negative Romberg sign. He displays no seizure activity. Coordination and gait normal.  Reflex Scores:      Tricep reflexes are 1+ on the right side and 1+ on the left side.      Bicep reflexes are 1+ on the right side and 1+ on the left side.      Brachioradialis reflexes are 1+ on the right side and 1+ on the left side.      Patellar reflexes are 1+ on the right side and 1+ on the left side.      Achilles reflexes are 1+ on the right side and 1+ on the left side. Skin: Skin is warm and dry. No rash noted. He is not diaphoretic. No erythema. No pallor.  Psychiatric: His behavior is normal. Judgment and thought content normal.  Vitals reviewed.   Lab Results  Component Value Date   WBC 6.9 07/29/2015   HGB 15.2 07/29/2015   HCT 45.7 07/29/2015   PLT 240.0 07/29/2015   GLUCOSE 94 07/29/2015   CHOL 225* 07/29/2015   TRIG 83.0 07/29/2015   HDL 55.00 07/29/2015   LDLDIRECT 149.1 06/30/2013   LDLCALC 154* 07/29/2015   ALT 17 07/29/2015   AST 21 07/29/2015   NA 141 07/29/2015   K 4.5 07/29/2015   CL 104 07/29/2015   CREATININE 1.01 07/29/2015   BUN 24* 07/29/2015   CO2 29 07/29/2015   TSH 2.33 07/29/2015   PSA 0.48 07/29/2015   HGBA1C 6.1 07/29/2015    Dg Chest 2 View  06/04/2013  CLINICAL DATA:  Pneumonia, followup EXAM: CHEST  2 VIEW COMPARISON:  Chest x-ray  of 05/20/2013 FINDINGS: The lungs are better aerated. The opacity question previously at the right lung base has resolved. Mediastinal contours appear normal. The heart is mildly enlarged and stable. IMPRESSION: Improved aeration. No active lung disease. Electronically Signed   By: Ivar Drape M.D.   On: 06/04/2013 08:45   Dg Cervical Spine Complete  07/29/2015  CLINICAL DATA:  Chronic right-sided neck pain.  No injury. EXAM: CERVICAL SPINE - COMPLETE 4+ VIEW COMPARISON:  None. FINDINGS: Vertebral body heights are within normal. There is mild spondylosis throughout the cervical spine worse over the mid to lower spine. Minimal 2 mm anterior subluxation of C7 on T1 likely degenerative due to the moderate facet arthropathy. There is moderate disc space narrowing at the C5-6 and C6-7 levels. Prevertebral soft tissues are within normal. There is mild right-sided neural foraminal narrowing at the C3-4 and C7-T1 levels. Left neural foramina are patent. There is moderate  uncovertebral joint spurring the atlantoaxial articulation is within normal. IMPRESSION: Mild to moderate spondylosis of the cervical spine with moderate disc disease at the C5-6 and C6-7 levels. Mild right-sided neural foraminal narrowing at the C3-4 and C7-T1 levels. Subtle degenerative 2 mm anterior subluxation of C7 on T1. Electronically Signed   By: Marin Olp M.D.   On: 07/29/2015 09:39   Assessment & Plan:   Taquon was seen today for annual exam and neck pain.  Diagnoses and all orders for this visit:  Hyperlipidemia with target LDL less than 130- Framingham risk score is 12% but he is not willing to start a statin -     Lipid panel; Future -     CBC with Differential/Platelet; Future -     TSH; Future  Hyperglycemia- he has prediabetes and will work on his lifestyle modifications -     Comprehensive metabolic panel; Future -     Hemoglobin A1c; Future  BPH (benign prostatic hypertrophy) with urinary obstruction -     PSA;  Future  Need for influenza vaccination -     Flu vaccine HIGH DOSE PF  Need for 23-polyvalent pneumococcal polysaccharide vaccine -     Pneumococcal polysaccharide vaccine 23-valent greater than or equal to 2yo subcutaneous/IM  Neck pain on right side- plain films show DDD and neural foraminal narrowing at C3-4 and C7-T1, I have asked him to return to consider scheduling an MRI -     DG Cervical Spine Complete; Future  Bilateral inguinal hernia without obstruction or gangrene, recurrence not specified -     Ambulatory referral to General Surgery   I have discontinued Mr. Go's rosuvastatin and omeprazole. I am also having him maintain his aspirin, Multiple Vitamins-Minerals (CENTRUM PO), famotidine, and Psyllium (METAMUCIL PO).  No orders of the defined types were placed in this encounter.     Follow-up: Return in about 3 months (around 10/29/2015).  Scarlette Calico, MD

## 2015-07-29 NOTE — Patient Instructions (Signed)

## 2015-07-29 NOTE — Progress Notes (Signed)
Pre visit review using our clinic review tool, if applicable. No additional management support is needed unless otherwise documented below in the visit note. 

## 2015-08-01 NOTE — Assessment & Plan Note (Signed)

## 2015-08-09 ENCOUNTER — Encounter: Payer: Self-pay | Admitting: Internal Medicine

## 2015-08-16 ENCOUNTER — Other Ambulatory Visit: Payer: Self-pay | Admitting: Surgery

## 2015-08-16 DIAGNOSIS — K4021 Bilateral inguinal hernia, without obstruction or gangrene, recurrent: Secondary | ICD-10-CM | POA: Diagnosis not present

## 2015-12-30 ENCOUNTER — Encounter: Payer: Self-pay | Admitting: Internal Medicine

## 2015-12-30 ENCOUNTER — Ambulatory Visit (INDEPENDENT_AMBULATORY_CARE_PROVIDER_SITE_OTHER): Payer: Medicare Other | Admitting: Internal Medicine

## 2015-12-30 VITALS — BP 164/80 | HR 60 | Ht 65.5 in | Wt 148.5 lb

## 2015-12-30 DIAGNOSIS — K2100 Gastro-esophageal reflux disease with esophagitis, without bleeding: Secondary | ICD-10-CM

## 2015-12-30 DIAGNOSIS — K222 Esophageal obstruction: Secondary | ICD-10-CM | POA: Diagnosis not present

## 2015-12-30 DIAGNOSIS — R1310 Dysphagia, unspecified: Secondary | ICD-10-CM

## 2015-12-30 MED ORDER — OMEPRAZOLE 40 MG PO CPDR: 40.0000 mg | DELAYED_RELEASE_CAPSULE | Freq: Every day | ORAL | Status: DC

## 2015-12-30 NOTE — Progress Notes (Signed)
HISTORY OF PRESENT ILLNESS:  Don Johnson is a 75 y.o. male previous patient Dr. Verl Blalock who is new to me today. His chief complaint is worsening intermittent solid food dysphagia. He is accompanied by his wife. He is known to have GERD with peptic stricture requiring dilation and large hiatal hernia. Previous upper endoscopy 07/16/2013 revealed such. The esophagus was dilated to a maximal diameter 52 [place] with Maloney dilator. Thereafter he did have improvement in dysphagia until proximal me one year ago when he had intermittent difficulties. This culminated in 3 episodes of significant transient food impaction several weeks ago while at the beach. Patient does have intermittent indigestion and heartburn but only takes low-dose famotidine infrequently. Not clear if he has ever been on PPI therapy regularly. His GI review of systems is otherwise negative. He did undergo complete colonoscopy in July 2013 which revealed diverticulosis but no polyps. He is said to have a history of adenomatous colon polyps and a follow-up in 5 years from that exam was recommended.  REVIEW OF SYSTEMS:  All non-GI ROS negative except for back pain, muscle cramps, urinary frequency, fatigue  Past Medical History  Diagnosis Date  . HYPERLIPIDEMIA 09/28/2008  . ESOPHAGEAL STRICTURE 12/14/2008  . HIATAL HERNIA WITH REFLUX 12/14/2008  . DIVERTICULOSIS, COLON 12/14/2008  . HYPERTROPHY PROSTATE W/UR OBST & OTH LUTS 12/03/2008  . DEGENERATIVE JOINT DISEASE, HANDS 12/03/2008  . COLONIC POLYPS, ADENOMATOUS, HX OF 12/14/2008  . Pneumonia   . GERD (gastroesophageal reflux disease)   . Bilateral inguinal hernia     Past Surgical History  Procedure Laterality Date  . Hemorroidectomy    . Inguinal hernia repair Bilateral   . Umbilical hernia repair      Social History OVA VOTH  reports that he has never smoked. He has never used smokeless tobacco. He reports that he does not drink alcohol or use illicit  drugs.  family history includes Arthritis in his mother; Colon polyps in his mother; Diabetes in his mother; Hyperlipidemia in his mother; Hypertension in his mother.  No Known Allergies     PHYSICAL EXAMINATION: Vital signs: BP 164/80 mmHg  Pulse 60  Ht 5' 5.5" (1.664 m)  Wt 148 lb 8 oz (67.359 kg)  BMI 24.33 kg/m2  Constitutional: generally well-appearing, no acute distress Psychiatric: alert and oriented x3, cooperative Eyes: extraocular movements intact, anicteric, conjunctiva pink Mouth: oral pharynx moist, no lesions. No thrush Neck: suppleWithout thyromegaly Lymph: no supraclavicular lymphadenopathy Cardiovascular: heart regular rate and rhythm, no murmur Lungs: clear to auscultation bilaterally Abdomen: soft, nontender, nondistended, no obvious ascites, no peritoneal signs, normal bowel sounds, no organomegaly Rectal:Omitted Extremities: no clubbing cyanosis or lower extremity edema bilaterally Skin: no lesions on visible extremities Neuro: No focal deficits. Cranial nerves intact. Normal DTRs  ASSESSMENT:  #1. GERD complicated by peptic stricture and associated large hiatal hernia #2. Dysphagia secondary to peptic stricture #3. History of adenomatous colon polyps. Last colonoscopy July 2013 without neoplasia   PLAN:  #1. Reflux precautions #2. Prescribe omeprazole 40 mg daily. Electronically submitted today #3. Schedule upper endoscopy with esophageal dilation.The nature of the procedure, as well as the risks, benefits, and alternatives were carefully and thoroughly reviewed with the patient. Ample time for discussion and questions allowed. The patient understood, was satisfied, and agreed to proceed. #4. Surveillance colonoscopy around July 2018

## 2015-12-30 NOTE — Patient Instructions (Signed)

## 2015-12-31 ENCOUNTER — Ambulatory Visit (AMBULATORY_SURGERY_CENTER): Payer: Medicare Other | Admitting: Internal Medicine

## 2015-12-31 ENCOUNTER — Encounter: Payer: Self-pay | Admitting: Internal Medicine

## 2015-12-31 VITALS — BP 141/78 | HR 61 | Temp 98.2°F | Resp 15 | Ht 65.5 in | Wt 148.0 lb

## 2015-12-31 DIAGNOSIS — N400 Enlarged prostate without lower urinary tract symptoms: Secondary | ICD-10-CM | POA: Diagnosis not present

## 2015-12-31 DIAGNOSIS — K2100 Gastro-esophageal reflux disease with esophagitis, without bleeding: Secondary | ICD-10-CM

## 2015-12-31 DIAGNOSIS — R1310 Dysphagia, unspecified: Secondary | ICD-10-CM

## 2015-12-31 DIAGNOSIS — K222 Esophageal obstruction: Secondary | ICD-10-CM | POA: Diagnosis not present

## 2015-12-31 DIAGNOSIS — K2980 Duodenitis without bleeding: Secondary | ICD-10-CM | POA: Diagnosis not present

## 2015-12-31 DIAGNOSIS — K219 Gastro-esophageal reflux disease without esophagitis: Secondary | ICD-10-CM | POA: Diagnosis not present

## 2015-12-31 LAB — HELICOBACTER PYLORI SCREEN-BIOPSY: UREASE: NEGATIVE

## 2015-12-31 MED ORDER — SODIUM CHLORIDE 0.9 % IV SOLN: 500.0000 mL | INTRAVENOUS | Status: DC

## 2015-12-31 NOTE — Progress Notes (Signed)
  [facility] Post-op Note  Patient: Don Johnson  Procedure(s) Performed: endoscopy with dilatation  Patient Location: [facility] - Recovery Area  Anesthesia Type: Deep Sedation/Propofol  Level of Consciousness: awake, oriented and patient cooperative  Airway and Oxygen Therapy: Patient Spontanous Breathing  Post-op Pain: 0 /10  Post-op Assessment:  Post-op Vital signs reviewed, Patient's Cardiovascular Status Stable, Respiratory Function Stable, Patent Airway, No signs of Nausea or vomiting and Pain level controlled  Post-op Vital Signs: Reviewed and stable  Complications: No apparent anesthesia complications  Zarya Lasseigne E 11:22 AM

## 2015-12-31 NOTE — Op Note (Signed)
[facility] Patient Name: Don Johnson Procedure Date: 12/31/2015 10:55 AM MRN: [id number] Endoscopist: Docia Chuck. Henrene Pastor , MD Age: 75 Date of Birth: 11-21-1940 Gender: Male Procedure:                Upper GI endoscopy, with balloon dilation of the                            esophagus and biopsies Indications:              Dysphagia Medicines:                Monitored Anesthesia Care Procedure:                Pre-Anesthesia Assessment:                           - Prior to the procedure, a History and Physical                            was performed, and patient medications and                            allergies were reviewed. The patient's tolerance of                            previous anesthesia was also reviewed. The risks                            and benefits of the procedure and the sedation                            options and risks were discussed with the patient.                            All questions were answered, and informed consent                            was obtained. Prior Anticoagulants: The patient has                            taken no previous anticoagulant or antiplatelet                            agents. ASA Grade Assessment: II - A patient with                            mild systemic disease. After reviewing the risks                            and benefits, the patient was deemed in                            satisfactory condition to undergo the procedure.  After obtaining informed consent, the endoscope was                            passed under direct vision. Throughout the                            procedure, the patient's blood pressure, pulse, and                            oxygen saturations were monitored continuously. The                            Model GIF-[id number] scope was introduced                            through the mouth, and advanced to the second part                            of  duodenum. The upper GI endoscopy was                            accomplished without difficulty. The patient                            tolerated the procedure well. Scope In: Scope Out: Findings:                 One moderate benign-appearing, intrinsic stenosis                            was found in the distal esophagus measuring 15 mm                            in diameter. This was ring-like. A TTS dilator was                            passed through the scope. Dilation with an 18-19-20                            mm balloon dilator was performed to 20 mm. The                            dilation site was examined and showed moderate                            improvement in luminal narrowing, with trace heme.                            Estimated blood loss: none.                           The entire examined stomach was normal.  A 3 cm hiatal hernia was present.                           Multiple erosions were found in the duodenal bulb.                            Biopsies were taken with a cold forceps for                            Helicobacter pylori testing using CLOtest. Complications:            No immediate complications. Estimated Blood Loss:     Estimated blood loss: none. Impression:               - Benign-appearing esophageal stenosis. Dilated.                           - Normal stomach.                           - 3 cm hiatal hernia.                           - Duodenal erosions. Biopsied. Recommendation:           - Patient has a contact number available for                            emergencies. The signs and symptoms of potential                            delayed complications were discussed with the                            patient. Return to normal activities tomorrow.                            Written discharge instructions were provided to the                            patient.                           - Resume previous diet.                            - Continue present medications.                           - Await pathology results.                           - Return to my office in 8 weeks. Docia Chuck. Henrene Pastor, MD 12/31/2015 11:24:46 AM This report has been signed electronically. CC Letter to:             Janith Lima, MD

## 2015-12-31 NOTE — Progress Notes (Signed)
Called to room to assist during endoscopic procedure.  Patient ID and intended procedure confirmed with present staff. Received instructions for my participation in the procedure from the performing physician.  

## 2015-12-31 NOTE — Patient Instructions (Signed)
YOU HAD AN ENDOSCOPIC PROCEDURE TODAY AT [facility]:   Refer to the procedure report that was given to you for any specific questions about what was found during the examination.  If the procedure report does not answer your questions, please call your gastroenterologist to clarify.  If you requested that your care partner not be given the details of your procedure findings, then the procedure report has been included in a sealed envelope for you to review at your convenience later.  YOU SHOULD EXPECT: Some feelings of bloating in the abdomen. Passage of more gas than usual.  Walking can help get rid of the air that was put into your GI tract during the procedure and reduce the bloating. If you had a lower endoscopy (such as a colonoscopy or flexible sigmoidoscopy) you may notice spotting of blood in your stool or on the toilet paper. If you underwent a bowel prep for your procedure, you may not have a normal bowel movement for a few days.  Please Note:  You might notice some irritation and congestion in your nose or some drainage.  This is from the oxygen used during your procedure.  There is no need for concern and it should clear up in a day or so.  SYMPTOMS TO REPORT IMMEDIATELY:   Following upper endoscopy (EGD)  Vomiting of blood or coffee ground material  New chest pain or pain under the shoulder blades  Painful or persistently difficult swallowing  New shortness of breath  Fever of 100F or higher  Black, tarry-looking stools  For urgent or emergent issues, a gastroenterologist can be reached at any hour by calling [phone].   DIET: Your first meal following the procedure should be a small meal and then it is ok to progress to your normal diet. Heavy or fried foods are harder to digest and may make you feel nauseous or bloated.  Likewise, meals heavy in dairy and vegetables can increase bloating.  Drink plenty of fluids but you should avoid alcoholic beverages for  24 hours.  ACTIVITY:  You should plan to take it easy for the rest of today and you should NOT DRIVE or use heavy machinery until tomorrow (because of the sedation medicines used during the test).    FOLLOW UP: Our staff will call the number listed on your records the next business day following your procedure to check on you and address any questions or concerns that you may have regarding the information given to you following your procedure. If we do not reach you, we will leave a message.  However, if you are feeling well and you are not experiencing any problems, there is no need to return our call.  We will assume that you have returned to your regular daily activities without incident.  If any biopsies were taken you will be contacted by phone or by letter within the next 1-3 weeks.  Please call us at [phone] if you have not heard about the biopsies in 3 weeks.    SIGNATURES/CONFIDENTIALITY: You and/or your care partner have signed paperwork which will be entered into your electronic medical record.  These signatures attest to the fact that that the information above on your After Visit Summary has been reviewed and is understood.  Full responsibility of the confidentiality of this discharge information lies with you and/or your care-partner.  Dilation diet instructions given; handout provided. Await biopsy results. Stricture and hiatal hernia handouts provided. Office appointment in 8 weeks.

## 2016-01-03 ENCOUNTER — Telehealth: Payer: Self-pay | Admitting: *Deleted

## 2016-01-03 NOTE — Telephone Encounter (Signed)
  Follow up Call-  Call back number 12/31/2015 07/16/2013  Post procedure Call Back phone  # [phone]  Permission to leave phone message Yes Yes     Patient questions:  Do you have a fever, pain , or abdominal swelling? No. Pain Score  0 *  Have you tolerated food without any problems? Yes.    Have you been able to return to your normal activities? yes  Do you have any questions about your discharge instructions: Diet   No. Medications  No. Follow up visit  No.  Do you have questions or concerns about your Care? No.  Actions: * If pain score is 4 or above: No action needed, pain <4.

## 2016-03-06 ENCOUNTER — Ambulatory Visit (INDEPENDENT_AMBULATORY_CARE_PROVIDER_SITE_OTHER): Payer: Medicare Other | Admitting: Internal Medicine

## 2016-03-06 ENCOUNTER — Encounter: Payer: Self-pay | Admitting: Internal Medicine

## 2016-03-06 VITALS — BP 155/70 | HR 62 | Ht 65.5 in | Wt 145.0 lb

## 2016-03-06 DIAGNOSIS — R1310 Dysphagia, unspecified: Secondary | ICD-10-CM | POA: Diagnosis not present

## 2016-03-06 DIAGNOSIS — K219 Gastro-esophageal reflux disease without esophagitis: Secondary | ICD-10-CM | POA: Diagnosis not present

## 2016-03-06 DIAGNOSIS — K222 Esophageal obstruction: Secondary | ICD-10-CM | POA: Diagnosis not present

## 2016-03-06 NOTE — Progress Notes (Signed)
HISTORY OF PRESENT ILLNESS:  Don Johnson is a 75 y.o. male , previous patient Dr. Verl Blalock, who has a history of GERD, peptic stricture, and adenomatous colon polyps. The patient established with me 12/30/2015 with worsening intermittent solid food dysphagia. Upper endoscopy was performed the following day. He was found to have a peptic stricture and hiatal hernia. He was dilated to a maximal diameter of 20 mm. He was continued on PPI and follows up at this time. Patient is pleased report no further problems with dysphagia. No heartburn or indigestion. No new GI complaints.  REVIEW OF SYSTEMS:  All non-GI ROS negative upon review  Past Medical History  Diagnosis Date  . HYPERLIPIDEMIA 09/28/2008  . ESOPHAGEAL STRICTURE 12/14/2008  . HIATAL HERNIA WITH REFLUX 12/14/2008  . DIVERTICULOSIS, COLON 12/14/2008  . HYPERTROPHY PROSTATE W/UR OBST & OTH LUTS 12/03/2008  . DEGENERATIVE JOINT DISEASE, HANDS 12/03/2008  . COLONIC POLYPS, ADENOMATOUS, HX OF 12/14/2008  . Pneumonia   . GERD (gastroesophageal reflux disease)   . Bilateral inguinal hernia   . Cataract     Past Surgical History  Procedure Laterality Date  . Hemorroidectomy    . Inguinal hernia repair Bilateral   . Umbilical hernia repair    . Colonoscopy    . Polypectomy      Social History Don Johnson  reports that he has never smoked. He has never used smokeless tobacco. He reports that he does not drink alcohol or use illicit drugs.  family history includes Arthritis in his mother; Colon polyps in his mother; Diabetes in his mother; Hyperlipidemia in his mother; Hypertension in his mother.  No Known Allergies     PHYSICAL EXAMINATION: Vital signs: BP 155/70 mmHg  Pulse 62  Ht 5' 5.5" (1.664 m)  Wt 145 lb (65.772 kg)  BMI 23.75 kg/m2  Constitutional: generally well-appearing, no acute distress Psychiatric: alert and oriented x3, cooperative Eyes: extraocular movements intact, anicteric, conjunctiva pink Mouth:  oral pharynx moist, no lesions Neck: supple no lymphadenopathy Cardiovascular: heart regular rate and rhythm, no murmur Lungs: clear to auscultation bilaterally Abdomen: soft, nontender, nondistended, no obvious ascites, no peritoneal signs, normal bowel sounds, no organomegaly Rectal:Omitted Extremities: no clubbing, cyanosis, or lower extremity edema bilaterally Skin: no lesions on visible extremities Neuro: No focal deficits.  ASSESSMENT:  #1. GERD, complicated by peptic stricture. Asymptomatic post dilation on PPI #2. History of adenomatous colon polyps. Last examination 2013. Due for follow-up next year   PLAN:   #1. Reflux precautions #2. Continue omeprazole 40 mg daily #3. Surveillance colonoscopy 1 year. Interval follow-up as needed  15 minutes was spent face-to-face with the patient. Greater than 50% a time use for counseling regarding his complicated GERD

## 2016-03-06 NOTE — Patient Instructions (Signed)
Please follow up in one year 

## 2016-04-05 DIAGNOSIS — M7542 Impingement syndrome of left shoulder: Secondary | ICD-10-CM | POA: Diagnosis not present

## 2016-06-12 ENCOUNTER — Ambulatory Visit (INDEPENDENT_AMBULATORY_CARE_PROVIDER_SITE_OTHER): Payer: Medicare Other

## 2016-06-12 DIAGNOSIS — N5201 Erectile dysfunction due to arterial insufficiency: Secondary | ICD-10-CM | POA: Diagnosis not present

## 2016-06-12 DIAGNOSIS — Z23 Encounter for immunization: Secondary | ICD-10-CM

## 2016-06-12 DIAGNOSIS — F524 Premature ejaculation: Secondary | ICD-10-CM | POA: Diagnosis not present

## 2016-07-31 ENCOUNTER — Other Ambulatory Visit (INDEPENDENT_AMBULATORY_CARE_PROVIDER_SITE_OTHER): Payer: Medicare Other

## 2016-07-31 ENCOUNTER — Encounter: Payer: Self-pay | Admitting: Internal Medicine

## 2016-07-31 ENCOUNTER — Ambulatory Visit (INDEPENDENT_AMBULATORY_CARE_PROVIDER_SITE_OTHER): Payer: Medicare Other | Admitting: Internal Medicine

## 2016-07-31 VITALS — BP 134/74 | HR 53 | Temp 98.6°F | Resp 16 | Ht 65.5 in | Wt 143.8 lb

## 2016-07-31 DIAGNOSIS — R61 Generalized hyperhidrosis: Secondary | ICD-10-CM | POA: Diagnosis not present

## 2016-07-31 DIAGNOSIS — Z0000 Encounter for general adult medical examination without abnormal findings: Secondary | ICD-10-CM

## 2016-07-31 DIAGNOSIS — M5030 Other cervical disc degeneration, unspecified cervical region: Secondary | ICD-10-CM

## 2016-07-31 DIAGNOSIS — E785 Hyperlipidemia, unspecified: Secondary | ICD-10-CM

## 2016-07-31 DIAGNOSIS — R739 Hyperglycemia, unspecified: Secondary | ICD-10-CM | POA: Diagnosis not present

## 2016-07-31 LAB — CBC WITH DIFFERENTIAL/PLATELET
BASOS ABS: 0.1 10*3/uL (ref 0.0–0.1)
Basophils Relative: 0.9 % (ref 0.0–3.0)
Eosinophils Absolute: 0.2 10*3/uL (ref 0.0–0.7)
Eosinophils Relative: 2.8 % (ref 0.0–5.0)
HCT: 45.7 % (ref 39.0–52.0)
Hemoglobin: 15.5 g/dL (ref 13.0–17.0)
LYMPHS ABS: 1.7 10*3/uL (ref 0.7–4.0)
Lymphocytes Relative: 26.1 % (ref 12.0–46.0)
MCHC: 33.9 g/dL (ref 30.0–36.0)
MCV: 90 fl (ref 78.0–100.0)
MONO ABS: 0.7 10*3/uL (ref 0.1–1.0)
MONOS PCT: 10.9 % (ref 3.0–12.0)
NEUTROS ABS: 4 10*3/uL (ref 1.4–7.7)
NEUTROS PCT: 59.3 % (ref 43.0–77.0)
PLATELETS: 251 10*3/uL (ref 150.0–400.0)
RBC: 5.08 Mil/uL (ref 4.22–5.81)
RDW: 13.3 % (ref 11.5–15.5)
WBC: 6.7 10*3/uL (ref 4.0–10.5)

## 2016-07-31 LAB — LIPID PANEL
CHOL/HDL RATIO: 4
Cholesterol: 240 mg/dL — ABNORMAL HIGH (ref 0–200)
HDL: 60.6 mg/dL (ref >39.00)
LDL CALC: 150 mg/dL — AB (ref 0–99)
NONHDL: 179.31
TRIGLYCERIDES: 145 mg/dL (ref 0.0–149.0)
VLDL: 29 mg/dL (ref 0.0–40.0)

## 2016-07-31 LAB — COMPREHENSIVE METABOLIC PANEL
ALBUMIN: 4.5 g/dL (ref 3.5–5.2)
ALT: 19 U/L (ref 0–53)
AST: 23 U/L (ref 0–37)
Alkaline Phosphatase: 67 U/L (ref 39–117)
BUN: 17 mg/dL (ref 6–23)
CHLORIDE: 104 meq/L (ref 96–112)
CO2: 31 meq/L (ref 19–32)
CREATININE: 0.99 mg/dL (ref 0.40–1.50)
Calcium: 9.4 mg/dL (ref 8.4–10.5)
GFR: 78.19 mL/min (ref >60.00)
Glucose, Bld: 97 mg/dL (ref 70–99)
Potassium: 4.3 mEq/L (ref 3.5–5.1)
SODIUM: 142 meq/L (ref 135–145)
Total Bilirubin: 0.5 mg/dL (ref 0.2–1.2)
Total Protein: 7.2 g/dL (ref 6.0–8.3)

## 2016-07-31 LAB — SEDIMENTATION RATE: Sed Rate: 6 mm/hr (ref 0–20)

## 2016-07-31 LAB — TSH: TSH: 2.5 u[IU]/mL (ref 0.35–4.50)

## 2016-07-31 LAB — HEMOGLOBIN A1C: Hgb A1c MFr Bld: 6.2 % (ref 4.6–6.5)

## 2016-07-31 NOTE — Progress Notes (Signed)
Pre visit review using our clinic review tool, if applicable. No additional management support is needed unless otherwise documented below in the visit note. 

## 2016-07-31 NOTE — Patient Instructions (Signed)

## 2016-07-31 NOTE — Progress Notes (Signed)
Subjective:  Patient ID: Don Johnson, male    DOB: Jun 11, 1941  Age: 75 y.o. MRN: [id number]  CC: Neck Pain; Hyperlipidemia; and Annual Exam   HPI Don Johnson presents for an AWV/CPX.  He has several chronic complaints but no new or worsening symptoms.  Past Medical History:  Diagnosis Date  . Bilateral inguinal hernia   . Cataract   . COLONIC POLYPS, ADENOMATOUS, HX OF 12/14/2008  . DEGENERATIVE JOINT DISEASE, HANDS 12/03/2008  . DIVERTICULOSIS, COLON 12/14/2008  . ESOPHAGEAL STRICTURE 12/14/2008  . GERD (gastroesophageal reflux disease)   . HIATAL HERNIA WITH REFLUX 12/14/2008  . HYPERLIPIDEMIA 09/28/2008  . HYPERTROPHY PROSTATE W/UR OBST & OTH LUTS 12/03/2008  . Pneumonia    Past Surgical History:  Procedure Laterality Date  . COLONOSCOPY    . HEMORROIDECTOMY    . INGUINAL HERNIA REPAIR Bilateral   . POLYPECTOMY    . UMBILICAL HERNIA REPAIR      reports that he has never smoked. He has never used smokeless tobacco. He reports that he does not drink alcohol or use drugs. family history includes Arthritis in his mother; Colon polyps in his mother; Diabetes in his mother; Hyperlipidemia in his mother; Hypertension in his mother. No Known Allergies  Outpatient Medications Prior to Visit  Medication Sig Dispense Refill  . aspirin (BAYER LOW STRENGTH) 81 MG EC tablet 2 tablets by mouth once daily     . omeprazole (PRILOSEC) 40 MG capsule Take 1 capsule (40 mg total) by mouth daily. 30 capsule 11  . Psyllium (METAMUCIL PO) Take 1 scoop by mouth 2 (two) times daily.    . Multiple Vitamins-Minerals (CENTRUM PO) Take 1 tablet by mouth daily.       No facility-administered medications prior to visit.     ROS Review of Systems  Constitutional: Positive for diaphoresis. Negative for activity change, appetite change, chills, fatigue, fever and unexpected weight change.       He complains of intermittent night sweats for about a year  HENT: Negative.  Negative for trouble  swallowing and voice change.   Respiratory: Negative.  Negative for cough, choking, chest tightness, shortness of breath and stridor.   Cardiovascular: Negative for chest pain, palpitations and leg swelling.  Gastrointestinal: Negative.  Negative for abdominal pain, constipation, diarrhea, nausea and vomiting.  Endocrine: Negative.   Genitourinary: Negative.  Negative for difficulty urinating, flank pain, penile swelling, scrotal swelling, testicular pain and urgency.  Musculoskeletal: Positive for arthralgias and neck pain. Negative for back pain, gait problem, myalgias and neck stiffness.       He complains of intermittent left-sided neck pain. The pain does not radiate into his extremities and he denies numbness/weakness/tingling. He takes over-the-counter medications for symptom relief. He also has bilateral shoulder pain and has seen an orthopedist and his had the shoulders injected with steroids.  Skin: Negative.  Negative for color change, pallor and rash.  Allergic/Immunologic: Negative.   Neurological: Negative.  Negative for dizziness, weakness, light-headedness, numbness and headaches.  Hematological: Negative.  Negative for adenopathy. Does not bruise/bleed easily.  Psychiatric/Behavioral: Negative.     Objective:  BP 134/74 (BP Location: Left Arm, Patient Position: Sitting, Cuff Size: Normal)   Pulse (!) 53   Temp 98.6 F (37 C) (Oral)   Resp 16   Ht 5' 5.5" (1.664 m)   Wt 143 lb 12 oz (65.2 kg)   SpO2 96%   BMI 23.56 kg/m   BP Readings from Last 3 Encounters:  07/31/16  134/74  03/06/16 (!) 155/70  12/31/15 (!) 141/78    Wt Readings from Last 3 Encounters:  07/31/16 143 lb 12 oz (65.2 kg)  03/06/16 145 lb (65.8 kg)  12/31/15 148 lb (67.1 kg)    Physical Exam  Constitutional: He is oriented to person, place, and time. He appears well-developed and well-nourished. No distress.  HENT:  Head: Normocephalic and atraumatic.  Mouth/Throat: Oropharynx is clear and  moist. No oropharyngeal exudate.  Eyes: Conjunctivae are normal. Right eye exhibits no discharge. Left eye exhibits no discharge. No scleral icterus.  Neck: Normal range of motion. Neck supple. No JVD present. No tracheal deviation present. No thyromegaly present.  Cardiovascular: Normal rate, regular rhythm, normal heart sounds and intact distal pulses.  Exam reveals no gallop and no friction rub.   No murmur heard. Pulmonary/Chest: Effort normal and breath sounds normal. No stridor. No respiratory distress. He has no wheezes. He has no rales. He exhibits no tenderness.  Abdominal: Soft. Bowel sounds are normal. He exhibits no distension and no mass. There is no tenderness. There is no rebound and no guarding.  Musculoskeletal: Normal range of motion. He exhibits no edema, tenderness or deformity.       Cervical back: Normal. He exhibits normal range of motion, no tenderness, no bony tenderness, no swelling, no edema, no deformity, no pain and no spasm.  Lymphadenopathy:    He has no cervical adenopathy.  Neurological: He is alert and oriented to person, place, and time. He has normal reflexes. He displays normal reflexes. No cranial nerve deficit. He exhibits normal muscle tone. Coordination normal.  Skin: Skin is warm and dry. No rash noted. He is not diaphoretic. No erythema.  Psychiatric: He has a normal mood and affect. His behavior is normal. Judgment and thought content normal.  Vitals reviewed.   Lab Results  Component Value Date   WBC 6.7 07/31/2016   HGB 15.5 07/31/2016   HCT 45.7 07/31/2016   PLT 251.0 07/31/2016   GLUCOSE 97 07/31/2016   CHOL 240 (H) 07/31/2016   TRIG 145.0 07/31/2016   HDL 60.60 07/31/2016   LDLDIRECT 149.1 06/30/2013   LDLCALC 150 (H) 07/31/2016   ALT 19 07/31/2016   AST 23 07/31/2016   NA 142 07/31/2016   K 4.3 07/31/2016   CL 104 07/31/2016   CREATININE 0.99 07/31/2016   BUN 17 07/31/2016   CO2 31 07/31/2016   TSH 2.50 07/31/2016   PSA 0.48  07/29/2015   HGBA1C 6.2 07/31/2016    Dg Cervical Spine Complete  Result Date: 07/29/2015 CLINICAL DATA:  Chronic right-sided neck pain.  No injury. EXAM: CERVICAL SPINE - COMPLETE 4+ VIEW COMPARISON:  None. FINDINGS: Vertebral body heights are within normal. There is mild spondylosis throughout the cervical spine worse over the mid to lower spine. Minimal 2 mm anterior subluxation of C7 on T1 likely degenerative due to the moderate facet arthropathy. There is moderate disc space narrowing at the C5-6 and C6-7 levels. Prevertebral soft tissues are within normal. There is mild right-sided neural foraminal narrowing at the C3-4 and C7-T1 levels. Left neural foramina are patent. There is moderate uncovertebral joint spurring the atlantoaxial articulation is within normal. IMPRESSION: Mild to moderate spondylosis of the cervical spine with moderate disc disease at the C5-6 and C6-7 levels. Mild right-sided neural foraminal narrowing at the C3-4 and C7-T1 levels. Subtle degenerative 2 mm anterior subluxation of C7 on T1. Electronically Signed   By: Marin Olp M.D.   On: 07/29/2015 09:39  Assessment & Plan:   Jaad was seen today for neck pain, hyperlipidemia and annual exam.  Diagnoses and all orders for this visit:  Hyperlipidemia with target LDL less than 130- He has an elevated Framingham risk score so I've asked him to start taking a statin for cardiovascular risk reduction. -     Lipid panel; Future -     atorvastatin (LIPITOR) 40 MG tablet; Take 1 tablet (40 mg total) by mouth daily.  Hyperglycemia- his A1c is slightly elevated at 6.2%, he has prediabetes. No medications are needed at this time. He will continue to work on his lifestyle modifications. -     Comprehensive metabolic panel; Future -     Hemoglobin A1c; Future  DDD (degenerative disc disease), cervical- he does not want a medication or to consider physical therapy or surgical intervention to treat this.  Chronic night  sweats- he has no localizing symptoms with relation to this and his lab work is negative for any concern for infection or lymphoproliferative disease.  -     CBC with Differential/Platelet; Future -     Comprehensive metabolic panel; Future -     Sedimentation rate; Future -     TSH; Future -     RPR; Future   I have discontinued Mr. Sprenger Multiple Vitamins-Minerals (CENTRUM PO). I am also having him start on atorvastatin. Additionally, I am having him maintain his aspirin, Psyllium (METAMUCIL PO), omeprazole, and sertraline.  Meds ordered this encounter  Medications  . sertraline (ZOLOFT) 100 MG tablet  . atorvastatin (LIPITOR) 40 MG tablet    Sig: Take 1 tablet (40 mg total) by mouth daily.    Dispense:  90 tablet    Refill:  3   See AVS for instructions about healthy living and anticipatory guidance.  Follow-up: Return in about 2 months (around 09/30/2016).  Scarlette Calico, MD

## 2016-08-01 ENCOUNTER — Encounter: Payer: Self-pay | Admitting: Internal Medicine

## 2016-08-01 LAB — RPR

## 2016-08-01 MED ORDER — ATORVASTATIN CALCIUM 40 MG PO TABS: 40.0000 mg | ORAL_TABLET | Freq: Every day | ORAL | 3 refills | Status: DC

## 2016-08-01 NOTE — Assessment & Plan Note (Signed)

## 2016-09-01 ENCOUNTER — Other Ambulatory Visit: Payer: Self-pay | Admitting: Urology

## 2016-09-20 ENCOUNTER — Ambulatory Visit (INDEPENDENT_AMBULATORY_CARE_PROVIDER_SITE_OTHER): Payer: Medicare Other

## 2016-09-20 ENCOUNTER — Encounter (HOSPITAL_COMMUNITY): Payer: Self-pay | Admitting: Emergency Medicine

## 2016-09-20 ENCOUNTER — Ambulatory Visit (HOSPITAL_COMMUNITY)
Admission: EM | Admit: 2016-09-20 | Discharge: 2016-09-20 | Disposition: A | Discharge status: Home / Self Care | Payer: Medicare Other | Attending: Family Medicine | Admitting: Family Medicine

## 2016-09-20 DIAGNOSIS — J069 Acute upper respiratory infection, unspecified: Secondary | ICD-10-CM

## 2016-09-20 DIAGNOSIS — B9789 Other viral agents as the cause of diseases classified elsewhere: Secondary | ICD-10-CM | POA: Diagnosis not present

## 2016-09-20 DIAGNOSIS — R059 Cough, unspecified: Secondary | ICD-10-CM

## 2016-09-20 DIAGNOSIS — R05 Cough: Secondary | ICD-10-CM | POA: Diagnosis not present

## 2016-09-20 DIAGNOSIS — H2513 Age-related nuclear cataract, bilateral: Secondary | ICD-10-CM | POA: Diagnosis not present

## 2016-09-20 DIAGNOSIS — H40002 Preglaucoma, unspecified, left eye: Secondary | ICD-10-CM | POA: Diagnosis not present

## 2016-09-20 HISTORY — DX: Cough, unspecified: R05.9

## 2016-09-20 MED ORDER — GUAIFENESIN-CODEINE 100-10 MG/5ML PO SYRP: 10.0000 mL | ORAL_SOLUTION | Freq: Four times a day (QID) | ORAL | 1 refills | Status: DC | PRN

## 2016-09-20 MED ORDER — IPRATROPIUM BROMIDE 0.06 % NA SOLN: 2.0000 | Freq: Four times a day (QID) | NASAL | 1 refills | Status: DC

## 2016-09-20 NOTE — ED Triage Notes (Signed)
The patient presented to the [facility] with a complaint of a cough and congestion that started 2 days ago.

## 2016-09-20 NOTE — ED Provider Notes (Signed)
[facility]    CSN: [id number] Arrival date & time: 09/20/16  1333     History   Chief Complaint Chief Complaint  Patient presents with  . Cough    HPI Don Johnson is a 76 y.o. male.   The history is provided by the patient.  Cough  Cough characteristics:  Non-productive Severity:  Moderate Onset quality:  Gradual Duration:  4 days Progression:  Unchanged Chronicity:  New Smoker: no   Context: upper respiratory infection and weather changes   Relieved by:  None tried Ineffective treatments:  None tried Associated symptoms: rhinorrhea   Associated symptoms: no chest pain, no fever and no wheezing     Past Medical History:  Diagnosis Date  . Bilateral recurrent inguinal hernia   . BPH with obstruction/lower urinary tract symptoms   . Cough 09/20/2016  . DDD (degenerative disc disease), cervical   . Diverticulosis of colon   . GERD (gastroesophageal reflux disease)   . Hiatal hernia   . History of adenomatous polyp of colon    2006 tubular adenoma;  2010 tubular adenoma and hyperplastic  . Hyperlipidemia   . OA (osteoarthritis)    hands  . Organic sexual dysfunction   . Pre-diabetes   . S/P dilatation of esophageal stricture    multiple times --  last time w/ balloon 12-31-2015  . Wears dentures    upper denture, lower partial  . Wears glasses     Patient Active Problem List   Diagnosis Date Noted  . Chronic night sweats 07/31/2016  . DDD (degenerative disc disease), cervical 07/29/2015  . Hyperglycemia 06/30/2013  . Erectile dysfunction 06/30/2013  . Unspecified constipation 05/19/2013  . Routine general medical examination at a health care facility 04/12/2011  . ESOPHAGEAL STRICTURE 12/14/2008  . Gastritis and gastroduodenitis 12/14/2008  . BPH (benign prostatic hypertrophy) with urinary obstruction 12/03/2008  . Osteoarthrosis, hand 12/03/2008  . Hyperlipidemia with target LDL less than 130 09/28/2008    Past Surgical History:    Procedure Laterality Date  . COLONOSCOPY  last one 03-14-2011  . ESOPHAGOGASTRODUODENOSCOPY (EGD) WITH ESOPHAGEAL DILATION  last one 12-31-2015  . HEMORROIDECTOMY  1970's  . INGUINAL HERNIA REPAIR Bilateral 1980's  . PENILE PROSTHESIS IMPLANT N/A 09/29/2016   Procedure: PENILE PROTHESIS INFLATABLE;  Surgeon: Carolan Clines, MD;  Location: [facility];  Service: Urology;  Laterality: N/A;  . UMBILICAL HERNIA REPAIR  2007 approx       Home Medications    Prior to Admission medications   Medication Sig Start Date End Date Taking? Authorizing Provider  atorvastatin (LIPITOR) 40 MG tablet Take 1 tablet (40 mg total) by mouth daily. Patient taking differently: Take 40 mg by mouth every morning.  08/01/16  Yes Janith Lima, MD  omeprazole (PRILOSEC) 40 MG capsule Take 1 capsule (40 mg total) by mouth daily. Patient taking differently: Take 40 mg by mouth every morning.  12/30/15  Yes Irene Shipper, MD  Psyllium (METAMUCIL PO) Take 1 scoop by mouth 2 (two) times daily.   Yes Historical Provider, MD  amoxicillin-clavulanate (AUGMENTIN) 875-125 MG tablet Take 1 tablet by mouth 2 (two) times daily. 09/29/16 10/13/16  Lolita Rieger, MD  diphenhydrAMINE (BENADRYL) 25 mg capsule Take 1 capsule (25 mg total) by mouth every 8 (eight) hours as needed for itching. 10/05/16   Flossie Buffy, NP  guaiFENesin-codeine (ROBITUSSIN AC) 100-10 MG/5ML syrup Take 10 mLs by mouth 4 (four) times daily as needed for cough. 09/20/16  Billy Fischer, MD  oxyCODONE-acetaminophen (ROXICET) 5-325 MG tablet Take 1-2 tablets by mouth every 4 (four) hours as needed for severe pain. 09/29/16   Lolita Rieger, MD  triamcinolone ointment (KENALOG) 0.1 % Apply 1 application topically 2 (two) times daily. 10/05/16   Flossie Buffy, NP    Family History Family History  Problem Relation Age of Onset  . Diabetes Mother   . Colon polyps Mother   . Arthritis Mother   . Hypertension Mother   . Hyperlipidemia  Mother     Social History Social History  Substance Use Topics  . Smoking status: Never Smoker  . Smokeless tobacco: Never Used  . Alcohol use No     Allergies   Patient has no known allergies.   Review of Systems Review of Systems  Constitutional: Negative.  Negative for fever.  HENT: Positive for congestion, postnasal drip and rhinorrhea.   Respiratory: Positive for cough. Negative for wheezing.   Cardiovascular: Negative.  Negative for chest pain.     Physical Exam Triage Vital Signs ED Triage Vitals  Enc Vitals Group     BP --      Pulse Rate 09/20/16 1436 78     Resp 09/20/16 1436 18     Temp 09/20/16 1436 98.9 F (37.2 C)     Temp Source 09/20/16 1436 Oral     SpO2 09/20/16 1436 99 %     Weight --      Height --      Head Circumference --      Peak Flow --      Pain Score 09/20/16 1439 0     Pain Loc --      Pain Edu? --      Excl. in [place]? --    No data found.   Updated Vital Signs Pulse 78   Temp 98.9 F (37.2 C) (Oral)   Resp 18   SpO2 99%   Visual Acuity Right Eye Distance:   Left Eye Distance:   Bilateral Distance:    Right Eye Near:   Left Eye Near:    Bilateral Near:     Physical Exam  Constitutional: He appears well-developed and well-nourished. No distress.  HENT:  Right Ear: External ear normal.  Left Ear: External ear normal.  Nose: Nose normal.  Mouth/Throat: Oropharynx is clear and moist.  Eyes: Conjunctivae are normal. Pupils are equal, round, and reactive to light.  Neck: Normal range of motion. Neck supple.  Cardiovascular: Normal rate, regular rhythm and normal heart sounds.   Pulmonary/Chest: Effort normal and breath sounds normal.  Lymphadenopathy:    He has no cervical adenopathy.  Skin: Skin is warm and dry.  Nursing note and vitals reviewed.    UC Treatments / Results  Labs (all labs ordered are listed, but only abnormal results are displayed) Labs Reviewed - No data to display  EKG  EKG  Interpretation None       Radiology No results found. X-rays reviewed and report per radiologist.  Procedures Procedures (including critical care time)  Medications Ordered in UC Medications - No data to display   Initial Impression / Assessment and Plan / UC Course  I have reviewed the triage vital signs and the nursing notes.  Pertinent labs & imaging results that were available during my care of the patient were reviewed by me and considered in my medical decision making (see chart for details).       Final Clinical Impressions(s) /  UC Diagnoses   Final diagnoses:  Viral URI with cough    New Prescriptions Discharge Medication List as of 09/20/2016  4:33 PM    START taking these medications   Details  guaiFENesin-codeine (ROBITUSSIN AC) 100-10 MG/5ML syrup Take 10 mLs by mouth 4 (four) times daily as needed for cough., Starting Wed 09/20/2016, Print    ipratropium (ATROVENT) 0.06 % nasal spray Place 2 sprays into both nostrils 4 (four) times daily., Starting Wed 09/20/2016, Print         Billy Fischer, MD 10/10/16 [id number]

## 2016-09-27 ENCOUNTER — Encounter (HOSPITAL_BASED_OUTPATIENT_CLINIC_OR_DEPARTMENT_OTHER): Payer: Self-pay | Admitting: *Deleted

## 2016-09-27 NOTE — Progress Notes (Addendum)
NPO AFTER MN.  ARRIVE AT 0715.  NEEDS ISTAT 8 AND EKG.  WILL TAKE PRILOSEC AM DOS W/ SIPS OF WATER.

## 2016-09-28 NOTE — H&P (Signed)
Office Visit Report     06/12/2016   --------------------------------------------------------------------------------   Don Johnson  MRN: [id number]  PRIMARY CARE:  Janith Lima, MD  DOB: Feb 18, 1941, 76 year old Male  REFERRING:    SSN: [id number]  PROVIDER:  Carolan Clines, M.D.    LOCATION:  [facility] [phone]   --------------------------------------------------------------------------------   CC: I am having trouble with my erections.  HPI: Don Johnson is a 76 year-old male patient who is here for erectile dysfunction.  He first stated noticing pain on approximately 06/11/2001. His symptoms did begin gradually. His symptoms have been stable over the last year.   He does have difficulties achieving an erection. He does have problems maintaining his erections. His erections are not straight. He has tried Viagra. It did not work. He has tried Cialis. It did not work. He has tried penile injection therapy. He used trimix. He has tried a vacuum erection device. It did work.   patient notes ringing from vacuum erection device helps keep blood and penis, but erection is not firm. In addition, the patient notes premature ejaculation.     CC: I have prematue ejaculation.  HPI: He has had this symptom for 20 years. His symptoms did not begin gradually. It takes him 1 minutes to ejaculate. Sex is not satisfactory.   He has tried desensitizing lotions to treat his premature ejaculation. The treatment has not been successful in prolonging his ejaculations.   He does have problems with erections. He does not have normal ejaculate volume. He does have anxiety because of the symptoms.     IIEF-5 Score: The patient's confidence that he can get an erection is low. The patient's erections were hard enough for penetration a few times. The patient was able to maintain his erection after he had penetrated his partner almost never or never. During sexual intercouse, it was  very difficult to maintain his erection to the completion of intercourse. The patient found sexual intercourse satisfactory almost always or always.   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  Has never drank.  Drinks 3 caffeinated drinks per day. Patient's occupation is/was Retired.    REVIEW OF SYSTEMS:    GU Review Male:   Patient reports get up at night to urinate and erection problems. Patient denies trouble starting your stream, frequent urination, stream starts and stops, have to strain to urinate , penile pain, leakage of urine, burning/ pain with urination, and hard to postpone urination.  Gastrointestinal (Upper):   Patient denies nausea, vomiting, and indigestion/ heartburn.  Gastrointestinal (Lower):   Patient denies diarrhea and constipation.  Constitutional:   Patient denies fever, night sweats, weight loss, and fatigue.  Skin:   Patient denies skin rash/ lesion and itching.  Eyes:  Patient denies blurred vision and double vision.  Ears/ Nose/ Throat:   Patient denies sore throat and sinus problems.  Hematologic/Lymphatic:   Patient denies swollen glands and easy bruising.  Cardiovascular:   Patient denies leg swelling and chest pains.  Respiratory:   Patient denies cough and shortness of breath.  Endocrine:   Patient denies  excessive thirst.  Musculoskeletal:   Patient reports joint pain. Patient denies back pain.  Neurological:   Patient denies headaches and dizziness.  Psychologic:   Patient denies depression and anxiety.   VITAL SIGNS:      06/12/2016 03:24 PM  Weight 148 lb / 67.13 kg  Height 66 in / 167.64 cm  BP 176/78 mmHg  Pulse 55 /min  BMI 23.9 kg/m   GU PHYSICAL EXAMINATION:    Anus and Perineum: No hemorrhoids. No anal stenosis. No rectal fissure, no anal fissure. No edema, no dimple, no perineal tenderness, no anal tenderness.  Scrotum: No lesions. No edema. No cysts. No warts.  Epididymides: Right: no spermatocele, no masses, no cysts, no tenderness, no induration, no enlargement. Left: no spermatocele, no masses, no cysts, no tenderness, no induration, no enlargement.  Testes: No tenderness, no swelling, no enlargement left testes. No tenderness, no swelling, no enlargement right testes. Normal location left testes. Normal location right testes. No mass, no cyst, no varicocele, no hydrocele left testes. No mass, no cyst, no varicocele, no hydrocele right testes.  Urethral Meatus: Normal size. No lesion, no wart, no discharge, no polyp. Normal location.  Penis: Circumcised, no warts, no cracks. No dorsal Peyronie's plaques, no left corporal Peyronie's plaques, no right corporal Peyronie's plaques, no scarring, no warts. No balanitis, no meatal stenosis.  Prostate: 40 gram or 2+ size. Left lobe normal consistency, right lobe normal consistency. Symmetrical lobes. No prostate nodule. Left lobe no tenderness, right lobe no tenderness.  Seminal Vesicles: Nonpalpable.  Sphincter Tone: Normal sphincter. No rectal tenderness. No rectal mass.    MULTI-SYSTEM PHYSICAL EXAMINATION:    Constitutional: Well-nourished. No physical deformities. Normally developed. Good grooming.  Neck: Neck symmetrical, not swollen. Normal tracheal position.  Respiratory: No labored breathing, no use of accessory muscles.    Cardiovascular: Normal temperature, normal extremity pulses, no swelling, no varicosities.  Lymphatic: No enlargement of neck, axillae, groin.  Skin: No paleness, no jaundice, no cyanosis. No lesion, no ulcer, no rash.  Neurologic / Psychiatric: Oriented to time, oriented to place, oriented to person. No depression, no anxiety, no agitation.  Gastrointestinal: No mass, no tenderness, no rigidity, non obese abdomen.  Eyes: Normal conjunctivae. Normal eyelids.  Ears, Nose, Mouth, and Throat: Left ear no scars, no lesions, no masses. Right ear no scars, no lesions, no masses. Nose no scars, no lesions, no masses. Normal hearing. Normal lips.  Musculoskeletal: Normal gait and station of head and neck.     PAST DATA REVIEWED:  Source Of History:  Patient  Records Review:   Previous Patient Records, IIEF Score   05/01/07  Hormones  Testosterone, Total 4.53     06/12/16  Urinalysis  Urine Appearance Clear   Urine Specimen Voided   Urine Color Yellow   Urine Glucose Neg   Urine Bilirubin Neg   Urine Ketones Neg   Urine Specific Gravity 1.020   Urine Blood Neg   Urine pH 5.0   Urine Protein Neg   Urine Urobilinogen 0.2   Urine Nitrites Neg   Urine Leukocyte Esterase Neg    PROCEDURES:  Urinalysis - 81003 Dipstick Dipstick Cont'd  Specimen: Voided Bilirubin: Neg  Color: Yellow Ketones: Neg  Appearance: Clear Blood: Neg  Specific Gravity: 1.020 Protein: Neg  pH: 5.0 Urobilinogen: 0.2  Glucose: Neg Nitrites: Neg    Leukocyte Esterase: Neg    ASSESSMENT:      ICD-10 Details  1 GU:   ED, arterial insufficiency - N52.01   2   Premature ejaculation - F52.4           Notes:   76 year old male with long-standing erectile dysfunction. He has low sexual health inventory. Using vacuum device, which does help with venous flow contracture, but he also has premature ejaculation of less than 1 minute. He will be given a prescription today for generic Zoloft (sertraline), 100 mg one  by mouth per day, may continue to use his vacuum device. I advised him with regard to surgical implantation of Coloplast Titan three-piece prosthesis, because he has failed injection therapy per Dr. Rosana Hoes, as well as both Viagra and Cialis in the past. He is also failing vacuum device with the ring. We discussed this with his wife. She has vaginal dryness, and we'll begin to take [place].   The patient is wife will use aspirin glide as well to help lubricate themselves. He will consider his surgical options, may call to schedule in October or November.    PLAN:            Medications New Meds: Sertraline Hcl 100 mg tablet 1 tablet PO Daily   #30  11 Refill(s)            Document Letter(s):  Created for Patient: Clinical Summary         Notes:   Pt will call to schedule IPP       The information contained in this medical record document is considered private and confidential patient information. This information can only be used for the medical diagnosis and/or medical services that are being provided by the patient's selected caregivers. This information can only be distributed outside of the patient's care if the patient agrees and signs waivers of authorization for this information to be sent to an outside source or route.

## 2016-09-29 ENCOUNTER — Ambulatory Visit (HOSPITAL_BASED_OUTPATIENT_CLINIC_OR_DEPARTMENT_OTHER): Payer: Medicare Other | Admitting: Anesthesiology

## 2016-09-29 ENCOUNTER — Encounter (HOSPITAL_BASED_OUTPATIENT_CLINIC_OR_DEPARTMENT_OTHER): Payer: Self-pay | Admitting: *Deleted

## 2016-09-29 ENCOUNTER — Encounter (HOSPITAL_BASED_OUTPATIENT_CLINIC_OR_DEPARTMENT_OTHER)
Admission: RE | Disposition: A | Discharge status: Home / Self Care | Payer: Self-pay | Source: Ambulatory Visit | Attending: Urology

## 2016-09-29 ENCOUNTER — Ambulatory Visit (HOSPITAL_BASED_OUTPATIENT_CLINIC_OR_DEPARTMENT_OTHER)
Admission: RE | Admit: 2016-09-29 | Discharge: 2016-09-29 | Disposition: A | Discharge status: Home / Self Care | Payer: Medicare Other | Source: Ambulatory Visit | Attending: Urology | Admitting: Urology

## 2016-09-29 DIAGNOSIS — N5201 Erectile dysfunction due to arterial insufficiency: Secondary | ICD-10-CM | POA: Insufficient documentation

## 2016-09-29 DIAGNOSIS — Z79899 Other long term (current) drug therapy: Secondary | ICD-10-CM | POA: Insufficient documentation

## 2016-09-29 DIAGNOSIS — K219 Gastro-esophageal reflux disease without esophagitis: Secondary | ICD-10-CM | POA: Insufficient documentation

## 2016-09-29 DIAGNOSIS — Z7982 Long term (current) use of aspirin: Secondary | ICD-10-CM | POA: Insufficient documentation

## 2016-09-29 DIAGNOSIS — F524 Premature ejaculation: Secondary | ICD-10-CM | POA: Insufficient documentation

## 2016-09-29 DIAGNOSIS — E785 Hyperlipidemia, unspecified: Secondary | ICD-10-CM | POA: Diagnosis not present

## 2016-09-29 DIAGNOSIS — N529 Male erectile dysfunction, unspecified: Secondary | ICD-10-CM | POA: Diagnosis not present

## 2016-09-29 HISTORY — DX: Presence of spectacles and contact lenses: Z97.3

## 2016-09-29 HISTORY — PX: PENILE PROSTHESIS IMPLANT: SHX240

## 2016-09-29 HISTORY — DX: Hyperlipidemia, unspecified: E78.5

## 2016-09-29 HISTORY — DX: Other obstructive and reflux uropathy: N40.1

## 2016-09-29 HISTORY — DX: Bilateral inguinal hernia, without obstruction or gangrene, recurrent: K40.21

## 2016-09-29 HISTORY — DX: Other cervical disc degeneration, unspecified cervical region: M50.30

## 2016-09-29 HISTORY — DX: Personal history of colonic polyps: Z86.010

## 2016-09-29 HISTORY — DX: Other obstructive and reflux uropathy: N13.8

## 2016-09-29 HISTORY — DX: Diverticulosis of large intestine without perforation or abscess without bleeding: K57.30

## 2016-09-29 HISTORY — DX: Presence of dental prosthetic device (complete) (partial): Z97.2

## 2016-09-29 HISTORY — DX: Unspecified osteoarthritis, unspecified site: M19.90

## 2016-09-29 HISTORY — DX: Cough: R05

## 2016-09-29 HISTORY — DX: Personal history of adenomatous and serrated colon polyps: Z86.0101

## 2016-09-29 HISTORY — DX: Prediabetes: R73.03

## 2016-09-29 HISTORY — DX: Diaphragmatic hernia without obstruction or gangrene: K44.9

## 2016-09-29 HISTORY — DX: Sexual dysfunction, unspecified: R37

## 2016-09-29 HISTORY — DX: Other specified postprocedural states: Z98.890

## 2016-09-29 HISTORY — DX: Personal history of other diseases of the digestive system: Z87.19

## 2016-09-29 LAB — POCT I-STAT, CHEM 8
BUN: 24 mg/dL — ABNORMAL HIGH (ref 6–20)
Calcium, Ion: 1.2 mmol/L (ref 1.15–1.40)
Chloride: 107 mmol/L (ref 101–111)
Creatinine, Ser: 1 mg/dL (ref 0.61–1.24)
Glucose, Bld: 95 mg/dL (ref 65–99)
HEMATOCRIT: 41 % (ref 39.0–52.0)
HEMOGLOBIN: 13.9 g/dL (ref 13.0–17.0)
Potassium: 3.6 mmol/L (ref 3.5–5.1)
SODIUM: 144 mmol/L (ref 135–145)
TCO2: 27 mmol/L (ref 0–100)

## 2016-09-29 SURGERY — INSERTION, PENILE PROSTHESIS, INFLATABLE
Anesthesia: General | Site: Penis

## 2016-09-29 MED ORDER — BELLADONNA ALKALOIDS-OPIUM 16.2-60 MG RE SUPP
RECTAL | Status: AC
  Filled 2016-09-29: qty 1

## 2016-09-29 MED ORDER — ONDANSETRON HCL 4 MG/2ML IJ SOLN
INTRAMUSCULAR | Status: AC
  Filled 2016-09-29: qty 2

## 2016-09-29 MED ORDER — FENTANYL CITRATE (PF) 100 MCG/2ML IJ SOLN
25.0000 ug | INTRAMUSCULAR | Status: DC | PRN
  Administered 2016-09-29 (×4): 25 ug via INTRAVENOUS
  Filled 2016-09-29: qty 1

## 2016-09-29 MED ORDER — EPHEDRINE 5 MG/ML INJ
INTRAVENOUS | Status: AC
  Filled 2016-09-29: qty 10

## 2016-09-29 MED ORDER — ONDANSETRON HCL 4 MG/2ML IJ SOLN
INTRAMUSCULAR | Status: DC | PRN
  Administered 2016-09-29: 4 mg via INTRAVENOUS

## 2016-09-29 MED ORDER — MIDAZOLAM HCL 5 MG/5ML IJ SOLN
INTRAMUSCULAR | Status: DC | PRN
  Administered 2016-09-29 (×2): 1 mg via INTRAVENOUS

## 2016-09-29 MED ORDER — PROMETHAZINE HCL 25 MG/ML IJ SOLN
6.2500 mg | INTRAMUSCULAR | Status: DC | PRN
  Filled 2016-09-29: qty 1

## 2016-09-29 MED ORDER — KETOROLAC TROMETHAMINE 30 MG/ML IJ SOLN
INTRAMUSCULAR | Status: DC | PRN
  Administered 2016-09-29: 30 mg via INTRAVENOUS

## 2016-09-29 MED ORDER — MIDAZOLAM HCL 2 MG/2ML IJ SOLN
INTRAMUSCULAR | Status: AC
  Filled 2016-09-29: qty 2

## 2016-09-29 MED ORDER — ARTIFICIAL TEARS OP OINT
TOPICAL_OINTMENT | OPHTHALMIC | Status: AC
  Filled 2016-09-29: qty 3.5

## 2016-09-29 MED ORDER — CEFAZOLIN SODIUM-DEXTROSE 2-4 GM/100ML-% IV SOLN
2.0000 g | INTRAVENOUS | Status: AC
  Administered 2016-09-29: 2 g via INTRAVENOUS
  Filled 2016-09-29: qty 100

## 2016-09-29 MED ORDER — GENTAMICIN SULFATE 40 MG/ML IJ SOLN
120.0000 mg | INTRAVENOUS | Status: DC
  Filled 2016-09-29: qty 3

## 2016-09-29 MED ORDER — CEFAZOLIN SODIUM-DEXTROSE 2-4 GM/100ML-% IV SOLN
INTRAVENOUS | Status: AC
  Filled 2016-09-29: qty 100

## 2016-09-29 MED ORDER — SODIUM CHLORIDE 0.9 % IR SOLN
Status: DC | PRN
  Administered 2016-09-29: 300 mL

## 2016-09-29 MED ORDER — EPHEDRINE SULFATE-NACL 50-0.9 MG/10ML-% IV SOSY
PREFILLED_SYRINGE | INTRAVENOUS | Status: DC | PRN
  Administered 2016-09-29 (×4): 10 mg via INTRAVENOUS

## 2016-09-29 MED ORDER — ACETAMINOPHEN 500 MG PO TABS
1000.0000 mg | ORAL_TABLET | Freq: Four times a day (QID) | ORAL | Status: DC | PRN
  Administered 2016-09-29: 1000 mg via ORAL
  Filled 2016-09-29: qty 2

## 2016-09-29 MED ORDER — FENTANYL CITRATE (PF) 100 MCG/2ML IJ SOLN
INTRAMUSCULAR | Status: AC
  Filled 2016-09-29: qty 2

## 2016-09-29 MED ORDER — SODIUM CHLORIDE 0.9 % IV SOLN
INTRAVENOUS | Status: DC | PRN
  Administered 2016-09-29: 500 mL

## 2016-09-29 MED ORDER — KETOROLAC TROMETHAMINE 30 MG/ML IJ SOLN
INTRAMUSCULAR | Status: AC
  Filled 2016-09-29: qty 1

## 2016-09-29 MED ORDER — DEXTROSE 5 % IV SOLN
5.0000 mg/kg | INTRAVENOUS | Status: AC
  Administered 2016-09-29: 330 mg via INTRAVENOUS
  Filled 2016-09-29 (×2): qty 8.25

## 2016-09-29 MED ORDER — DEXAMETHASONE SODIUM PHOSPHATE 10 MG/ML IJ SOLN
INTRAMUSCULAR | Status: AC
  Filled 2016-09-29: qty 1

## 2016-09-29 MED ORDER — LIDOCAINE HCL (CARDIAC) 20 MG/ML IV SOLN: INTRAVENOUS | Status: DC | PRN

## 2016-09-29 MED ORDER — LIDOCAINE 2% (20 MG/ML) 5 ML SYRINGE
INTRAMUSCULAR | Status: AC
  Filled 2016-09-29: qty 5

## 2016-09-29 MED ORDER — OXYCODONE HCL 5 MG/5ML PO SOLN
5.0000 mg | Freq: Once | ORAL | Status: DC | PRN
  Filled 2016-09-29: qty 5

## 2016-09-29 MED ORDER — LACTATED RINGERS IV SOLN
INTRAVENOUS | Status: DC
  Administered 2016-09-29 (×2): via INTRAVENOUS
  Filled 2016-09-29: qty 1000

## 2016-09-29 MED ORDER — BUPIVACAINE LIPOSOME 1.3 % IJ SUSP
INTRAMUSCULAR | Status: DC | PRN
  Administered 2016-09-29: 20 mL

## 2016-09-29 MED ORDER — BUPIVACAINE LIPOSOME 1.3 % IJ SUSP
INTRAMUSCULAR | Status: AC
  Filled 2016-09-29: qty 20

## 2016-09-29 MED ORDER — FENTANYL CITRATE (PF) 100 MCG/2ML IJ SOLN
INTRAMUSCULAR | Status: DC | PRN
  Administered 2016-09-29 (×2): 50 ug via INTRAVENOUS

## 2016-09-29 MED ORDER — DEXAMETHASONE SODIUM PHOSPHATE 4 MG/ML IJ SOLN
INTRAMUSCULAR | Status: DC | PRN
  Administered 2016-09-29: 10 mg via INTRAVENOUS

## 2016-09-29 MED ORDER — OXYCODONE-ACETAMINOPHEN 5-325 MG PO TABS: 1.0000 | ORAL_TABLET | ORAL | 0 refills | Status: DC | PRN

## 2016-09-29 MED ORDER — PROPOFOL 10 MG/ML IV BOLUS
INTRAVENOUS | Status: AC
  Filled 2016-09-29: qty 40

## 2016-09-29 MED ORDER — PROPOFOL 10 MG/ML IV BOLUS
INTRAVENOUS | Status: DC | PRN
Start: 2016-09-29 — End: 2016-09-29
  Administered 2016-09-29: 100 mg via INTRAVENOUS
  Administered 2016-09-29: 200 mg via INTRAVENOUS
  Administered 2016-09-29: 30 mg via INTRAVENOUS
  Administered 2016-09-29: 50 mg via INTRAVENOUS

## 2016-09-29 MED ORDER — LIDOCAINE 2% (20 MG/ML) 5 ML SYRINGE
INTRAMUSCULAR | Status: DC | PRN
  Administered 2016-09-29: 20 mg via INTRAVENOUS
  Administered 2016-09-29: 80 mg via INTRAVENOUS

## 2016-09-29 MED ORDER — OXYCODONE HCL 5 MG PO TABS
5.0000 mg | ORAL_TABLET | Freq: Once | ORAL | Status: DC | PRN
  Filled 2016-09-29: qty 1

## 2016-09-29 MED ORDER — ACETAMINOPHEN 500 MG PO TABS
ORAL_TABLET | ORAL | Status: AC
  Filled 2016-09-29: qty 2

## 2016-09-29 MED ORDER — AMOXICILLIN-POT CLAVULANATE 875-125 MG PO TABS: 1.0000 | ORAL_TABLET | Freq: Two times a day (BID) | ORAL | 0 refills | Status: AC

## 2016-09-29 SURGICAL SUPPLY — 71 items
ADH SKN CLS APL DERMABOND .7 (GAUZE/BANDAGES/DRESSINGS) ×2
APL SKNCLS STERI-STRIP NONHPOA (GAUZE/BANDAGES/DRESSINGS)
APPLICATOR COTTON TIP 6IN STRL (MISCELLANEOUS) IMPLANT
BAG DECANTER FOR FLEXI CONT (MISCELLANEOUS) ×3 IMPLANT
BAG URINE DRAINAGE (UROLOGICAL SUPPLIES) ×3 IMPLANT
BANDAGE CO FLEX L/F 2IN X 5YD (GAUZE/BANDAGES/DRESSINGS) IMPLANT
BENZOIN TINCTURE PRP APPL 2/3 (GAUZE/BANDAGES/DRESSINGS) IMPLANT
BLADE CLIPPER SURG (BLADE) ×3 IMPLANT
BLADE HEX COATED 2.75 (ELECTRODE) ×3 IMPLANT
BLADE SURG 15 STRL LF DISP TIS (BLADE) ×1 IMPLANT
BLADE SURG 15 STRL SS (BLADE) ×3
BNDG GAUZE ELAST 4 BULKY (GAUZE/BANDAGES/DRESSINGS) ×3 IMPLANT
BRIEF STRETCH FOR OB PAD LRG (UNDERPADS AND DIAPERS) ×3 IMPLANT
CANISTER SUCTION 1200CC (MISCELLANEOUS) ×2 IMPLANT
CANISTER SUCTION 2500CC (MISCELLANEOUS) IMPLANT
CATH FOLEY 2WAY SLVR  5CC 16FR (CATHETERS) ×2
CATH FOLEY 2WAY SLVR 5CC 16FR (CATHETERS) ×1 IMPLANT
CHLORAPREP W/TINT 26ML (MISCELLANEOUS) ×6 IMPLANT
CLOSURE WOUND 1/2 X4 (GAUZE/BANDAGES/DRESSINGS)
COVER BACK TABLE 60X90IN (DRAPES) ×3 IMPLANT
COVER MAYO STAND STRL (DRAPES) ×6 IMPLANT
DERMABOND ADVANCED (GAUZE/BANDAGES/DRESSINGS) ×4
DERMABOND ADVANCED .7 DNX12 (GAUZE/BANDAGES/DRESSINGS) ×1 IMPLANT
DISSECTOR ROUND CHERRY 3/8 STR (MISCELLANEOUS) ×2 IMPLANT
DRAPE EXTREMITY T 121X128X90 (DRAPE) ×3 IMPLANT
ELECT REM PT RETURN 9FT ADLT (ELECTROSURGICAL) ×3
ELECTRODE REM PT RTRN 9FT ADLT (ELECTROSURGICAL) ×1 IMPLANT
GLOVE BIO SURGEON STRL SZ7.5 (GLOVE) ×6 IMPLANT
GOWN STRL REUS W/ TWL LRG LVL3 (GOWN DISPOSABLE) ×1 IMPLANT
GOWN STRL REUS W/ TWL XL LVL3 (GOWN DISPOSABLE) ×1 IMPLANT
GOWN STRL REUS W/TWL LRG LVL3 (GOWN DISPOSABLE) ×3
GOWN STRL REUS W/TWL XL LVL3 (GOWN DISPOSABLE) ×3
HOOK RETRACTION 12 ELAST STAY (MISCELLANEOUS) IMPLANT
KIT ROOM TURNOVER WOR (KITS) ×3 IMPLANT
KIT TITAN ASSEMBLY (Erectile Restoration) ×2 IMPLANT
KIT TITAN ASSEMBLY STANDARD (Erectile Restoration) ×1 IMPLANT
KIT TITAN ASSEMBLY STD (Erectile Restoration) IMPLANT
MANIFOLD NEPTUNE II (INSTRUMENTS) ×2 IMPLANT
NDL HYPO 25X1 1.5 SAFETY (NEEDLE) IMPLANT
NEEDLE HYPO 25X1 1.5 SAFETY (NEEDLE) IMPLANT
NS IRRIG 500ML POUR BTL (IV SOLUTION) ×3 IMPLANT
PACK BASIN DAY SURGERY FS (CUSTOM PROCEDURE TRAY) ×3 IMPLANT
PAD PREP 24X48 CUFFED NSTRL (MISCELLANEOUS) ×3 IMPLANT
PENCIL BUTTON HOLSTER BLD 10FT (ELECTRODE) ×3 IMPLANT
PLUG CATH AND CAP STER (CATHETERS) ×3 IMPLANT
PROS TITAN SCROT 0 ANG 18CM (Erectile Restoration) ×3 IMPLANT
PROSTHESIS TTN SCRO 0 ANG 18CM (Erectile Restoration) IMPLANT
RESERVOIR 75CC LOCKOUT BIOFLEX (Erectile Restoration) ×2 IMPLANT
RETRACTOR STAY HOOK 5MM (MISCELLANEOUS) ×2 IMPLANT
RETRACTOR WILSON SYSTEM (INSTRUMENTS) ×3 IMPLANT
SPONGE LAP 4X18 X RAY DECT (DISPOSABLE) ×3 IMPLANT
STRIP CLOSURE SKIN 1/2X4 (GAUZE/BANDAGES/DRESSINGS) IMPLANT
SURGILUBE 2OZ TUBE FLIPTOP (MISCELLANEOUS) ×3 IMPLANT
SUT PDS AB 2-0 CT2 27 (SUTURE) ×12 IMPLANT
SUT VIC AB 2-0 UR6 27 (SUTURE) ×18 IMPLANT
SUT VIC AB 3-0 54X BRD REEL (SUTURE) IMPLANT
SUT VIC AB 3-0 BRD 54 (SUTURE) ×12
SUT VIC AB 3-0 PS2 18 (SUTURE) ×6
SUT VIC AB 3-0 PS2 18XBRD (SUTURE) ×2 IMPLANT
SUT VICRYL 4-0 PS2 18IN ABS (SUTURE) ×3 IMPLANT
SYR 20CC LL (SYRINGE) ×6 IMPLANT
SYR 50ML LL SCALE MARK (SYRINGE) ×6 IMPLANT
SYR BULB IRRIGATION 50ML (SYRINGE) ×3 IMPLANT
SYR CONTROL 10ML LL (SYRINGE) ×2 IMPLANT
SYRINGE 10CC LL (SYRINGE) ×3 IMPLANT
TOWEL OR 17X24 6PK STRL BLUE (TOWEL DISPOSABLE) ×6 IMPLANT
TRAY DSU PREP LF (CUSTOM PROCEDURE TRAY) ×3 IMPLANT
TUBE CONNECTING 12'X1/4 (SUCTIONS) ×1
TUBE CONNECTING 12X1/4 (SUCTIONS) ×1 IMPLANT
WATER STERILE IRR 500ML POUR (IV SOLUTION) ×3 IMPLANT
YANKAUER SUCT BULB TIP NO VENT (SUCTIONS) ×3 IMPLANT

## 2016-09-29 NOTE — Discharge Instructions (Signed)
°  Post Anesthesia Home Care Instructions  Activity: Get plenty of rest for the remainder of the day. A responsible adult should stay with you for 24 hours following the procedure.  For the next 24 hours, DO NOT: -Drive a car -Operate machinery -Drink alcoholic beverages -Take any medication unless instructed by your physician -Make any legal decisions or sign important papers.  Meals: Start with liquid foods such as gelatin or soup. Progress to regular foods as tolerated. Avoid greasy, spicy, heavy foods. If nausea and/or vomiting occur, drink only clear liquids until the nausea and/or vomiting subsides. Call your physician if vomiting continues.  Special Instructions/Symptoms: Your throat may feel dry or sore from the anesthesia or the breathing tube placed in your throat during surgery. If this causes discomfort, gargle with warm salt water. The discomfort should disappear within 24 hours.  If you had a scopolamine patch placed behind your ear for the management of post- operative nausea and/or vomiting:  1. The medication in the patch is effective for 72 hours, after which it should be removed.  Wrap patch in a tissue and discard in the trash. Wash hands thoroughly with soap and water. 2. You may remove the patch earlier than 72 hours if you experience unpleasant side effects which may include dry mouth, dizziness or visual disturbances. 3. Avoid touching the patch. Wash your hands with soap and water after contact with the patch.   Information for Discharge Teaching: EXPAREL (bupivacaine liposome injectable suspension)   Your surgeon gave you EXPAREL(bupivacaine) in your surgical incision to help control your pain after surgery.   EXPAREL is a local anesthetic that provides pain relief by numbing the tissue around the surgical site.  EXPAREL is designed to release pain medication over time and can control pain for up to 72 hours.  Depending on how you respond to EXPAREL, you may  require less pain medication during your recovery.  Possible side effects:  Temporary loss of sensation or ability to move in the area where bupivacaine was injected.  Nausea, vomiting, constipation  Rarely, numbness and tingling in your mouth or lips, lightheadedness, or anxiety may occur.  Call your doctor right away if you think you may be experiencing any of these sensations, or if you have other questions regarding possible side effects.  Follow all other discharge instructions given to you by your surgeon or nurse. Eat a healthy diet and drink plenty of water or other fluids.  If you return to the hospital for any reason within 96 hours following the administration of EXPAREL, please inform your health care providers. 

## 2016-09-29 NOTE — Interval H&P Note (Signed)
History and Physical Interval Note:  09/29/2016 8:07 AM  Don Johnson  has presented today for surgery, with the diagnosis of ORGAN SEXUAL DYSFUNCTION  The various methods of treatment have been discussed with the patient and family. After consideration of risks, benefits and other options for treatment, the patient has consented to  Procedure(s): [place] (N/A) as a surgical intervention .  The patient's history has been reviewed, patient examined, no change in status, stable for surgery.  I have reviewed the patient's chart and labs.  Questions were answered to the patient's satisfaction.     [name] [name]  Pt seen and examined. H/P reviewed: note: pt and wife will eventually  use " Astroglide" for lubrication.    Surgery aftercare discussed with patient and son.We will try for op surgery, b/c it is so much more cost effective, and he will be taught how to remove his catheter himself in AM. He can begin showering tomorrow. He is not home-bound, but should limit his outside activity to 1 hr ( ie: church) this weekend, and sit in semi-fowler's position as much as possible to limit scrotal swelling.

## 2016-09-29 NOTE — Anesthesia Postprocedure Evaluation (Addendum)
Anesthesia Post Note  Patient: OSAZE KLEINFELDT  Procedure(s) Performed: Procedure(s) (LRB): PENILE PROTHESIS INFLATABLE (N/A)  Patient location during evaluation: PACU Anesthesia Type: General Level of consciousness: awake and alert Pain management: pain level controlled Vital Signs Assessment: post-procedure vital signs reviewed and stable Respiratory status: spontaneous breathing, nonlabored ventilation, respiratory function stable and patient connected to nasal cannula oxygen Cardiovascular status: blood pressure returned to baseline and stable Postop Assessment: no signs of nausea or vomiting Anesthetic complications: no       Last Vitals:  Vitals:   09/29/16 1130 09/29/16 1145  BP: (!) 128/56 135/63  Pulse: 79 76  Resp: 17 15  Temp:      Last Pain:  Vitals:   09/29/16 1110  TempSrc:   PainSc: Asleep                 Eusebio Blazejewski S

## 2016-09-29 NOTE — Anesthesia Procedure Notes (Signed)
Procedure Name: LMA Insertion Date/Time: 09/29/2016 8:36 AM Performed by: Myrtie Soman Pre-anesthesia Checklist: Patient identified, Emergency Drugs available, Suction available and Patient being monitored Patient Re-evaluated:Patient Re-evaluated prior to inductionOxygen Delivery Method: Circle system utilized Preoxygenation: Pre-oxygenation with 100% oxygen Intubation Type: IV induction Ventilation: Mask ventilation without difficulty LMA: LMA inserted LMA Size: 4.0 Number of attempts: 1 Airway Equipment and Method: Bite block Placement Confirmation: positive ETCO2 Tube secured with: Tape Dental Injury: Teeth and Oropharynx as per pre-operative assessment

## 2016-09-29 NOTE — Anesthesia Preprocedure Evaluation (Signed)
Anesthesia Evaluation  Patient identified by MRN, date of birth, ID band Patient awake    Reviewed: Allergy & Precautions, NPO status , Patient's Chart, lab work & pertinent test results  Airway Mallampati: II  TM Distance: >3 FB Neck ROM: Full    Dental no notable dental hx.    Pulmonary neg pulmonary ROS,    Pulmonary exam normal breath sounds clear to auscultation       Cardiovascular negative cardio ROS Normal cardiovascular exam Rhythm:Regular Rate:Normal     Neuro/Psych negative neurological ROS  negative psych ROS   GI/Hepatic Neg liver ROS, GERD  Medicated,  Endo/Other  negative endocrine ROS  Renal/GU negative Renal ROS  negative genitourinary   Musculoskeletal negative musculoskeletal ROS (+)   Abdominal   Peds negative pediatric ROS (+)  Hematology negative hematology ROS (+)   Anesthesia Other Findings   Reproductive/Obstetrics negative OB ROS                             Anesthesia Physical Anesthesia Plan  ASA: II  Anesthesia Plan: General   Post-op Pain Management:    Induction: Intravenous  Airway Management Planned: LMA  Additional Equipment:   Intra-op Plan:   Post-operative Plan: Extubation in OR  Informed Consent: I have reviewed the patients History and Physical, chart, labs and discussed the procedure including the risks, benefits and alternatives for the proposed anesthesia with the patient or authorized representative who has indicated his/her understanding and acceptance.   Dental advisory given  Plan Discussed with: CRNA  Anesthesia Plan Comments:         Anesthesia Quick Evaluation

## 2016-09-29 NOTE — Transfer of Care (Signed)
  Last Vitals:  Vitals:   09/29/16 0700  BP: 140/67  Pulse: (!) 50  Resp: 16  Temp: 36.4 C    Last Pain:  Vitals:   09/29/16 0700  TempSrc: Oral      Patients Stated Pain Goal: 7 (09/29/16 [id number])  Immediate Anesthesia Transfer of Care Note  Patient: Don Johnson  Procedure(s) Performed: Procedure(s) (LRB): PENILE PROTHESIS INFLATABLE (N/A)  Patient Location: PACU  Anesthesia Type: General  Level of Consciousness: awake, alert  and oriented  Airway & Oxygen Therapy: Patient Spontanous Breathing and Patient connected to nasal cannula oxygen  Post-op Assessment: Report given to PACU RN and Post -op Vital signs reviewed and stable  Post vital signs: Reviewed and stable  Complications: No apparent anesthesia complications

## 2016-09-29 NOTE — Op Note (Signed)
Operative Note:  Pre-operative Diagnosis: Erectile dysfunction  Post-operative Diagnosis: Same  Procedure(s): PENILE PROTHESIS INFLATABLE  Surgeon: Surgeon(s) and Role:    * Carolan Clines, MD - Primary   Resident:  Virginia Crews, MD  EBL: 50 mL  IVF: See anesthesia record  UOP: See anesthesia record  Drains: 16 French foley catheter  Implants:   Implant Name Type Inv. Item Serial No. Manufacturer Lot No. LRB No. Used  KIT TITAN ASSEMBLY - [id number] Erectile Restoration KIT TITAN ASSEMBLY  MENTOR [id number] N/A 1  PROS TITAN SCROT 0 ANG 18CM - [id number] Erectile Restoration PROS TITAN SCROT 0 ANG 18CM  COLOPLAST [id number] N/A 1  RESERVOIR 75CC LOCKOUT BIOFLEX - [id number] Erectile Restoration RESERVOIR 75CC LOCKOUT BIOFLEX   MENTOR [id number] N/A 1   Specimens: None  Complications: None  Indications for Surgery: 76 y.o. male with with erectile dysfunction not responsive to oral or injection medications. Risks, benefits, and alternatives of the above procedure were discussed previously in detail and informed consents was signed and verified.  Findings:  - Corporal measurements: Right 10 cm proximally + 10 cm distally; Left 10 cm proximally + 10 cm distally.  - Successful placement of Coloplast Titan: Right 18 cm + 2.0 rear tip extender; Left 18 cm with 2 cm rear tip extender.   Procedure Details: The patient and consent was verified in the pre-op holding area and brought to the operating room where they were placed on the operating table. Pre-induction time out was called and general anesthesia was induced. SCDs were placed and IV antibiotics were started (intravenous vancomycin and gentamicin).  The patient was brought back to the operative suite. A 10 minute betadine scrub was performed and he was then prepped with a chloraprep. The patient was then draped in the usual sterile fashion. A 16 Fr foley catheter was placed per urethra.  The penis was then placed on stretch with the  aid of a hook through the meatus attached to the lonestar retractor. A 4 cm penoscrotal incision was made 1 finger breadth above the peno-scrotal junction. The subcutaneous tissues were opened with eletrocautery and the Henry sweep maneuver was performed to clear the tissue off of the corpora bilaterally. Metzenbaum scissors were used to free off the periurethral attachments. Care was taken throughout this time for careful hemostasis. The hooks for the [facility] retractor were placed.   We then placed 2 stay sutures on the medial and lateral aspect of the corporal bodies with 2-0 PDS. Two centimeter corporotomies were made bilaterally with bovie electrocautery on cutting current. We then used metzenbaum scissors to sharply and bluntly dissect the corporal tissue just within the corporotomy. We then used sequential dilators and dilated the proximal and distal corpora from #9 to #12, bilaterally. There was no resistance as the dilators were passed into the tip of the corpora. There was no evidence of perforation or crossover.     We then utilized a Furlow device and measured him to be 10 cm proximal and 10 cm distally on the right and 10 cm proximally and 10 cm distally on the left.  The location for the reservoir was chosen to be on the patient's rightside. We used blunt dissection along the right spermatic cord to create a space in the retropubic space. We then placed the reservoir and filled it with 60 cc of normal saline and then a rubber shod was placed on the tubing. The tubing was secured with a pursestring 2-0 Vicryl.  We then turned our  attention to placing the cylinders. We placed the proximal end of the right cylinder into the corpora and seated it with the aid of the applicator. We the placed the string attached to the distal end of the cylinder through a Martin needle. We then attached the needle to the [facility] and advanced the Furlow through the corporal incision up to the glans. The needle was then  advanced through the glans and the string was then secured with a snap. We then turned our attention to the left corpora. A similar technique was used to place the left cylinder. We noted that both cylinders were symmetric and present at the glans level. Once this was complete we then tested the device and noted no leaks and that the cylinders were symmetric and the position of the bilateral distal tips were under the glans. We left the device partially inflated..  Once both cylinders were appropriately seated in the corpora we then closed both corpora with running 2-0 Vicryl sutures before we tied the stay sutures over the cylinder.  We then proceeded to make a subdartos pocket in the scrotum for the pump. Once this was complete the pump was placed the the pump in the pouch and the neck was then closed with a running 3-0 Vicryl suture. We then turned our attention to connecting the device. The tubing was cut to length and then the locking clips were placed on either end of the tubing. A barrel connector was then placed on on end of the tubing. The tubing was then irrigated with normal saline to ensure no air bubbles in the tubing. The free end of the barrel was then attached to the reservoir tubing and using the crimping tool the barrel was secured to the tubing. We then closed the dartos over the tubing using a 3-0 vicryl in a running fashion and a subcutaneous layer with 3-0 Vicryl. We injected 44m of Exparel under the skin. The skin was then closed with 4-0 Vicryl in a subcuticular running fashion. Skin glue was then placed over the scrotal incision. We then removed the strings attached to the distal ends of the cylinders and placed skin glue over the incisions in the glans. We then placed a scrotal fluff and a scrotal support and this then concluded the procedure which was well tolerated by the patient. The patient was transferred to the PACU in stable condition.   Teaching Physician Attestation: Dr.  TZadie Cleverlywas present and scrubbed for the entirety of the procedure  Plan: Patient is to be discharged home.  His foley will be removed in the morning at home. He will followup in 2 weeks for a wound check. He will be discharged with 14 days of antibiotics.

## 2016-10-02 ENCOUNTER — Encounter (HOSPITAL_BASED_OUTPATIENT_CLINIC_OR_DEPARTMENT_OTHER): Payer: Self-pay | Admitting: Urology

## 2016-10-04 ENCOUNTER — Telehealth: Payer: Self-pay | Admitting: Internal Medicine

## 2016-10-04 NOTE — Telephone Encounter (Signed)
Patient Name: JARRYD Strawderman DOB: [birth year] Initial Comment Caller states husband has sores on upper part of back and halfway down-both sides; red and oozing blood; feels like pins; had penile implant surgery Friday; Nurse Assessment Nurse: Vallery Sa, RN, [place] Date/Time (Eastern Time): 10/04/2016 9:43:26 AM Confirm and document reason for call. If symptomatic, describe symptoms. ---Caller states that her husband had surgery 5 days ago for penile implant. He developed multiple sores on his back yesterday. No fever. Alert and responsive. Does the patient have any new or worsening symptoms? ---Yes Will a triage be completed? ---Yes Related visit to physician within the last 2 weeks? ---Yes Does the PT have any chronic conditions? (i.e. diabetes, asthma, etc.) ---Yes List chronic conditions. ---Penile Implant surgery 5 days ago, High Cholesterol Is this a behavioral health or substance abuse call? ---No Guidelines Guideline Title Affirmed Question Affirmed Notes Post-Op Symptoms and Questions Other post-op symptom or question (all triage questions negative) Sores [1] Small red streak or spreading redness (<2 inches; 5 cm) AND [2] no fever Final Disposition User See Physician within [place], RN, [place] Comments Scheduled for 8am appointment with [place], NP 10/05/16. they will call back if he develops fever or any new symptoms. Referrals REFERRED TO PCP OFFICE Disagree/Comply: Comply

## 2016-10-05 ENCOUNTER — Encounter: Payer: Self-pay | Admitting: Nurse Practitioner

## 2016-10-05 ENCOUNTER — Ambulatory Visit (INDEPENDENT_AMBULATORY_CARE_PROVIDER_SITE_OTHER): Payer: Medicare Other | Admitting: Nurse Practitioner

## 2016-10-05 VITALS — BP 126/68 | HR 58 | Temp 98.4°F | Resp 16 | Ht 65.5 in | Wt 145.0 lb

## 2016-10-05 DIAGNOSIS — L249 Irritant contact dermatitis, unspecified cause: Secondary | ICD-10-CM | POA: Diagnosis not present

## 2016-10-05 MED ORDER — TRIAMCINOLONE ACETONIDE 0.1 % EX OINT: 1.0000 "application " | TOPICAL_OINTMENT | Freq: Two times a day (BID) | CUTANEOUS | 0 refills | Status: DC

## 2016-10-05 MED ORDER — DIPHENHYDRAMINE HCL 25 MG PO CAPS: 25.0000 mg | ORAL_CAPSULE | Freq: Three times a day (TID) | ORAL | 0 refills | Status: DC | PRN

## 2016-10-05 NOTE — Patient Instructions (Signed)
Contact Dermatitis Introduction Dermatitis is redness, soreness, and swelling (inflammation) of the skin. Contact dermatitis is a reaction to certain substances that touch the skin. There are two types of contact dermatitis:  Irritant contact dermatitis. This type is caused by something that irritates your skin, such as dry hands from washing them too much. This type does not require previous exposure to the substance for a reaction to occur. This type is more common.  Allergic contact dermatitis. This type is caused by a substance that you are allergic to, such as a nickel allergy or poison ivy. This type only occurs if you have been exposed to the substance (allergen) before. Upon a repeat exposure, your body reacts to the substance. This type is less common. What are the causes? Many different substances can cause contact dermatitis. Irritant contact dermatitis is most commonly caused by exposure to:  Makeup.  Soaps.  Detergents.  Bleaches.  Acids.  Metal salts, such as nickel. Allergic contact dermatitis is most commonly caused by exposure to:  Poisonous plants.  Chemicals.  Jewelry.  Latex.  Medicines.  Preservatives in products, such as clothing. What increases the risk? This condition is more likely to develop in:  People who have jobs that expose them to irritants or allergens.  People who have certain medical conditions, such as asthma or eczema. What are the signs or symptoms? Symptoms of this condition may occur anywhere on your body where the irritant has touched you or is touched by you. Symptoms include:  Dryness or flaking.  Redness.  Cracks.  Itching.  Pain or a burning feeling.  Blisters.  Drainage of small amounts of blood or clear fluid from skin cracks. With allergic contact dermatitis, there may also be swelling in areas such as the eyelids, mouth, or genitals. How is this diagnosed? This condition is diagnosed with a medical history and  physical exam. A patch skin test may be performed to help determine the cause. If the condition is related to your job, you may need to see an occupational medicine specialist. How is this treated? Treatment for this condition includes figuring out what caused the reaction and protecting your skin from further contact. Treatment may also include:  Steroid creams or ointments. Oral steroid medicines may be needed in more severe cases.  Antibiotics or antibacterial ointments, if a skin infection is present.  Antihistamine lotion or an antihistamine taken by mouth to ease itching.  A bandage (dressing). Follow these instructions at home: [place] your skin as needed.  Apply cool compresses to the affected areas.  Try taking a bath with:  Epsom salts. Follow the instructions on the packaging. You can get these at your local pharmacy or grocery store.  Baking soda. Pour a small amount into the bath as directed by your health care provider.  Colloidal oatmeal. Follow the instructions on the packaging. You can get this at your local pharmacy or grocery store.  Try applying baking soda paste to your skin. Stir water into baking soda until it reaches a paste-like consistency.  Do not scratch your skin.  Bathe less frequently, such as every other day.  Bathe in lukewarm water. Avoid using hot water. Medicines  Take or apply over-the-counter and prescription medicines only as told by your health care provider.  If you were prescribed an antibiotic medicine, take or apply your antibiotic as told by your health care provider. Do not stop using the antibiotic even if your condition starts to improve. General instructions  Keep all follow-up visits as told by your health care provider. This is important.  Avoid the substance that caused your reaction. If you do not know what caused it, keep a journal to try to track what caused it. Write down:  What you eat.  What cosmetic  products you use.  What you drink.  What you wear in the affected area. This includes jewelry.  If you were given a dressing, take care of it as told by your health care provider. This includes when to change and remove it. Contact a health care provider if:  Your condition does not improve with treatment.  Your condition gets worse.  You have signs of infection such as swelling, tenderness, redness, soreness, or warmth in the affected area.  You have a fever.  You have new symptoms. Get help right away if:  You have a severe headache, neck pain, or neck stiffness.  You vomit.  You feel very sleepy.  You notice red streaks coming from the affected area.  Your bone or joint underneath the affected area becomes painful after the skin has healed.  The affected area turns darker.  You have difficulty breathing. This information is not intended to replace advice given to you by your health care provider. Make sure you discuss any questions you have with your health care provider. Document Released: 08/25/2000 Document Revised: 02/03/2016 Document Reviewed: 01/13/2015  2017 [employer]vier

## 2016-10-05 NOTE — Progress Notes (Signed)
Subjective:  Patient ID: Don Johnson, male    DOB: 1941-03-08  Age: 76 y.o. MRN: [id number]  CC: Back sores (x1 week, has sores on back that have some stinging when taking a shower)   Rash  This is a new problem. The current episode started in the past 7 days. The problem is unchanged. The affected locations include the back. The rash is characterized by itchiness and redness. It is unknown if there was an exposure to a precipitant. Pertinent negatives include no anorexia, congestion, cough, facial edema, fatigue, fever or joint pain. Past treatments include topical steroids. The treatment provided mild relief. There is no history of allergies, asthma, eczema or varicella.    Outpatient Medications Prior to Visit  Medication Sig Dispense Refill  . amoxicillin-clavulanate (AUGMENTIN) 875-125 MG tablet Take 1 tablet by mouth 2 (two) times daily. 28 tablet 0  . atorvastatin (LIPITOR) 40 MG tablet Take 1 tablet (40 mg total) by mouth daily. (Patient taking differently: Take 40 mg by mouth every morning. ) 90 tablet 3  . guaiFENesin-codeine (ROBITUSSIN AC) 100-10 MG/5ML syrup Take 10 mLs by mouth 4 (four) times daily as needed for cough. 180 mL 1  . omeprazole (PRILOSEC) 40 MG capsule Take 1 capsule (40 mg total) by mouth daily. (Patient taking differently: Take 40 mg by mouth every morning. ) 30 capsule 11  . oxyCODONE-acetaminophen (ROXICET) 5-325 MG tablet Take 1-2 tablets by mouth every 4 (four) hours as needed for severe pain. 30 tablet 0  . Psyllium (METAMUCIL PO) Take 1 scoop by mouth 2 (two) times daily.    . sertraline (ZOLOFT) 100 MG tablet Take 100 mg by mouth daily.      No facility-administered medications prior to visit.     ROS See HPI  Objective:  BP 126/68 (BP Location: Left Arm, Patient Position: Sitting, Cuff Size: Normal)   Pulse (!) 58   Temp 98.4 F (36.9 C) (Oral)   Resp 16   Ht 5' 5.5" (1.664 m)   Wt 145 lb (65.8 kg)   SpO2 91%   BMI 23.76 kg/m   BP  Readings from Last 3 Encounters:  10/05/16 126/68  09/29/16 (!) 137/58  07/31/16 134/74    Wt Readings from Last 3 Encounters:  10/05/16 145 lb (65.8 kg)  09/29/16 144 lb 8 oz (65.5 kg)  07/31/16 143 lb 12 oz (65.2 kg)    Physical Exam  Constitutional: He is oriented to person, place, and time. No distress.  Cardiovascular: Normal rate.   Pulmonary/Chest: Effort normal.  Neurological: He is alert and oriented to person, place, and time.  Skin: Skin is warm and dry. Rash noted. Rash is maculopapular. There is erythema.     Vitals reviewed.   Lab Results  Component Value Date   WBC 6.7 07/31/2016   HGB 13.9 09/29/2016   HCT 41.0 09/29/2016   PLT 251.0 07/31/2016   GLUCOSE 95 09/29/2016   CHOL 240 (H) 07/31/2016   TRIG 145.0 07/31/2016   HDL 60.60 07/31/2016   LDLDIRECT 149.1 06/30/2013   LDLCALC 150 (H) 07/31/2016   ALT 19 07/31/2016   AST 23 07/31/2016   NA 144 09/29/2016   K 3.6 09/29/2016   CL 107 09/29/2016   CREATININE 1.00 09/29/2016   BUN 24 (H) 09/29/2016   CO2 31 07/31/2016   TSH 2.50 07/31/2016   PSA 0.48 07/29/2015   HGBA1C 6.2 07/31/2016    Dg Chest 2 View  Result Date: 09/20/2016 CLINICAL DATA:  Sore throat and cough EXAM: CHEST  2 VIEW COMPARISON:  June 04, 2013 FINDINGS: There is no edema or consolidation. The heart size and pulmonary vascularity are normal. No adenopathy. There is a small hiatal hernia. There is atherosclerotic calcification in the aorta. No bone lesions. IMPRESSION: No edema or consolidation. Aortic atherosclerosis. Small hiatal hernia. Electronically Signed   By: Lowella Grip III M.D.   On: 09/20/2016 16:01    Assessment & Plan:   Don Johnson was seen today for back sores.  Diagnoses and all orders for this visit:  Irritant contact dermatitis, unspecified trigger -     triamcinolone ointment (KENALOG) 0.1 %; Apply 1 application topically 2 (two) times daily. -     diphenhydrAMINE (BENADRYL) 25 mg capsule; Take 1  capsule (25 mg total) by mouth every 8 (eight) hours as needed for itching.   I have discontinued [name] sertraline. I am also having him start on triamcinolone ointment and diphenhydrAMINE. Additionally, I am having him maintain his Psyllium (METAMUCIL PO), omeprazole, atorvastatin, guaiFENesin-codeine, oxyCODONE-acetaminophen, and amoxicillin-clavulanate.  Meds ordered this encounter  Medications  . triamcinolone ointment (KENALOG) 0.1 %    Sig: Apply 1 application topically 2 (two) times daily.    Dispense:  80 g    Refill:  0    Order Specific Question:   Supervising Provider    Answer:   Cassandria Anger [[id number]]  . diphenhydrAMINE (BENADRYL) 25 mg capsule    Sig: Take 1 capsule (25 mg total) by mouth every 8 (eight) hours as needed for itching.    Dispense:  20 capsule    Refill:  0    Order Specific Question:   Supervising Provider    Answer:   Cassandria Anger [[id number]]    Follow-up: Return if symptoms worsen or fail to improve.  Wilfred Lacy, NP

## 2016-10-13 DIAGNOSIS — N5201 Erectile dysfunction due to arterial insufficiency: Secondary | ICD-10-CM | POA: Diagnosis not present

## 2016-11-15 DIAGNOSIS — N5201 Erectile dysfunction due to arterial insufficiency: Secondary | ICD-10-CM | POA: Diagnosis not present

## 2017-01-10 ENCOUNTER — Encounter: Payer: Self-pay | Admitting: Internal Medicine

## 2017-01-11 ENCOUNTER — Other Ambulatory Visit: Payer: Self-pay | Admitting: Internal Medicine

## 2017-02-12 ENCOUNTER — Encounter: Payer: Self-pay | Admitting: Internal Medicine

## 2017-02-12 NOTE — Addendum Note (Signed)
Addendum  created 02/12/17 1013 by Myrtie Soman, MD   Sign clinical note

## 2017-04-12 ENCOUNTER — Ambulatory Visit (AMBULATORY_SURGERY_CENTER): Payer: Self-pay

## 2017-04-12 VITALS — Ht 66.5 in | Wt 146.4 lb

## 2017-04-12 DIAGNOSIS — Z86010 Personal history of colonic polyps: Secondary | ICD-10-CM

## 2017-04-12 DIAGNOSIS — Z8371 Family history of colonic polyps: Secondary | ICD-10-CM

## 2017-04-12 MED ORDER — NA SULFATE-K SULFATE-MG SULF 17.5-3.13-1.6 GM/177ML PO SOLN: ORAL | 0 refills | Status: DC

## 2017-04-12 NOTE — Progress Notes (Signed)
Per pt, no allergies to soy or egg products.Pt not taking any weight loss meds or using  O2 at home.   Pt refused Emmi video. 

## 2017-04-26 ENCOUNTER — Encounter: Payer: Medicare Other | Admitting: Internal Medicine

## 2017-05-01 ENCOUNTER — Encounter: Payer: Self-pay | Admitting: Internal Medicine

## 2017-05-01 ENCOUNTER — Ambulatory Visit (AMBULATORY_SURGERY_CENTER): Payer: Medicare Other | Admitting: Internal Medicine

## 2017-05-01 VITALS — BP 135/75 | HR 55 | Temp 98.2°F | Resp 13 | Ht 66.5 in | Wt 146.0 lb

## 2017-05-01 DIAGNOSIS — Z1211 Encounter for screening for malignant neoplasm of colon: Secondary | ICD-10-CM | POA: Diagnosis not present

## 2017-05-01 DIAGNOSIS — Z86010 Personal history of colonic polyps: Secondary | ICD-10-CM | POA: Diagnosis present

## 2017-05-01 DIAGNOSIS — D124 Benign neoplasm of descending colon: Secondary | ICD-10-CM

## 2017-05-01 LAB — HM COLONOSCOPY

## 2017-05-01 MED ORDER — SODIUM CHLORIDE 0.9 % IV SOLN: 500.0000 mL | INTRAVENOUS | Status: DC

## 2017-05-01 NOTE — Progress Notes (Signed)
To PACU VSS. Report to RN.tb 

## 2017-05-01 NOTE — Op Note (Signed)
[name] Patient Name: Don Johnson Procedure Date: 05/01/2017 9:51 AM MRN: [id number] Endoscopist: Docia Chuck. Henrene Pastor , MD Age: 76 Referring MD:  Date of Birth: 1941/09/09 Gender: Male Account #: [id number] Procedure:                Colonoscopy, with cold snare polypectomy x 1 Indications:              High risk colon cancer surveillance: Personal                            history of adenoma (10 mm or greater in size), High                            risk colon cancer surveillance: Personal history of                            adenoma with villous component. Previous                            examinations 2002 (negative), 2006 (advanced                            adenoma), 2007, 2010, 2013 (negative) Medicines:                Monitored Anesthesia Care Procedure:                Pre-Anesthesia Assessment:                           - Prior to the procedure, a History and Physical                            was performed, and patient medications and                            allergies were reviewed. The patient's tolerance of                            previous anesthesia was also reviewed. The risks                            and benefits of the procedure and the sedation                            options and risks were discussed with the patient.                            All questions were answered, and informed consent                            was obtained. Prior Anticoagulants: The patient has                            taken no previous anticoagulant or antiplatelet  agents. ASA Grade Assessment: II - A patient with                            mild systemic disease. After reviewing the risks                            and benefits, the patient was deemed in                            satisfactory condition to undergo the procedure.                           After obtaining informed consent, the colonoscope                            was  passed under direct vision. Throughout the                            procedure, the patient's blood pressure, pulse, and                            oxygen saturations were monitored continuously. The                            Colonoscope was introduced through the anus and                            advanced to the the cecum, identified by                            appendiceal orifice and ileocecal valve. The                            ileocecal valve, appendiceal orifice, and rectum                            were photographed. The quality of the bowel                            preparation was excellent. The colonoscopy was                            performed without difficulty. The patient tolerated                            the procedure well. The bowel preparation used was                            SUPREP. Scope In: 10:05:35 AM Scope Out: 10:23:19 AM Scope Withdrawal Time: 0 hours 13 minutes 21 seconds  Total Procedure Duration: 0 hours 17 minutes 44 seconds  Findings:                 A 2 mm polyp was found in the descending colon. The  polyp was removed with a cold snare. Resection and                            retrieval were complete.                           Multiple small and large-mouthed diverticula were                            found in the entire colon.                           The exam was otherwise without abnormality on                            direct and retroflexion views. Complications:            No immediate complications. Estimated blood loss:                            None. Estimated Blood Loss:     Estimated blood loss: none. Impression:               - One 2 mm polyp in the descending colon, removed                            with a cold snare. Resected and retrieved.                           - Diverticulosis in the entire examined colon.                           - The examination was otherwise normal on direct                             and retroflexion views. Recommendation:           - Patient has a contact number available for                            emergencies. The signs and symptoms of potential                            delayed complications were discussed with the                            patient. Return to normal activities tomorrow.                            Written discharge instructions were provided to the                            patient.                           - Resume previous diet.                           -  Continue present medications.                           - Await pathology results.                           - Repeat colonoscopy is not recommended for                            surveillance. Docia Chuck. Henrene Pastor, MD 05/01/2017 10:29:39 AM This report has been signed electronically.

## 2017-05-01 NOTE — Progress Notes (Signed)
Called to room to assist during endoscopic procedure.  Patient ID and intended procedure confirmed with present staff. Received instructions for my participation in the procedure from the performing physician.  

## 2017-05-01 NOTE — Patient Instructions (Signed)
YOU HAD AN ENDOSCOPIC PROCEDURE TODAY AT THE [facility]:   Refer to the procedure report that was given to you for any specific questions about what was found during the examination.  If the procedure report does not answer your questions, please call your gastroenterologist to clarify.  If you requested that your care partner not be given the details of your procedure findings, then the procedure report has been included in a sealed envelope for you to review at your convenience later.  YOU SHOULD EXPECT: Some feelings of bloating in the abdomen. Passage of more gas than usual.  Walking can help get rid of the air that was put into your GI tract during the procedure and reduce the bloating. If you had a lower endoscopy (such as a colonoscopy or flexible sigmoidoscopy) you may notice spotting of blood in your stool or on the toilet paper. If you underwent a bowel prep for your procedure, you may not have a normal bowel movement for a few days.  Please Note:  You might notice some irritation and congestion in your nose or some drainage.  This is from the oxygen used during your procedure.  There is no need for concern and it should clear up in a day or so.  SYMPTOMS TO REPORT IMMEDIATELY:   Following lower endoscopy (colonoscopy or flexible sigmoidoscopy):  Excessive amounts of blood in the stool  Significant tenderness or worsening of abdominal pains  Swelling of the abdomen that is new, acute  Fever of 100F or higher  For urgent or emergent issues, a gastroenterologist can be reached at any hour by calling [phone].   DIET:  We do recommend a small meal at first, but then you may proceed to your regular diet.  Drink plenty of fluids but you should avoid alcoholic beverages for 24 hours.  ACTIVITY:  You should plan to take it easy for the rest of today and you should NOT DRIVE or use heavy machinery until tomorrow (because of the sedation medicines used during the test).     FOLLOW UP: Our staff will call the number listed on your records the next business day following your procedure to check on you and address any questions or concerns that you may have regarding the information given to you following your procedure. If we do not reach you, we will leave a message.  However, if you are feeling well and you are not experiencing any problems, there is no need to return our call.  We will assume that you have returned to your regular daily activities without incident.  If any biopsies were taken you will be contacted by phone or by letter within the next 1-3 weeks.  Please call us at [phone] if you have not heard about the biopsies in 3 weeks.    SIGNATURES/CONFIDENTIALITY: You and/or your care partner have signed paperwork which will be entered into your electronic medical record.  These signatures attest to the fact that that the information above on your After Visit Summary has been reviewed and is understood.  Full responsibility of the confidentiality of this discharge information lies with you and/or your care-partner.  Polyp,diverticulosis, and high fiber diet information given.  

## 2017-05-02 ENCOUNTER — Telehealth: Payer: Self-pay

## 2017-05-02 NOTE — Telephone Encounter (Signed)
  Follow up Call-  Call back number 05/01/2017 12/31/2015  Post procedure Call Back phone  # [phone]  Permission to leave phone message Yes Yes  Some recent data might be hidden     Patient questions:  Do you have a fever, pain , or abdominal swelling? No. Pain Score  0 *  Have you tolerated food without any problems? Yes.    Have you been able to return to your normal activities? Yes.    Do you have any questions about your discharge instructions: Diet   No. Medications  No. Follow up visit  No.  Do you have questions or concerns about your Care? No.  Actions: * If pain score is 4 or above: No action needed, pain <4.

## 2017-05-07 ENCOUNTER — Encounter: Payer: Self-pay | Admitting: Internal Medicine

## 2017-06-21 ENCOUNTER — Ambulatory Visit (INDEPENDENT_AMBULATORY_CARE_PROVIDER_SITE_OTHER): Payer: Medicare Other | Admitting: General Practice

## 2017-06-21 DIAGNOSIS — Z23 Encounter for immunization: Secondary | ICD-10-CM

## 2017-06-30 IMAGING — DX DG CHEST 2V
2 series · 2 of 2 positions shown · non-contrast
Comparison: June 04, 2013

CLINICAL DATA: Sore throat and cough

EXAM:
CHEST  2 VIEW

[chest pa]
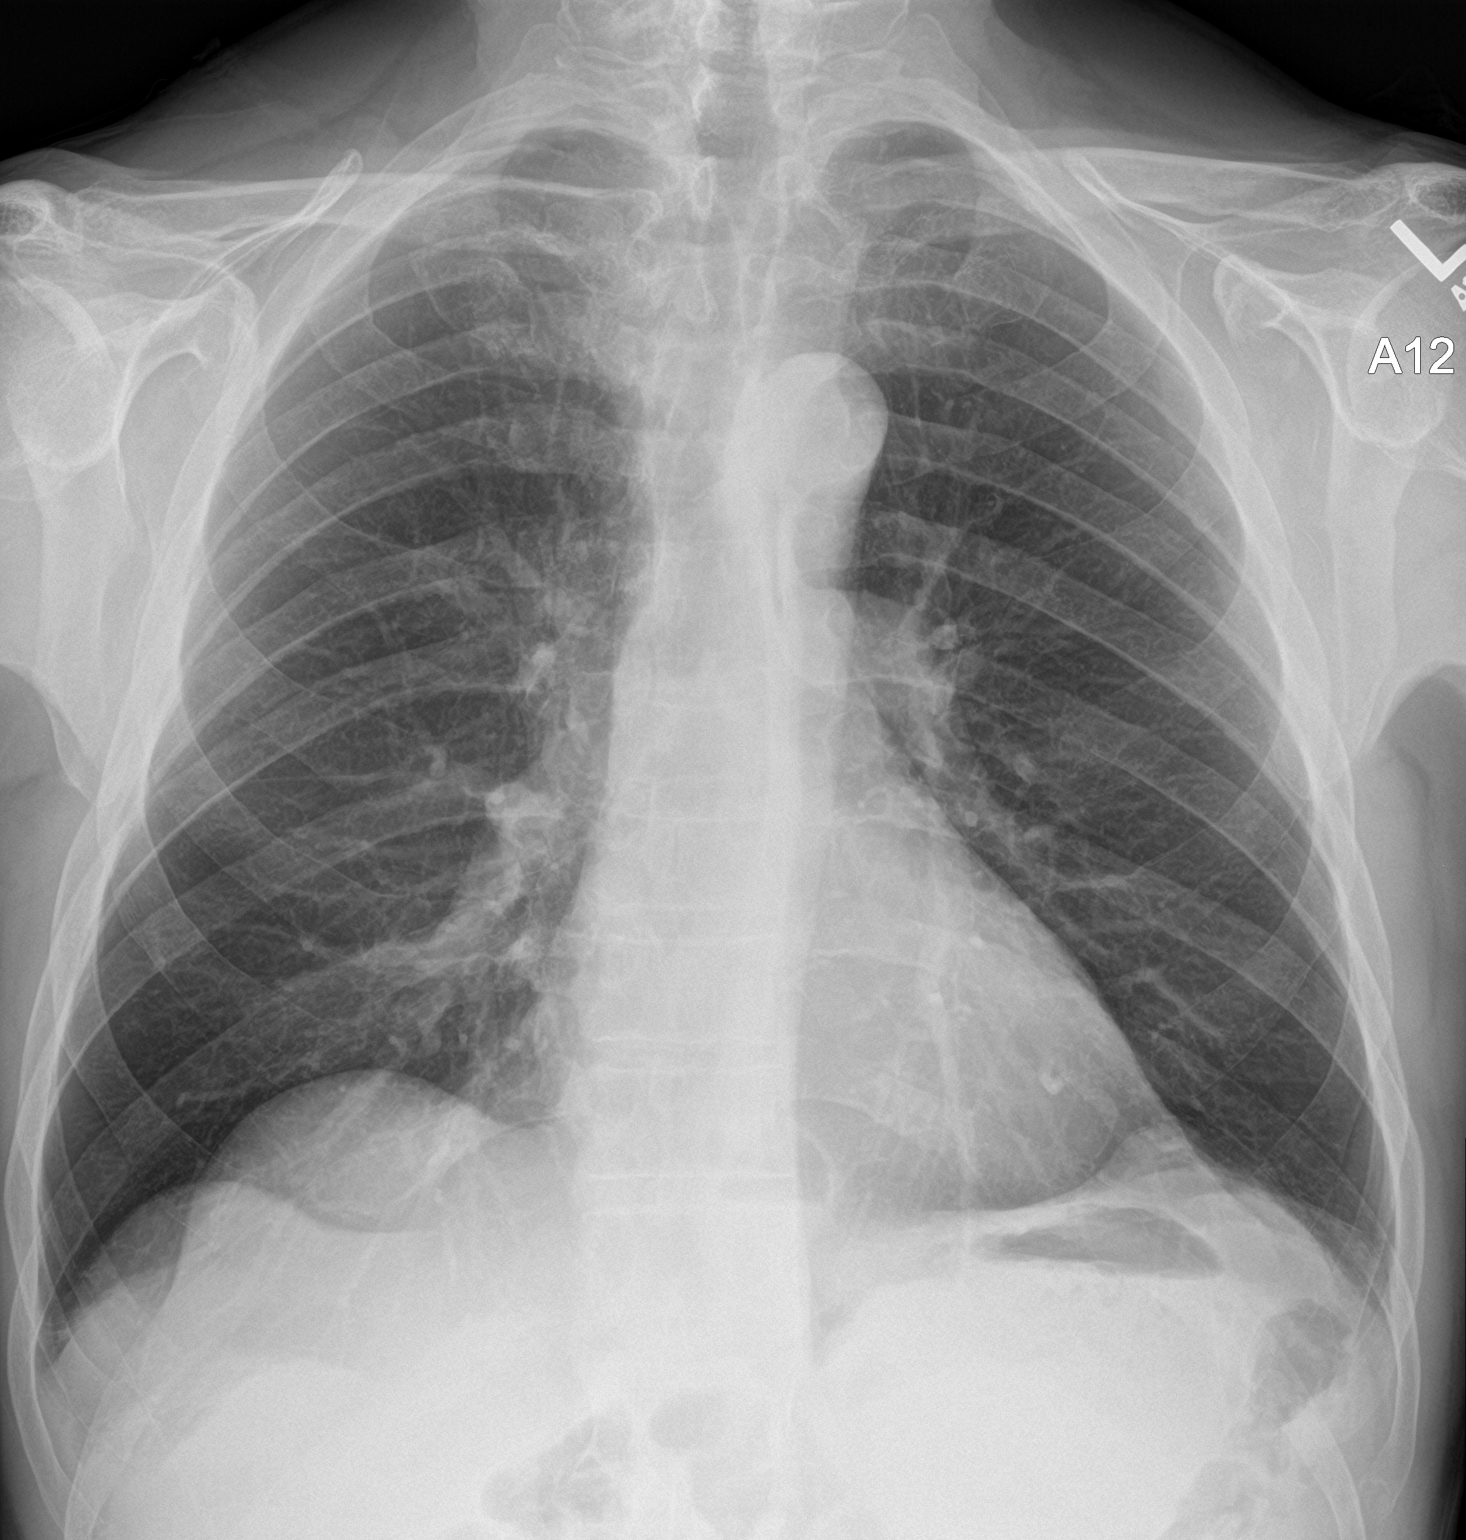

[chest lat]
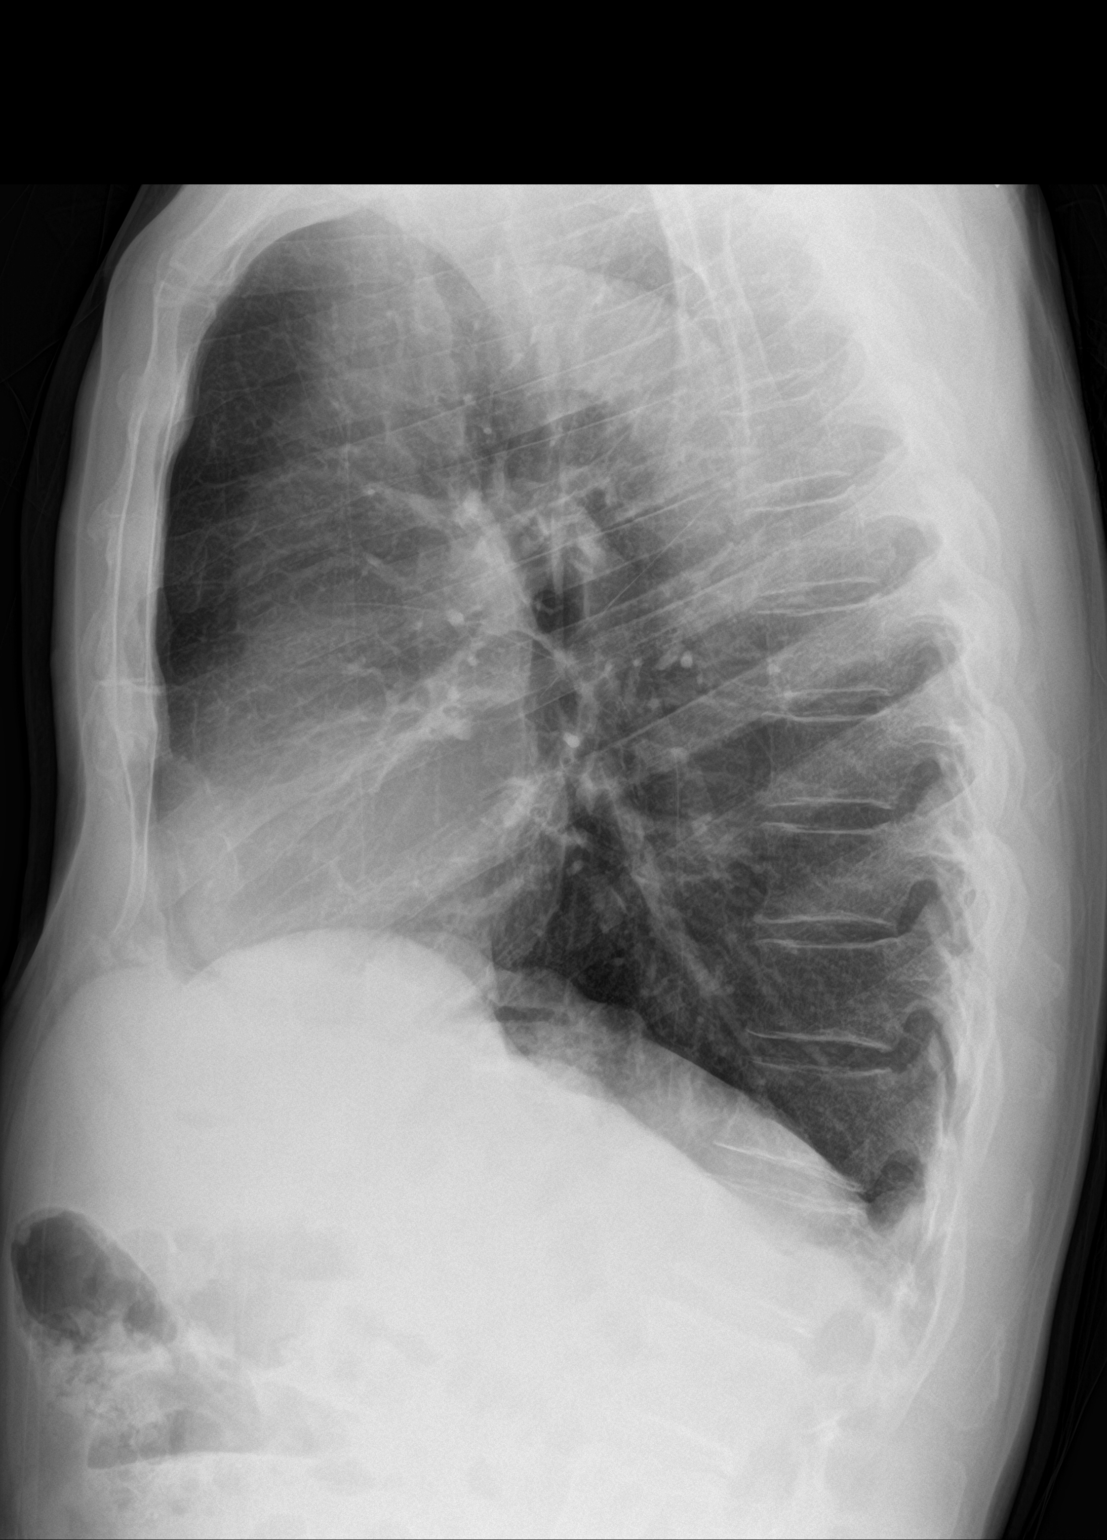

[2 of 2 positions shown; findings below may reference images not displayed]

FINDINGS: There is no edema or consolidation. The heart size and pulmonary
vascularity are normal. No adenopathy. There is a small hiatal
hernia. There is atherosclerotic calcification in the aorta. No bone
lesions.
IMPRESSION: No edema or consolidation. Aortic atherosclerosis. Small hiatal
hernia.

## 2017-08-01 ENCOUNTER — Ambulatory Visit: Payer: Medicare Other | Admitting: Internal Medicine

## 2017-08-08 DIAGNOSIS — M7542 Impingement syndrome of left shoulder: Secondary | ICD-10-CM | POA: Diagnosis not present

## 2017-08-09 DIAGNOSIS — M7542 Impingement syndrome of left shoulder: Secondary | ICD-10-CM | POA: Diagnosis not present

## 2017-08-13 DIAGNOSIS — M75122 Complete rotator cuff tear or rupture of left shoulder, not specified as traumatic: Secondary | ICD-10-CM | POA: Diagnosis not present

## 2017-08-13 DIAGNOSIS — M7542 Impingement syndrome of left shoulder: Secondary | ICD-10-CM | POA: Diagnosis not present

## 2017-08-15 ENCOUNTER — Encounter: Payer: Self-pay | Admitting: Internal Medicine

## 2017-08-15 ENCOUNTER — Ambulatory Visit (INDEPENDENT_AMBULATORY_CARE_PROVIDER_SITE_OTHER): Payer: Medicare Other | Admitting: Internal Medicine

## 2017-08-15 ENCOUNTER — Other Ambulatory Visit (INDEPENDENT_AMBULATORY_CARE_PROVIDER_SITE_OTHER): Payer: Medicare Other

## 2017-08-15 VITALS — BP 140/84 | HR 55 | Temp 98.2°F | Ht 66.5 in | Wt 146.0 lb

## 2017-08-15 DIAGNOSIS — R7303 Prediabetes: Secondary | ICD-10-CM | POA: Diagnosis not present

## 2017-08-15 DIAGNOSIS — N401 Enlarged prostate with lower urinary tract symptoms: Secondary | ICD-10-CM

## 2017-08-15 DIAGNOSIS — E785 Hyperlipidemia, unspecified: Secondary | ICD-10-CM

## 2017-08-15 DIAGNOSIS — R351 Nocturia: Secondary | ICD-10-CM

## 2017-08-15 DIAGNOSIS — Z0000 Encounter for general adult medical examination without abnormal findings: Secondary | ICD-10-CM | POA: Diagnosis not present

## 2017-08-15 LAB — COMPREHENSIVE METABOLIC PANEL
ALBUMIN: 4.7 g/dL (ref 3.5–5.2)
ALT: 21 U/L (ref 0–53)
AST: 22 U/L (ref 0–37)
Alkaline Phosphatase: 76 U/L (ref 39–117)
BUN: 20 mg/dL (ref 6–23)
CO2: 31 mEq/L (ref 19–32)
Calcium: 9 mg/dL (ref 8.4–10.5)
Chloride: 103 mEq/L (ref 96–112)
Creatinine, Ser: 0.96 mg/dL (ref 0.40–1.50)
GFR: 80.79 mL/min (ref >60.00)
Glucose, Bld: 104 mg/dL — ABNORMAL HIGH (ref 70–99)
POTASSIUM: 4.6 meq/L (ref 3.5–5.1)
Sodium: 141 mEq/L (ref 135–145)
TOTAL PROTEIN: 6.7 g/dL (ref 6.0–8.3)
Total Bilirubin: 0.7 mg/dL (ref 0.2–1.2)

## 2017-08-15 LAB — URINALYSIS, ROUTINE W REFLEX MICROSCOPIC
BILIRUBIN URINE: NEGATIVE
KETONES UR: NEGATIVE
LEUKOCYTES UA: NEGATIVE
NITRITE: NEGATIVE
PH: 6 (ref 5.0–8.0)
Specific Gravity, Urine: 1.02 (ref 1.000–1.030)
TOTAL PROTEIN, URINE-UPE24: NEGATIVE
URINE GLUCOSE: NEGATIVE
UROBILINOGEN UA: 0.2 (ref 0.0–1.0)

## 2017-08-15 LAB — HEMOGLOBIN A1C: Hgb A1c MFr Bld: 6.3 % (ref 4.6–6.5)

## 2017-08-15 LAB — LIPID PANEL
Cholesterol: 150 mg/dL (ref 0–200)
HDL: 55.5 mg/dL (ref >39.00)
LDL CALC: 81 mg/dL (ref 0–99)
NONHDL: 94.75
Total CHOL/HDL Ratio: 3
Triglycerides: 67 mg/dL (ref 0.0–149.0)
VLDL: 13.4 mg/dL (ref 0.0–40.0)

## 2017-08-15 LAB — CBC WITH DIFFERENTIAL/PLATELET
BASOS PCT: 0.9 % (ref 0.0–3.0)
Basophils Absolute: 0.1 10*3/uL (ref 0.0–0.1)
EOS PCT: 4.2 % (ref 0.0–5.0)
Eosinophils Absolute: 0.3 10*3/uL (ref 0.0–0.7)
HCT: 46.7 % (ref 39.0–52.0)
HEMOGLOBIN: 15.4 g/dL (ref 13.0–17.0)
LYMPHS ABS: 2 10*3/uL (ref 0.7–4.0)
Lymphocytes Relative: 31.6 % (ref 12.0–46.0)
MCHC: 33.1 g/dL (ref 30.0–36.0)
MCV: 92.3 fl (ref 78.0–100.0)
MONOS PCT: 12.7 % — AB (ref 3.0–12.0)
Monocytes Absolute: 0.8 10*3/uL (ref 0.1–1.0)
NEUTROS PCT: 50.6 % (ref 43.0–77.0)
Neutro Abs: 3.2 10*3/uL (ref 1.4–7.7)
Platelets: 242 10*3/uL (ref 150.0–400.0)
RBC: 5.06 Mil/uL (ref 4.22–5.81)
RDW: 13.6 % (ref 11.5–15.5)
WBC: 6.3 10*3/uL (ref 4.0–10.5)

## 2017-08-15 LAB — PSA: PSA: 0.62 ng/mL (ref 0.10–4.00)

## 2017-08-15 NOTE — Progress Notes (Signed)
Subjective:  Patient ID: Don Johnson, male    DOB: [birth year]  Age: 76 y.o. MRN: [id number]  CC: Annual Exam and Hyperlipidemia   HPI Don Johnson presents for a CPX.  He complains of chronic left shoulder pain but otherwise does not experience myopathy or arthropathies.  He tells me that he is seeing an orthopedic surgeon and is considering having surgery soon.  He occasionally takes Advil for the pain.  He is very active and denies any recent episodes of chest pain, shortness of breath, palpitations, edema, or fatigue.  Past Medical History:  Diagnosis Date  . Bilateral recurrent inguinal hernia   . BPH with obstruction/lower urinary tract symptoms   . Cough 09/20/2016  . DDD (degenerative disc disease), cervical   . Diverticulosis of colon   . GERD (gastroesophageal reflux disease)   . Hiatal hernia   . History of adenomatous polyp of colon    2006 tubular adenoma;  2010 tubular adenoma and hyperplastic  . Hyperlipidemia   . OA (osteoarthritis)    hands  . Organic sexual dysfunction   . Pre-diabetes   . S/P dilatation of esophageal stricture    multiple times --  last time w/ balloon 12-31-2015  . Wears dentures    upper denture, lower partial  . Wears glasses    Past Surgical History:  Procedure Laterality Date  . COLONOSCOPY  last one 03-14-2011  . ESOPHAGOGASTRODUODENOSCOPY (EGD) WITH ESOPHAGEAL DILATION  last one 12-31-2015  . HEMORROIDECTOMY  1970's  . INGUINAL HERNIA REPAIR Bilateral 1980's  . PENILE PROSTHESIS IMPLANT N/A 09/29/2016   Procedure: PENILE PROTHESIS INFLATABLE;  Surgeon: Carolan Clines, MD;  Location: [facility];  Service: Urology;  Laterality: N/A;  . UMBILICAL HERNIA REPAIR  2007 approx    reports that  has never smoked. he has never used smokeless tobacco. He reports that he does not drink alcohol or use drugs. family history includes Arthritis in his mother; COPD in his brother; Colon polyps in his mother; Diabetes in  his mother and sister; Hyperlipidemia in his mother; Hypertension in his mother; Leukemia in his brother. Not on File  Outpatient Medications Prior to Visit  Medication Sig Dispense Refill  . aspirin EC 81 MG tablet Take 81 mg by mouth. Take 2 tablets daily    . atorvastatin (LIPITOR) 40 MG tablet Take 1 tablet (40 mg total) by mouth daily. (Patient taking differently: Take 40 mg by mouth every morning. ) 90 tablet 3  . Multiple Vitamins-Minerals (CENTRUM SILVER PO) Take by mouth daily.    Marland Kitchen omeprazole (PRILOSEC) 40 MG capsule TAKE 1 CAPSULE EVERY DAY 30 capsule 6  . diphenhydrAMINE (BENADRYL) 25 mg capsule Take 1 capsule (25 mg total) by mouth every 8 (eight) hours as needed for itching. (Patient not taking: Reported on 04/12/2017) 20 capsule 0  . guaiFENesin-codeine (ROBITUSSIN AC) 100-10 MG/5ML syrup Take 10 mLs by mouth 4 (four) times daily as needed for cough. (Patient not taking: Reported on 04/12/2017) 180 mL 1  . oxyCODONE-acetaminophen (ROXICET) 5-325 MG tablet Take 1-2 tablets by mouth every 4 (four) hours as needed for severe pain. (Patient not taking: Reported on 04/12/2017) 30 tablet 0  . Psyllium (METAMUCIL PO) Take 1 scoop by mouth 2 (two) times daily.    Marland Kitchen triamcinolone ointment (KENALOG) 0.1 % Apply 1 application topically 2 (two) times daily. (Patient not taking: Reported on 04/12/2017) 80 g 0  . 0.9 %  sodium chloride infusion  No facility-administered medications prior to visit.     ROS Review of Systems  Constitutional: Negative.  Negative for diaphoresis, fatigue and unexpected weight change.  HENT: Negative.   Eyes: Negative for visual disturbance.  Respiratory: Negative for cough, chest tightness, shortness of breath and wheezing.   Cardiovascular: Negative for chest pain, palpitations and leg swelling.  Gastrointestinal: Negative for abdominal pain, constipation, diarrhea, nausea and vomiting.  Endocrine: Negative.   Genitourinary: Negative.  Negative for difficulty  urinating, dysuria, penile pain, penile swelling, scrotal swelling, testicular pain and urgency.       +nocturia, 2-3 times per night  Musculoskeletal: Positive for arthralgias. Negative for back pain, myalgias and neck pain.  Skin: Negative.   Allergic/Immunologic: Negative.   Neurological: Negative.  Negative for dizziness.  Hematological: Negative for adenopathy. Does not bruise/bleed easily.  Psychiatric/Behavioral: Negative.     Objective:  BP 140/84 (BP Location: Left Arm, Patient Position: Sitting, Cuff Size: Normal)   Pulse (!) 55   Temp 98.2 F (36.8 C) (Oral)   Ht 5' 6.5" (1.689 m)   Wt 146 lb (66.2 kg)   SpO2 99%   BMI 23.21 kg/m   BP Readings from Last 3 Encounters:  08/15/17 140/84  05/01/17 135/75  10/05/16 126/68    Wt Readings from Last 3 Encounters:  08/15/17 146 lb (66.2 kg)  05/01/17 146 lb (66.2 kg)  04/12/17 146 lb 6.4 oz (66.4 kg)    Physical Exam  Constitutional: He is oriented to person, place, and time. No distress.  HENT:  Mouth/Throat: Oropharynx is clear and moist. No oropharyngeal exudate.  Eyes: Conjunctivae are normal. Right eye exhibits no discharge. Left eye exhibits no discharge. No scleral icterus.  Neck: Normal range of motion. Neck supple. No JVD present. No thyromegaly present.  Cardiovascular: Normal rate, regular rhythm and intact distal pulses. Exam reveals no gallop.  No murmur heard. Pulmonary/Chest: Breath sounds normal. No respiratory distress. He has no rales.  Abdominal: Soft. Bowel sounds are normal. He exhibits no mass. There is no tenderness. There is no guarding. Hernia confirmed negative in the right inguinal area and confirmed negative in the left inguinal area.  Genitourinary: Rectum normal, testes normal and penis normal. Rectal exam shows no external hemorrhoid, no internal hemorrhoid, no fissure, no mass, no tenderness, anal tone normal and guaiac negative stool. Prostate is enlarged (1+ smooth symm BPH). Prostate is  not tender. Right testis shows no mass, no swelling and no tenderness. Right testis is descended. Left testis shows no mass, no swelling and no tenderness. Left testis is descended. Uncircumcised. No phimosis, paraphimosis, hypospadias, penile erythema or penile tenderness. No discharge found.  Musculoskeletal: Normal range of motion. He exhibits no edema, tenderness or deformity.  Lymphadenopathy:    He has no cervical adenopathy.       Right: No inguinal adenopathy present.       Left: No inguinal adenopathy present.  Neurological: He is alert and oriented to person, place, and time.  Skin: Skin is warm and dry. No rash noted. He is not diaphoretic. No erythema. No pallor.  Vitals reviewed.   Lab Results  Component Value Date   WBC 6.3 08/15/2017   HGB 15.4 08/15/2017   HCT 46.7 08/15/2017   PLT 242.0 08/15/2017   GLUCOSE 104 (H) 08/15/2017   CHOL 150 08/15/2017   TRIG 67.0 08/15/2017   HDL 55.50 08/15/2017   LDLDIRECT 149.1 06/30/2013   LDLCALC 81 08/15/2017   ALT 21 08/15/2017   AST 22  08/15/2017   NA 141 08/15/2017   K 4.6 08/15/2017   CL 103 08/15/2017   CREATININE 0.96 08/15/2017   BUN 20 08/15/2017   CO2 31 08/15/2017   TSH 2.50 07/31/2016   PSA 0.62 08/15/2017   HGBA1C 6.3 08/15/2017    Dg Chest 2 View  Result Date: 09/20/2016 CLINICAL DATA:  Sore throat and cough EXAM: CHEST  2 VIEW COMPARISON:  June 04, 2013 FINDINGS: There is no edema or consolidation. The heart size and pulmonary vascularity are normal. No adenopathy. There is a small hiatal hernia. There is atherosclerotic calcification in the aorta. No bone lesions. IMPRESSION: No edema or consolidation. Aortic atherosclerosis. Small hiatal hernia. Electronically Signed   By: Lowella Grip III M.D.   On: 09/20/2016 16:01    Assessment & Plan:   Desmen was seen today for annual exam and hyperlipidemia.  Diagnoses and all orders for this visit:  BPH associated with nocturia- His PSA remains low  which is reassuring that he does not have prostate cancer.  He has no symptoms that need to be treated. -     PSA; Future -     Urinalysis, Routine w reflex microscopic; Future  Hyperlipidemia with target LDL less than 130- He has achieved his LDL goal and is doing well on the statin. -     CBC with Differential/Platelet; Future -     Lipid panel; Future  Prediabetes- His A1c is at 6.3%.  Medical therapy is not indicated.  I will continue to monitor his blood sugars. -     Comprehensive metabolic panel; Future -     Hemoglobin A1c; Future  Routine general medical examination at a health care facility   I have discontinued [name]. Hoctor's Psyllium (METAMUCIL PO), guaiFENesin-codeine, oxyCODONE-acetaminophen, triamcinolone ointment, and diphenhydrAMINE. I am also having him maintain his atorvastatin, omeprazole, aspirin EC, and Multiple Vitamins-Minerals (CENTRUM SILVER PO). We will stop administering sodium chloride.  No orders of the defined types were placed in this encounter.  See AVS for instructions about healthy living and anticipatory guidance.  Follow-up: Return if symptoms worsen or fail to improve.  Scarlette Calico, MD

## 2017-08-15 NOTE — Patient Instructions (Signed)

## 2017-08-21 NOTE — Assessment & Plan Note (Signed)

## 2017-08-22 DIAGNOSIS — M75122 Complete rotator cuff tear or rupture of left shoulder, not specified as traumatic: Secondary | ICD-10-CM | POA: Diagnosis not present

## 2017-09-25 DIAGNOSIS — M7522 Bicipital tendinitis, left shoulder: Secondary | ICD-10-CM | POA: Diagnosis not present

## 2017-09-25 DIAGNOSIS — S43432A Superior glenoid labrum lesion of left shoulder, initial encounter: Secondary | ICD-10-CM | POA: Diagnosis not present

## 2017-09-25 DIAGNOSIS — S46012A Strain of muscle(s) and tendon(s) of the rotator cuff of left shoulder, initial encounter: Secondary | ICD-10-CM | POA: Diagnosis not present

## 2017-09-25 DIAGNOSIS — G8918 Other acute postprocedural pain: Secondary | ICD-10-CM | POA: Diagnosis not present

## 2017-09-25 DIAGNOSIS — S46092A Other injury of muscle(s) and tendon(s) of the rotator cuff of left shoulder, initial encounter: Secondary | ICD-10-CM | POA: Diagnosis not present

## 2017-09-25 DIAGNOSIS — M659 Synovitis and tenosynovitis, unspecified: Secondary | ICD-10-CM | POA: Diagnosis not present

## 2017-09-25 DIAGNOSIS — Y999 Unspecified external cause status: Secondary | ICD-10-CM | POA: Diagnosis not present

## 2017-09-25 DIAGNOSIS — X58XXXA Exposure to other specified factors, initial encounter: Secondary | ICD-10-CM | POA: Diagnosis not present

## 2017-09-25 DIAGNOSIS — M94212 Chondromalacia, left shoulder: Secondary | ICD-10-CM | POA: Diagnosis not present

## 2017-10-02 ENCOUNTER — Other Ambulatory Visit: Payer: Self-pay | Admitting: Internal Medicine

## 2017-10-04 DIAGNOSIS — M75120 Complete rotator cuff tear or rupture of unspecified shoulder, not specified as traumatic: Secondary | ICD-10-CM | POA: Insufficient documentation

## 2017-10-15 DIAGNOSIS — H40002 Preglaucoma, unspecified, left eye: Secondary | ICD-10-CM | POA: Diagnosis not present

## 2017-10-25 ENCOUNTER — Other Ambulatory Visit: Payer: Self-pay | Admitting: Internal Medicine

## 2017-10-25 DIAGNOSIS — E785 Hyperlipidemia, unspecified: Secondary | ICD-10-CM

## 2017-11-06 ENCOUNTER — Encounter: Payer: Self-pay | Admitting: Physical Therapy

## 2017-11-06 ENCOUNTER — Ambulatory Visit: Payer: Medicare Other | Attending: Orthopedic Surgery | Admitting: Physical Therapy

## 2017-11-06 ENCOUNTER — Other Ambulatory Visit: Payer: Self-pay

## 2017-11-06 DIAGNOSIS — M25612 Stiffness of left shoulder, not elsewhere classified: Secondary | ICD-10-CM | POA: Diagnosis not present

## 2017-11-06 DIAGNOSIS — M25512 Pain in left shoulder: Secondary | ICD-10-CM | POA: Insufficient documentation

## 2017-11-06 DIAGNOSIS — G8929 Other chronic pain: Secondary | ICD-10-CM

## 2017-11-06 NOTE — Therapy (Signed)
[facility] [place], [place], [address] Phone: [phone]   Fax:  [phone]  Physical Therapy Evaluation  Patient Details  Name: Don Johnson MRN: [id number] Date of Birth: [birth year] Referring Provider: Bard Herbert MD   Encounter Date: 11/06/2017  PT End of Session - 11/06/17 1428    Visit Number  1    Number of Visits  12    Date for PT Re-Evaluation  12/04/17    Authorization Type  FOTO AT LEAST EVERY 5TH VISIT.    PT Start Time  0145    PT Stop Time  0227    PT Time Calculation (min)  42 min       Past Medical History:  Diagnosis Date  . Bilateral recurrent inguinal hernia   . BPH with obstruction/lower urinary tract symptoms   . Cough 09/20/2016  . DDD (degenerative disc disease), cervical   . Diverticulosis of colon   . GERD (gastroesophageal reflux disease)   . Hiatal hernia   . History of adenomatous polyp of colon    2006 tubular adenoma;  2010 tubular adenoma and hyperplastic  . Hyperlipidemia   . OA (osteoarthritis)    hands  . Organic sexual dysfunction   . Pre-diabetes   . S/P dilatation of esophageal stricture    multiple times --  last time w/ balloon 12-31-2015  . Wears dentures    upper denture, lower partial  . Wears glasses     Past Surgical History:  Procedure Laterality Date  . COLONOSCOPY  last one 03-14-2011  . ESOPHAGOGASTRODUODENOSCOPY (EGD) WITH ESOPHAGEAL DILATION  last one 12-31-2015  . HEMORROIDECTOMY  1970's  . INGUINAL HERNIA REPAIR Bilateral 1980's  . PENILE PROSTHESIS IMPLANT N/A 09/29/2016   Procedure: PENILE PROTHESIS INFLATABLE;  Surgeon: Carolan Clines, MD;  Location: [facility];  Service: Urology;  Laterality: N/A;  . UMBILICAL HERNIA REPAIR  2007 approx    There were no vitals filed for this visit.   Subjective Assessment - 11/06/17 1416    Subjective  The patient underwent a left RTC repair for a full thickness tear performed on  09/25/17.  He has been complaint to sling usage but did not have it upon presentation to the clinic today.  His pain at rest is a 4/10 and higher when out of sling.  Overall he is pleased thus far.  He does reports some left hand swelling.  he has been squeezing a ball at home.    Pertinent History  Cervical disc disease; OA.    Patient Stated Goals  Use left arm again.    Currently in Pain?  Yes    Pain Score  4     Pain Location  Shoulder    Pain Orientation  Left    Pain Descriptors / Indicators  Aching;Dull    Pain Type  Surgical pain    Pain Onset  More than a month ago    Pain Frequency  Constant    Aggravating Factors   See above.    Pain Relieving Factors  See above.         [facility] PT Assessment - 11/06/17 0001      Assessment   Medical Diagnosis  left shoulder full thickness tear.    Referring Provider  Bard Herbert MD    Onset Date/Surgical Date  -- 09/25/17(surgery date).      Precautions   Precautions  -- PER PROTOCOL.      Restrictions  Weight Bearing Restrictions  No      Balance Screen   Has the patient fallen in the past 6 months  Yes    How many times?  -- 1.    Has the patient had a decrease in activity level because of a fear of falling?   Yes    Is the patient reluctant to leave their home because of a fear of falling?   No      Home Environment   Living Environment  Private residence      Prior Function   Level of Independence  Independent      Observation/Other Assessments   Observations  Left shoulder incision sites look good.    Focus on Therapeutic Outcomes (FOTO)   96% limitation.      Posture/Postural Control   Posture/Postural Control  -- Guarded.      ROM / Strength   AROM / PROM / Strength  PROM      PROM   Overall PROM Comments  In supine:  Passive left shoulder flexion= 38 degrees and ER= 13 degrees.      Palpation   Palpation comment  Mild palpable tenderness diffusely around left shoulder.      Ambulation/Gait   Gait Comments   WNL.             Objective measurements completed on examination: See above findings.      [facility] Adult PT Treatment/Exercise - 11/06/17 0001      Modalities   Modalities  Electrical Stimulation;Vasopneumatic      Electrical Stimulation   Electrical Stimulation Location  Left shoulder.    Electrical Stimulation Action  IFC    Electrical Stimulation Parameters  80-150 Hz at 100% scan x 15 minutes low-level (non-motoric).    Electrical Stimulation Goals  Pain      Vasopneumatic   Number Minutes Vasopneumatic   15 minutes    Vasopnuematic Location   -- Left shoulder. Folded pillow between left elbow and thorax.    Vasopneumatic Pressure  Low             PT Education - 11/06/17 1431    Education provided  Yes    Education Details  HEP.    Person(s) Educated  Patient    Methods  Explanation;Demonstration;Tactile cues;Verbal cues;Handout    Comprehension  Verbalized understanding;Returned demonstration;Verbal cues required;Tactile cues required;Need further instruction       PT Short Term Goals - 11/06/17 1508      PT SHORT TERM GOAL #1   Title  STG's=LTG's.        PT Long Term Goals - 11/06/17 1508      PT LONG TERM GOAL #1   Title  Independent with a HEP.    Time  4    Period  Weeks    Status  New      PT LONG TERM GOAL #2   Title  Active left shoulder flexion to 145 degrees so the patient can easily reach overhead.    Time  4    Period  Weeks    Status  New      PT LONG TERM GOAL #3   Title  Active ER to 70 degrees+ to allow for easily donning/doffing of apparel.    Time  4    Period  Weeks    Status  New      PT LONG TERM GOAL #4   Title  Increase ROM so patient is able to reach  behind back.    Time  4    Period  Weeks    Status  New      PT LONG TERM GOAL #5   Title  Increase left shoulder strength to a solid 4+/5 to increase stability for performance of functional activities.    Time  4    Period  Weeks    Status  New      PT LONG  TERM GOAL #6   Title  Perform ADL's with pain not > 3/10.             Plan - 11/06/17 1504    Clinical Impression Statement  The patient is s/p left shoulder RTC repair performed on 09/25/17.  As expected he has significant losses of left shoulder range of motion.  He is highly limited functionally with his left UE at this time was a FOTO score of 96%(limitation).  Patient will benefit from skilled physical therapy intervention per protocol.    Clinical Presentation  Stable    Clinical Presentation due to:  Good surgical outcome.    Clinical Decision Making  Low    Rehab Potential  Excellent    PT Frequency  3x / week    PT Duration  4 weeks    PT Treatment/Interventions  ADLs/Self Care Home Management;Cryotherapy;Electrical Stimulation;Ultrasound;Moist Heat;Therapeutic activities;Therapeutic exercise;Patient/family education;Neuromuscular re-education;Manual techniques;Passive range of motion;Vasopneumatic Device    PT Next Visit Plan  Begin with PROM to patient's left shoulder.  Low vasopneumatic and low-level e'stim.  Pillow between elbow and thorax while on vasopneumatic.    Consulted and Agree with Plan of Care  Patient       Patient will benefit from skilled therapeutic intervention in order to improve the following deficits and impairments:  Decreased activity tolerance, Decreased range of motion, Pain  Visit Diagnosis: Chronic left shoulder pain - Plan: PT plan of care cert/re-cert  Stiffness of left shoulder, not elsewhere classified - Plan: PT plan of care cert/re-cert     Problem List Patient Active Problem List   Diagnosis Date Noted  . Chronic night sweats 07/31/2016  . DDD (degenerative disc disease), cervical 07/29/2015  . Prediabetes 06/30/2013  . Erectile dysfunction 06/30/2013  . Routine general medical examination at a health care facility 04/12/2011  . ESOPHAGEAL STRICTURE 12/14/2008  . Gastritis and gastroduodenitis 12/14/2008  . BPH associated with  nocturia 12/03/2008  . Osteoarthrosis, hand 12/03/2008  . Hyperlipidemia with target LDL less than 130 09/28/2008    Don Johnson, Don Johnson 11/06/2017, 3:12 PM  [facility] [place], [place], [address] Phone: [phone]   Fax:  [phone]  Name: Don Johnson MRN: [id number] Date of Birth: [birth year]

## 2017-11-08 ENCOUNTER — Encounter: Payer: Self-pay | Admitting: Physical Therapy

## 2017-11-08 ENCOUNTER — Ambulatory Visit: Payer: Medicare Other | Admitting: Physical Therapy

## 2017-11-08 DIAGNOSIS — G8929 Other chronic pain: Secondary | ICD-10-CM | POA: Diagnosis not present

## 2017-11-08 DIAGNOSIS — M25512 Pain in left shoulder: Secondary | ICD-10-CM | POA: Diagnosis not present

## 2017-11-08 DIAGNOSIS — M25612 Stiffness of left shoulder, not elsewhere classified: Secondary | ICD-10-CM

## 2017-11-08 NOTE — Therapy (Signed)
[facility] [place], [place], [address] Phone: [phone]   Fax:  [phone]  Physical Therapy Treatment  Patient Details  Name: Don Johnson MRN: [id number] Date of Birth: 15-Aug-1941 Referring Provider: Bard Herbert MD   Encounter Date: 11/08/2017  PT End of Session - 11/08/17 0817    Visit Number  2    Number of Visits  12    Date for PT Re-Evaluation  12/04/17    Authorization Type  FOTO AT LEAST EVERY 5TH VISIT.    PT Start Time  [phone]    PT Stop Time  0901    PT Time Calculation (min)  44 min    Activity Tolerance  Patient tolerated treatment well    Behavior During Therapy  [facility] for tasks assessed/performed       Past Medical History:  Diagnosis Date  . Bilateral recurrent inguinal hernia   . BPH with obstruction/lower urinary tract symptoms   . Cough 09/20/2016  . DDD (degenerative disc disease), cervical   . Diverticulosis of colon   . GERD (gastroesophageal reflux disease)   . Hiatal hernia   . History of adenomatous polyp of colon    2006 tubular adenoma;  2010 tubular adenoma and hyperplastic  . Hyperlipidemia   . OA (osteoarthritis)    hands  . Organic sexual dysfunction   . Pre-diabetes   . S/P dilatation of esophageal stricture    multiple times --  last time w/ balloon 12-31-2015  . Wears dentures    upper denture, lower partial  . Wears glasses     Past Surgical History:  Procedure Laterality Date  . COLONOSCOPY  last one 03-14-2011  . ESOPHAGOGASTRODUODENOSCOPY (EGD) WITH ESOPHAGEAL DILATION  last one 12-31-2015  . HEMORROIDECTOMY  1970's  . INGUINAL HERNIA REPAIR Bilateral 1980's  . PENILE PROSTHESIS IMPLANT N/A 09/29/2016   Procedure: PENILE PROTHESIS INFLATABLE;  Surgeon: Carolan Clines, MD;  Location: [facility];  Service: Urology;  Laterality: N/A;  . UMBILICAL HERNIA REPAIR  2007 approx    There were no vitals filed for this visit.  Subjective Assessment  - 11/08/17 0817    Subjective  Reports a little soreness in L elbow and posterior shoulder.    Pertinent History  Cervical disc disease; OA.    Patient Stated Goals  Use left arm again.    Currently in Pain?  Yes    Pain Score  2     Pain Location  Shoulder    Pain Orientation  Left;Posterior    Pain Descriptors / Indicators  Sore    Pain Type  Surgical pain    Pain Onset  More than a month ago         [facility] PT Assessment - 11/08/17 0001      Assessment   Medical Diagnosis  left shoulder full thickness tear.    Onset Date/Surgical Date  09/25/17      Restrictions   Weight Bearing Restrictions  No                  [facility] Adult PT Treatment/Exercise - 11/08/17 0001      Modalities   Modalities  Electrical Stimulation;Vasopneumatic      Electrical Stimulation   Electrical Stimulation Location  L shoulder    Electrical Stimulation Action  IFC    Electrical Stimulation Parameters  1-10 hz x15 min    Electrical Stimulation Goals  Pain;Edema      Vasopneumatic  Number Minutes Vasopneumatic   15 minutes    Vasopnuematic Location   Shoulder    Vasopneumatic Pressure  Medium    Vasopneumatic Temperature   34      Manual Therapy   Manual Therapy  Passive ROM    Passive ROM  PROM of L shoulder into flexion, ER/IR with gentle holds at end range with gentle oscillations to promote relaxation               PT Short Term Goals - 11/06/17 1508      PT SHORT TERM GOAL #1   Title  STG's=LTG's.        PT Long Term Goals - 11/06/17 1508      PT LONG TERM GOAL #1   Title  Independent with a HEP.    Time  4    Period  Weeks    Status  New      PT LONG TERM GOAL #2   Title  Active left shoulder flexion to 145 degrees so the patient can easily reach overhead.    Time  4    Period  Weeks    Status  New      PT LONG TERM GOAL #3   Title  Active ER to 70 degrees+ to allow for easily donning/doffing of apparel.    Time  4    Period  Weeks    Status  New       PT LONG TERM GOAL #4   Title  Increase ROM so patient is able to reach behind back.    Time  4    Period  Weeks    Status  New      PT LONG TERM GOAL #5   Title  Increase left shoulder strength to a solid 4+/5 to increase stability for performance of functional activities.    Time  4    Period  Weeks    Status  New      PT LONG TERM GOAL #6   Title  Perform ADL's with pain not > 3/10.            Plan - 11/08/17 0856    Clinical Impression Statement  Patient tolerated today's treatment well as he arrived with low level L shoulder pain. Very firm end feels noted with all directions of PROM of L shoulder and very gentle oscillations completed to promote relaxation. Patient also further educated regarding HEP instructions provided in evaluation to reinforce proper ROM. Normal modalities response noted following removal of the modalities. Patient did not report any increased pain during treatment today.    Rehab Potential  Excellent    PT Frequency  3x / week    PT Duration  4 weeks    PT Treatment/Interventions  ADLs/Self Care Home Management;Cryotherapy;Electrical Stimulation;Ultrasound;Moist Heat;Therapeutic activities;Therapeutic exercise;Patient/family education;Neuromuscular re-education;Manual techniques;Passive range of motion;Vasopneumatic Device    PT Next Visit Plan  Begin with PROM to patient's left shoulder.  Low vasopneumatic and low-level e'stim.  Pillow between elbow and thorax while on vasopneumatic.    Consulted and Agree with Plan of Care  Patient       Patient will benefit from skilled therapeutic intervention in order to improve the following deficits and impairments:  Decreased activity tolerance, Decreased range of motion, Pain  Visit Diagnosis: Chronic left shoulder pain  Stiffness of left shoulder, not elsewhere classified     Problem List Patient Active Problem List   Diagnosis Date Noted  . Chronic night sweats  07/31/2016  . DDD  (degenerative disc disease), cervical 07/29/2015  . Prediabetes 06/30/2013  . Erectile dysfunction 06/30/2013  . Routine general medical examination at a health care facility 04/12/2011  . ESOPHAGEAL STRICTURE 12/14/2008  . Gastritis and gastroduodenitis 12/14/2008  . BPH associated with nocturia 12/03/2008  . Osteoarthrosis, hand 12/03/2008  . Hyperlipidemia with target LDL less than 130 09/28/2008    Standley Brooking, PTA 11/08/2017, 9:11 AM  [facility] [place], [place], [address] Phone: [phone]   Fax:  [phone]  Name: Don Johnson MRN: [id number] Date of Birth: 1941/09/01

## 2017-11-13 ENCOUNTER — Ambulatory Visit: Payer: Medicare Other | Attending: Orthopedic Surgery | Admitting: Physical Therapy

## 2017-11-13 ENCOUNTER — Encounter: Payer: Self-pay | Admitting: Physical Therapy

## 2017-11-13 DIAGNOSIS — M25612 Stiffness of left shoulder, not elsewhere classified: Secondary | ICD-10-CM | POA: Diagnosis not present

## 2017-11-13 DIAGNOSIS — G8929 Other chronic pain: Secondary | ICD-10-CM | POA: Diagnosis not present

## 2017-11-13 DIAGNOSIS — M25512 Pain in left shoulder: Secondary | ICD-10-CM | POA: Insufficient documentation

## 2017-11-13 NOTE — Therapy (Signed)
[facility] [place], [place], [address] Phone: [phone]   Fax:  [phone]  Physical Therapy Treatment  Patient Details  Name: Don Johnson MRN: [id number] Date of Birth: 02-28-1941 Referring Provider: Bard Herbert MD   Encounter Date: 11/13/2017  PT End of Session - 11/13/17 0818    Visit Number  3    Number of Visits  12    Date for PT Re-Evaluation  12/04/17    Authorization Type  FOTO AT LEAST EVERY 5TH VISIT.    PT Start Time  0818    PT Stop Time  0903    PT Time Calculation (min)  45 min    Activity Tolerance  Patient tolerated treatment well    Behavior During Therapy  [facility] for tasks assessed/performed       Past Medical History:  Diagnosis Date  . Bilateral recurrent inguinal hernia   . BPH with obstruction/lower urinary tract symptoms   . Cough 09/20/2016  . DDD (degenerative disc disease), cervical   . Diverticulosis of colon   . GERD (gastroesophageal reflux disease)   . Hiatal hernia   . History of adenomatous polyp of colon    2006 tubular adenoma;  2010 tubular adenoma and hyperplastic  . Hyperlipidemia   . OA (osteoarthritis)    hands  . Organic sexual dysfunction   . Pre-diabetes   . S/P dilatation of esophageal stricture    multiple times --  last time w/ balloon 12-31-2015  . Wears dentures    upper denture, lower partial  . Wears glasses     Past Surgical History:  Procedure Laterality Date  . COLONOSCOPY  last one 03-14-2011  . ESOPHAGOGASTRODUODENOSCOPY (EGD) WITH ESOPHAGEAL DILATION  last one 12-31-2015  . HEMORROIDECTOMY  1970's  . INGUINAL HERNIA REPAIR Bilateral 1980's  . PENILE PROSTHESIS IMPLANT N/A 09/29/2016   Procedure: PENILE PROTHESIS INFLATABLE;  Surgeon: Carolan Clines, MD;  Location: [facility];  Service: Urology;  Laterality: N/A;  . UMBILICAL HERNIA REPAIR  2007 approx    There were no vitals filed for this visit.  Subjective Assessment  - 11/13/17 0817    Subjective  Reports that he is a little sore along L deltoid attachment. Reports he doesn't really know what to expect.     Pertinent History  Cervical disc disease; OA.    Patient Stated Goals  Use left arm again.    Currently in Pain?  Yes    Pain Score  3     Pain Location  Shoulder    Pain Orientation  Left;Lateral    Pain Descriptors / Indicators  Discomfort    Pain Type  Surgical pain    Pain Onset  More than a month ago         [facility] PT Assessment - 11/13/17 0001      Assessment   Medical Diagnosis  left shoulder full thickness tear.    Onset Date/Surgical Date  09/25/17    Next MD Visit  12/03/2017      Restrictions   Weight Bearing Restrictions  No                  [facility] Adult PT Treatment/Exercise - 11/13/17 0001      Modalities   Modalities  Electrical Stimulation;Vasopneumatic      Electrical Stimulation   Electrical Stimulation Location  L shoulder    Electrical Stimulation Action  IFC    Electrical Stimulation Parameters  1-10 hz  x15 min    Electrical Stimulation Goals  Pain;Edema      Vasopneumatic   Number Minutes Vasopneumatic   15 minutes    Vasopnuematic Location   Shoulder    Vasopneumatic Pressure  Low    Vasopneumatic Temperature   34      Manual Therapy   Manual Therapy  Passive ROM    Passive ROM  PROM of L shoulder into flexion, ER/IR with gentle holds at end range with gentle oscillations to promote relaxation               PT Short Term Goals - 11/06/17 1508      PT SHORT TERM GOAL #1   Title  STG's=LTG's.        PT Long Term Goals - 11/06/17 1508      PT LONG TERM GOAL #1   Title  Independent with a HEP.    Time  4    Period  Weeks    Status  New      PT LONG TERM GOAL #2   Title  Active left shoulder flexion to 145 degrees so the patient can easily reach overhead.    Time  4    Period  Weeks    Status  New      PT LONG TERM GOAL #3   Title  Active ER to 70 degrees+ to allow for  easily donning/doffing of apparel.    Time  4    Period  Weeks    Status  New      PT LONG TERM GOAL #4   Title  Increase ROM so patient is able to reach behind back.    Time  4    Period  Weeks    Status  New      PT LONG TERM GOAL #5   Title  Increase left shoulder strength to a solid 4+/5 to increase stability for performance of functional activities.    Time  4    Period  Weeks    Status  New      PT LONG TERM GOAL #6   Title  Perform ADL's with pain not > 3/10.            Plan - 11/13/17 0850    Clinical Impression Statement  Patient tolerated today's treatment fairly well as he arrived with increased L soreness. L deltoid region palpated wtih increased tone and edema present. Very gentle PROM of L shoulder completed into flexion and ER/IR with intermittant oscilllations to promote relaxation of the L shoulder. Patient again educated regarding proper form of the AAROM L shoulder ER from HEP to ensure proper form. Normal modalities response noted following removal of the modalities. Patient educated that with the L shoulder edema present use of ice may be warranted if patient desires.    Rehab Potential  Excellent    PT Frequency  3x / week    PT Duration  4 weeks    PT Treatment/Interventions  ADLs/Self Care Home Management;Cryotherapy;Electrical Stimulation;Ultrasound;Moist Heat;Therapeutic activities;Therapeutic exercise;Patient/family education;Neuromuscular re-education;Manual techniques;Passive range of motion;Vasopneumatic Device    PT Next Visit Plan  Begin with PROM to patient's left shoulder.  Low vasopneumatic and low-level e'stim.  Pillow between elbow and thorax while on vasopneumatic.    Consulted and Agree with Plan of Care  Patient       Patient will benefit from skilled therapeutic intervention in order to improve the following deficits and impairments:  Decreased activity tolerance, Decreased  range of motion, Pain  Visit Diagnosis: Chronic left shoulder  pain  Stiffness of left shoulder, not elsewhere classified     Problem List Patient Active Problem List   Diagnosis Date Noted  . Chronic night sweats 07/31/2016  . DDD (degenerative disc disease), cervical 07/29/2015  . Prediabetes 06/30/2013  . Erectile dysfunction 06/30/2013  . Routine general medical examination at a health care facility 04/12/2011  . ESOPHAGEAL STRICTURE 12/14/2008  . Gastritis and gastroduodenitis 12/14/2008  . BPH associated with nocturia 12/03/2008  . Osteoarthrosis, hand 12/03/2008  . Hyperlipidemia with target LDL less than 130 09/28/2008    Standley Brooking, PTA 11/13/2017, 9:08 AM  [facility] [address] [place], [place], [address] Phone: [phone]   Fax:  [phone]  Name: TRACKER MANCE MRN: [id number] Date of Birth: 05-Nov-1940

## 2017-11-15 ENCOUNTER — Encounter: Payer: Self-pay | Admitting: Physical Therapy

## 2017-11-15 ENCOUNTER — Ambulatory Visit: Payer: Medicare Other | Admitting: Physical Therapy

## 2017-11-15 DIAGNOSIS — M25612 Stiffness of left shoulder, not elsewhere classified: Secondary | ICD-10-CM

## 2017-11-15 DIAGNOSIS — M25512 Pain in left shoulder: Secondary | ICD-10-CM | POA: Diagnosis not present

## 2017-11-15 DIAGNOSIS — G8929 Other chronic pain: Secondary | ICD-10-CM | POA: Diagnosis not present

## 2017-11-15 NOTE — Therapy (Signed)
[facility] [place], [place], [address] Phone: [phone]   Fax:  [phone]  Physical Therapy Treatment  Patient Details  Name: Don Johnson MRN: [id number] Date of Birth: 24-Sep-1940 Referring Provider: Bard Herbert MD   Encounter Date: 11/15/2017  PT End of Session - 11/15/17 0816    Visit Number  4    Number of Visits  12    Date for PT Re-Evaluation  12/04/17    Authorization Type  FOTO AT LEAST EVERY 5TH VISIT.    PT Start Time  [phone]    PT Stop Time  0901    PT Time Calculation (min)  45 min    Activity Tolerance  Patient tolerated treatment well    Behavior During Therapy  [facility] for tasks assessed/performed       Past Medical History:  Diagnosis Date  . Bilateral recurrent inguinal hernia   . BPH with obstruction/lower urinary tract symptoms   . Cough 09/20/2016  . DDD (degenerative disc disease), cervical   . Diverticulosis of colon   . GERD (gastroesophageal reflux disease)   . Hiatal hernia   . History of adenomatous polyp of colon    2006 tubular adenoma;  2010 tubular adenoma and hyperplastic  . Hyperlipidemia   . OA (osteoarthritis)    hands  . Organic sexual dysfunction   . Pre-diabetes   . S/P dilatation of esophageal stricture    multiple times --  last time w/ balloon 12-31-2015  . Wears dentures    upper denture, lower partial  . Wears glasses     Past Surgical History:  Procedure Laterality Date  . COLONOSCOPY  last one 03-14-2011  . ESOPHAGOGASTRODUODENOSCOPY (EGD) WITH ESOPHAGEAL DILATION  last one 12-31-2015  . HEMORROIDECTOMY  1970's  . INGUINAL HERNIA REPAIR Bilateral 1980's  . PENILE PROSTHESIS IMPLANT N/A 09/29/2016   Procedure: PENILE PROTHESIS INFLATABLE;  Surgeon: Carolan Clines, MD;  Location: [facility];  Service: Urology;  Laterality: N/A;  . UMBILICAL HERNIA REPAIR  2007 approx    There were no vitals filed for this visit.  Subjective Assessment  - 11/15/17 0815    Subjective  Reports continued soreness.    Pertinent History  Cervical disc disease; OA.    Patient Stated Goals  Use left arm again.    Currently in Pain?  Yes    Pain Score  3     Pain Location  Shoulder    Pain Orientation  Left    Pain Descriptors / Indicators  Discomfort;Sore    Pain Type  Surgical pain    Pain Onset  More than a month ago    Pain Frequency  Intermittent    Aggravating Factors   Arm placement, putting arm in coat         [facility] PT Assessment - 11/15/17 0001      Assessment   Medical Diagnosis  left shoulder full thickness tear.    Onset Date/Surgical Date  09/25/17    Next MD Visit  12/03/2017      Restrictions   Weight Bearing Restrictions  No                  [facility] Adult PT Treatment/Exercise - 11/15/17 0001      Modalities   Modalities  Electrical Stimulation;Vasopneumatic      Electrical Stimulation   Electrical Stimulation Location  L shoulder    Electrical Stimulation Action  IFC    Electrical Stimulation Parameters  1-10 hz x15 min    Electrical Stimulation Goals  Pain;Edema      Vasopneumatic   Number Minutes Vasopneumatic   15 minutes    Vasopnuematic Location   Shoulder    Vasopneumatic Pressure  Low    Vasopneumatic Temperature   34      Manual Therapy   Manual Therapy  Passive ROM    Passive ROM  PROM of L shoulder into flexion, ER/IR with gentle holds at end range with gentle oscillations to promote relaxation               PT Short Term Goals - 11/06/17 1508      PT SHORT TERM GOAL #1   Title  STG's=LTG's.        PT Long Term Goals - 11/06/17 1508      PT LONG TERM GOAL #1   Title  Independent with a HEP.    Time  4    Period  Weeks    Status  New      PT LONG TERM GOAL #2   Title  Active left shoulder flexion to 145 degrees so the patient can easily reach overhead.    Time  4    Period  Weeks    Status  New      PT LONG TERM GOAL #3   Title  Active ER to 70 degrees+ to  allow for easily donning/doffing of apparel.    Time  4    Period  Weeks    Status  New      PT LONG TERM GOAL #4   Title  Increase ROM so patient is able to reach behind back.    Time  4    Period  Weeks    Status  New      PT LONG TERM GOAL #5   Title  Increase left shoulder strength to a solid 4+/5 to increase stability for performance of functional activities.    Time  4    Period  Weeks    Status  New      PT LONG TERM GOAL #6   Title  Perform ADL's with pain not > 3/10.            Plan - 11/15/17 0848    Clinical Impression Statement  Patient continues to tolerate treatment fairly well although he experienced greater discomfort with PROM of L shoulder into ER/IR. Gentle PROM of L shoulder completed into all directions with intermittant oscillations required to promote relaxation. Muscle guarding and muscle tension palpated in L deltoids as well as lateral pectoralis. Firm end feels and smooth arc of motion noted with all directions of PROM of L shoulder. Normal modalities response noted following removal of the modalities. No sling donned upon arrival.    Rehab Potential  Excellent    PT Frequency  3x / week    PT Duration  4 weeks    PT Treatment/Interventions  ADLs/Self Care Home Management;Cryotherapy;Electrical Stimulation;Ultrasound;Moist Heat;Therapeutic activities;Therapeutic exercise;Patient/family education;Neuromuscular re-education;Manual techniques;Passive range of motion;Vasopneumatic Device    PT Next Visit Plan  Begin with PROM to patient's left shoulder.  Low vasopneumatic and low-level e'stim.  Pillow between elbow and thorax while on vasopneumatic.    Consulted and Agree with Plan of Care  Patient       Patient will benefit from skilled therapeutic intervention in order to improve the following deficits and impairments:  Decreased activity tolerance, Decreased range of motion, Pain  Visit Diagnosis:  Chronic left shoulder pain  Stiffness of left  shoulder, not elsewhere classified     Problem List Patient Active Problem List   Diagnosis Date Noted  . Chronic night sweats 07/31/2016  . DDD (degenerative disc disease), cervical 07/29/2015  . Prediabetes 06/30/2013  . Erectile dysfunction 06/30/2013  . Routine general medical examination at a health care facility 04/12/2011  . ESOPHAGEAL STRICTURE 12/14/2008  . Gastritis and gastroduodenitis 12/14/2008  . BPH associated with nocturia 12/03/2008  . Osteoarthrosis, hand 12/03/2008  . Hyperlipidemia with target LDL less than 130 09/28/2008    Standley Brooking, PTA 11/15/2017, 9:07 AM  [facility] [address] [place], [place], [address] Phone: [phone]   Fax:  [phone]  Name: Don Johnson MRN: [id number] Date of Birth: 1940-12-24

## 2017-11-20 ENCOUNTER — Encounter: Payer: Self-pay | Admitting: Physical Therapy

## 2017-11-20 ENCOUNTER — Ambulatory Visit: Payer: Medicare Other | Admitting: Physical Therapy

## 2017-11-20 DIAGNOSIS — G8929 Other chronic pain: Secondary | ICD-10-CM | POA: Diagnosis not present

## 2017-11-20 DIAGNOSIS — M25512 Pain in left shoulder: Secondary | ICD-10-CM | POA: Diagnosis not present

## 2017-11-20 DIAGNOSIS — M25612 Stiffness of left shoulder, not elsewhere classified: Secondary | ICD-10-CM

## 2017-11-20 NOTE — Therapy (Signed)
[facility] [place], [place], [address] Phone: [phone]   Fax:  [phone]  Physical Therapy Treatment  Patient Details  Name: Don Johnson MRN: [id number] Date of Birth: 08-Nov-1940 Referring Provider: Bard Herbert MD   Encounter Date: 11/20/2017  PT End of Session - 11/20/17 0819    Visit Number  5    Number of Visits  12    Date for PT Re-Evaluation  12/04/17    Authorization Type  FOTO AT LEAST EVERY 5TH VISIT.    PT Start Time  [phone]    Activity Tolerance  Patient tolerated treatment well    Behavior During Therapy  [facility] for tasks assessed/performed       Past Medical History:  Diagnosis Date  . Bilateral recurrent inguinal hernia   . BPH with obstruction/lower urinary tract symptoms   . Cough 09/20/2016  . DDD (degenerative disc disease), cervical   . Diverticulosis of colon   . GERD (gastroesophageal reflux disease)   . Hiatal hernia   . History of adenomatous polyp of colon    2006 tubular adenoma;  2010 tubular adenoma and hyperplastic  . Hyperlipidemia   . OA (osteoarthritis)    hands  . Organic sexual dysfunction   . Pre-diabetes   . S/P dilatation of esophageal stricture    multiple times --  last time w/ balloon 12-31-2015  . Wears dentures    upper denture, lower partial  . Wears glasses     Past Surgical History:  Procedure Laterality Date  . COLONOSCOPY  last one 03-14-2011  . ESOPHAGOGASTRODUODENOSCOPY (EGD) WITH ESOPHAGEAL DILATION  last one 12-31-2015  . HEMORROIDECTOMY  1970's  . INGUINAL HERNIA REPAIR Bilateral 1980's  . PENILE PROSTHESIS IMPLANT N/A 09/29/2016   Procedure: PENILE PROTHESIS INFLATABLE;  Surgeon: Carolan Clines, MD;  Location: [facility];  Service: Urology;  Laterality: N/A;  . UMBILICAL HERNIA REPAIR  2007 approx    There were no vitals filed for this visit.  Subjective Assessment - 11/20/17 0818    Subjective  Reports some discomfort in L  shoulder.    Pertinent History  Cervical disc disease; OA.    Patient Stated Goals  Use left arm again.    Currently in Pain?  Yes    Pain Score  2     Pain Location  Shoulder    Pain Orientation  Left    Pain Descriptors / Indicators  Discomfort    Pain Type  Surgical pain    Pain Onset  More than a month ago         [facility] PT Assessment - 11/20/17 0001      Assessment   Medical Diagnosis  left shoulder full thickness tear.    Onset Date/Surgical Date  09/25/17    Next MD Visit  12/03/2017      Restrictions   Weight Bearing Restrictions  No                  [facility] Adult PT Treatment/Exercise - 11/20/17 0001      Modalities   Modalities  Electrical Stimulation;Vasopneumatic      Electrical Stimulation   Electrical Stimulation Location  L shoulder    Electrical Stimulation Action  IFC    Electrical Stimulation Parameters  1-10 hz x15 min    Electrical Stimulation Goals  Pain;Edema      Vasopneumatic   Number Minutes Vasopneumatic   15 minutes    Vasopnuematic Location  Shoulder    Vasopneumatic Pressure  Low    Vasopneumatic Temperature   34      Manual Therapy   Manual Therapy  Passive ROM    Passive ROM  PROM of L shoulder into flexion, ER/IR with gentle holds at end range               PT Short Term Goals - 11/06/17 1508      PT SHORT TERM GOAL #1   Title  STG's=LTG's.        PT Long Term Goals - 11/20/17 0910      PT LONG TERM GOAL #1   Title  Independent with a HEP.    Time  4    Period  Weeks    Status  Achieved      PT LONG TERM GOAL #2   Title  Active left shoulder flexion to 145 degrees so the patient can easily reach overhead.    Time  4    Period  Weeks    Status  On-going      PT LONG TERM GOAL #3   Title  Active ER to 70 degrees+ to allow for easily donning/doffing of apparel.    Time  4    Period  Weeks    Status  On-going      PT LONG TERM GOAL #4   Title  Increase ROM so patient is able to reach behind back.     Time  4    Period  Weeks    Status  On-going      PT LONG TERM GOAL #5   Title  Increase left shoulder strength to a solid 4+/5 to increase stability for performance of functional activities.    Time  4    Period  Weeks    Status  On-going      PT LONG TERM GOAL #6   Title  Perform ADL's with pain not > 3/10.    Status  On-going            Plan - 11/20/17 0851    Clinical Impression Statement  Patient tolerated today's treatment well today with reduced 2/10 L shoulder pain. No complaints of any increased pain with PROM of L shoulder by patient. Oscillations were not required during today's treatment. Firm end feels and smooth arc of motion noted with PROM of L shoulder into flex/ER/IR. Normal modalities response noted following removal of the modalities.    Rehab Potential  Excellent    PT Frequency  3x / week    PT Duration  4 weeks    PT Treatment/Interventions  ADLs/Self Care Home Management;Cryotherapy;Electrical Stimulation;Ultrasound;Moist Heat;Therapeutic activities;Therapeutic exercise;Patient/family education;Neuromuscular re-education;Manual techniques;Passive range of motion;Vasopneumatic Device    PT Next Visit Plan  Begin with PROM to patient's left shoulder.  Low vasopneumatic and low-level e'stim.  Pillow between elbow and thorax while on vasopneumatic.    Consulted and Agree with Plan of Care  Patient       Patient will benefit from skilled therapeutic intervention in order to improve the following deficits and impairments:  Decreased activity tolerance, Decreased range of motion, Pain  Visit Diagnosis: Chronic left shoulder pain  Stiffness of left shoulder, not elsewhere classified     Problem List Patient Active Problem List   Diagnosis Date Noted  . Chronic night sweats 07/31/2016  . DDD (degenerative disc disease), cervical 07/29/2015  . Prediabetes 06/30/2013  . Erectile dysfunction 06/30/2013  . Routine general medical examination at  a health  care facility 04/12/2011  . ESOPHAGEAL STRICTURE 12/14/2008  . Gastritis and gastroduodenitis 12/14/2008  . BPH associated with nocturia 12/03/2008  . Osteoarthrosis, hand 12/03/2008  . Hyperlipidemia with target LDL less than 130 09/28/2008    Standley Brooking, PTA 11/20/2017, 9:11 AM  [facility] [address] [place], [place], [address] Phone: [phone]   Fax:  [phone]  Name: Don Johnson MRN: [id number] Date of Birth: [birth year]

## 2017-11-22 ENCOUNTER — Ambulatory Visit: Payer: Medicare Other | Admitting: Physical Therapy

## 2017-11-22 ENCOUNTER — Encounter: Payer: Self-pay | Admitting: Physical Therapy

## 2017-11-22 DIAGNOSIS — G8929 Other chronic pain: Secondary | ICD-10-CM

## 2017-11-22 DIAGNOSIS — M25512 Pain in left shoulder: Secondary | ICD-10-CM | POA: Diagnosis not present

## 2017-11-22 DIAGNOSIS — M25612 Stiffness of left shoulder, not elsewhere classified: Secondary | ICD-10-CM

## 2017-11-22 NOTE — Therapy (Signed)
[facility] [place], [place], [address] Phone: [phone]   Fax:  [phone]  Physical Therapy Treatment  Patient Details  Name: Don Johnson MRN: [id number] Date of Birth: 01/01/1941 Referring Provider: Bard Herbert MD   Encounter Date: 11/22/2017  PT End of Session - 11/22/17 0815    Visit Number  6    Number of Visits  12    Date for PT Re-Evaluation  12/04/17    Authorization Type  FOTO AT LEAST EVERY 5TH VISIT.    PT Start Time  [phone]    PT Stop Time  0901    PT Time Calculation (min)  45 min    Activity Tolerance  Patient tolerated treatment well    Behavior During Therapy  [facility] for tasks assessed/performed       Past Medical History:  Diagnosis Date  . Bilateral recurrent inguinal hernia   . BPH with obstruction/lower urinary tract symptoms   . Cough 09/20/2016  . DDD (degenerative disc disease), cervical   . Diverticulosis of colon   . GERD (gastroesophageal reflux disease)   . Hiatal hernia   . History of adenomatous polyp of colon    2006 tubular adenoma;  2010 tubular adenoma and hyperplastic  . Hyperlipidemia   . OA (osteoarthritis)    hands  . Organic sexual dysfunction   . Pre-diabetes   . S/P dilatation of esophageal stricture    multiple times --  last time w/ balloon 12-31-2015  . Wears dentures    upper denture, lower partial  . Wears glasses     Past Surgical History:  Procedure Laterality Date  . COLONOSCOPY  last one 03-14-2011  . ESOPHAGOGASTRODUODENOSCOPY (EGD) WITH ESOPHAGEAL DILATION  last one 12-31-2015  . HEMORROIDECTOMY  1970's  . INGUINAL HERNIA REPAIR Bilateral 1980's  . PENILE PROSTHESIS IMPLANT N/A 09/29/2016   Procedure: PENILE PROTHESIS INFLATABLE;  Surgeon: Carolan Clines, MD;  Location: [facility];  Service: Urology;  Laterality: N/A;  . UMBILICAL HERNIA REPAIR  2007 approx    There were no vitals filed for this visit.  Subjective Assessment  - 11/22/17 0815    Subjective  Reports that he has some L shoulder soreness upon arrival. Doesn't know if he slept on his shoulder or something.    Pertinent History  Cervical disc disease; OA.    Patient Stated Goals  Use left arm again.    Currently in Pain?  Yes    Pain Score  2     Pain Location  Shoulder    Pain Orientation  Left    Pain Descriptors / Indicators  Sore    Pain Type  Surgical pain    Pain Onset  More than a month ago         [facility] PT Assessment - 11/22/17 0001      Assessment   Medical Diagnosis  left shoulder full thickness tear.    Onset Date/Surgical Date  09/25/17    Next MD Visit  12/03/2017      Restrictions   Weight Bearing Restrictions  No                  [facility] Adult PT Treatment/Exercise - 11/22/17 0001      Modalities   Modalities  Electrical Stimulation;Vasopneumatic      Electrical Stimulation   Electrical Stimulation Location  L shoulder    Electrical Stimulation Action  Pre-Mod    Electrical Stimulation Parameters  80-150 hz  x15 min    Electrical Stimulation Goals  Pain;Edema      Vasopneumatic   Number Minutes Vasopneumatic   15 minutes    Vasopnuematic Location   Shoulder    Vasopneumatic Pressure  Medium    Vasopneumatic Temperature   34      Manual Therapy   Manual Therapy  Passive ROM    Passive ROM  PROM of L shoulder into flexion, ER/IR with gentle holds at end range               PT Short Term Goals - 11/06/17 1508      PT SHORT TERM GOAL #1   Title  STG's=LTG's.        PT Long Term Goals - 11/20/17 0910      PT LONG TERM GOAL #1   Title  Independent with a HEP.    Time  4    Period  Weeks    Status  Achieved      PT LONG TERM GOAL #2   Title  Active left shoulder flexion to 145 degrees so the patient can easily reach overhead.    Time  4    Period  Weeks    Status  On-going      PT LONG TERM GOAL #3   Title  Active ER to 70 degrees+ to allow for easily donning/doffing of apparel.     Time  4    Period  Weeks    Status  On-going      PT LONG TERM GOAL #4   Title  Increase ROM so patient is able to reach behind back.    Time  4    Period  Weeks    Status  On-going      PT LONG TERM GOAL #5   Title  Increase left shoulder strength to a solid 4+/5 to increase stability for performance of functional activities.    Time  4    Period  Weeks    Status  On-going      PT LONG TERM GOAL #6   Title  Perform ADL's with pain not > 3/10.    Status  On-going            Plan - 11/22/17 0856    Clinical Impression Statement  Patient tolerated today's treatment fairly well with reports of L shoulder soreness upon arrival. No additional reports of any increased pain or soreness throughout treatment. Patient continues to have firm end feels and smooth arc of motions with all directions of PROM of L shoulder. Normal modalities response noted following removal of the modalities.    Rehab Potential  Excellent    PT Frequency  3x / week    PT Duration  4 weeks    PT Treatment/Interventions  ADLs/Self Care Home Management;Cryotherapy;Electrical Stimulation;Ultrasound;Moist Heat;Therapeutic activities;Therapeutic exercise;Patient/family education;Neuromuscular re-education;Manual techniques;Passive range of motion;Vasopneumatic Device    PT Next Visit Plan  Begin with PROM to patient's left shoulder.  Low vasopneumatic and low-level e'stim.  Pillow between elbow and thorax while on vasopneumatic.    Consulted and Agree with Plan of Care  Patient       Patient will benefit from skilled therapeutic intervention in order to improve the following deficits and impairments:  Decreased activity tolerance, Decreased range of motion, Pain  Visit Diagnosis: Chronic left shoulder pain  Stiffness of left shoulder, not elsewhere classified     Problem List Patient Active Problem List   Diagnosis Date Noted  .  Chronic night sweats 07/31/2016  . DDD (degenerative disc disease),  cervical 07/29/2015  . Prediabetes 06/30/2013  . Erectile dysfunction 06/30/2013  . Routine general medical examination at a health care facility 04/12/2011  . ESOPHAGEAL STRICTURE 12/14/2008  . Gastritis and gastroduodenitis 12/14/2008  . BPH associated with nocturia 12/03/2008  . Osteoarthrosis, hand 12/03/2008  . Hyperlipidemia with target LDL less than 130 09/28/2008    Standley Brooking, PTA 11/22/2017, 9:07 AM  [facility] [place], [place], [address] Phone: [phone]   Fax:  [phone]  Name: Don Johnson MRN: [id number] Date of Birth: 1941/08/02

## 2017-11-27 ENCOUNTER — Encounter: Payer: Self-pay | Admitting: Physical Therapy

## 2017-11-27 ENCOUNTER — Ambulatory Visit: Payer: Medicare Other | Admitting: Physical Therapy

## 2017-11-27 DIAGNOSIS — M25612 Stiffness of left shoulder, not elsewhere classified: Secondary | ICD-10-CM | POA: Diagnosis not present

## 2017-11-27 DIAGNOSIS — M25512 Pain in left shoulder: Principal | ICD-10-CM

## 2017-11-27 DIAGNOSIS — G8929 Other chronic pain: Secondary | ICD-10-CM | POA: Diagnosis not present

## 2017-11-27 NOTE — Therapy (Signed)
[facility] [place], [place], [address] Phone: [phone]   Fax:  [phone]  Physical Therapy Treatment  Patient Details  Name: Don Johnson MRN: [id number] Date of Birth: May 12, 1941 Referring Provider: Bard Herbert MD   Encounter Date: 11/27/2017  PT End of Session - 11/27/17 0816    Visit Number  7    Number of Visits  12    Date for PT Re-Evaluation  12/04/17    Authorization Type  FOTO AT LEAST EVERY 5TH VISIT.    PT Start Time  0815    PT Stop Time  0906    PT Time Calculation (min)  51 min    Activity Tolerance  Patient tolerated treatment well    Behavior During Therapy  [facility] for tasks assessed/performed       Past Medical History:  Diagnosis Date  . Bilateral recurrent inguinal hernia   . BPH with obstruction/lower urinary tract symptoms   . Cough 09/20/2016  . DDD (degenerative disc disease), cervical   . Diverticulosis of colon   . GERD (gastroesophageal reflux disease)   . Hiatal hernia   . History of adenomatous polyp of colon    2006 tubular adenoma;  2010 tubular adenoma and hyperplastic  . Hyperlipidemia   . OA (osteoarthritis)    hands  . Organic sexual dysfunction   . Pre-diabetes   . S/P dilatation of esophageal stricture    multiple times --  last time w/ balloon 12-31-2015  . Wears dentures    upper denture, lower partial  . Wears glasses     Past Surgical History:  Procedure Laterality Date  . COLONOSCOPY  last one 03-14-2011  . ESOPHAGOGASTRODUODENOSCOPY (EGD) WITH ESOPHAGEAL DILATION  last one 12-31-2015  . HEMORROIDECTOMY  1970's  . INGUINAL HERNIA REPAIR Bilateral 1980's  . PENILE PROSTHESIS IMPLANT N/A 09/29/2016   Procedure: PENILE PROTHESIS INFLATABLE;  Surgeon: Carolan Clines, MD;  Location: [facility];  Service: Urology;  Laterality: N/A;  . UMBILICAL HERNIA REPAIR  2007 approx    There were no vitals filed for this visit.  Subjective Assessment  - 11/27/17 0815    Subjective  Reports increased discomfort for unknown reason per patient report.    Pertinent History  Cervical disc disease; OA.    Patient Stated Goals  Use left arm again.    Currently in Pain?  Yes    Pain Score  3     Pain Location  Shoulder    Pain Orientation  Left    Pain Descriptors / Indicators  Sore    Pain Type  Surgical pain    Pain Onset  More than a month ago         [facility] PT Assessment - 11/27/17 0001      Assessment   Medical Diagnosis  left shoulder full thickness tear.    Onset Date/Surgical Date  09/25/17    Next MD Visit  11/30/2017      Restrictions   Weight Bearing Restrictions  No                  [facility] Adult PT Treatment/Exercise - 11/27/17 0001      Exercises   Exercises  Shoulder      Shoulder Exercises: Supine   Protraction  AAROM;Both;20 reps    External Rotation  AAROM;Left;20 reps    Flexion  AAROM;Both;20 reps      Shoulder Exercises: Pulleys   Flexion  5 minutes  Other Pulley Exercises  Seated LUE ranger into flexion, circles x30 reps    Other Pulley Exercises  LUE wall ladder x5 reps      Modalities   Modalities  Electrical Stimulation;Vasopneumatic      Electrical Stimulation   Electrical Stimulation Location  L shoulder    Electrical Stimulation Action  IFC    Electrical Stimulation Parameters  1-10 hz x15 min    Electrical Stimulation Goals  Pain;Edema      Vasopneumatic   Number Minutes Vasopneumatic   15 minutes    Vasopnuematic Location   Shoulder    Vasopneumatic Pressure  Low    Vasopneumatic Temperature   34      Manual Therapy   Manual Therapy  Passive ROM    Passive ROM  PROM of L shoulder into flexion, ER/IR with gentle holds at end range             PT Education - 11/27/17 0858    Education provided  Yes    Education Details  HEP- AAROM protraction, flexion    Person(s) Educated  Patient    Methods  Explanation;Handout    Comprehension  Verbalized understanding        PT Short Term Goals - 11/06/17 1508      PT SHORT TERM GOAL #1   Title  STG's=LTG's.        PT Long Term Goals - 11/20/17 0910      PT LONG TERM GOAL #1   Title  Independent with a HEP.    Time  4    Period  Weeks    Status  Achieved      PT LONG TERM GOAL #2   Title  Active left shoulder flexion to 145 degrees so the patient can easily reach overhead.    Time  4    Period  Weeks    Status  On-going      PT LONG TERM GOAL #3   Title  Active ER to 70 degrees+ to allow for easily donning/doffing of apparel.    Time  4    Period  Weeks    Status  On-going      PT LONG TERM GOAL #4   Title  Increase ROM so patient is able to reach behind back.    Time  4    Period  Weeks    Status  On-going      PT LONG TERM GOAL #5   Title  Increase left shoulder strength to a solid 4+/5 to increase stability for performance of functional activities.    Time  4    Period  Weeks    Status  On-going      PT LONG TERM GOAL #6   Title  Perform ADL's with pain not > 3/10.    Status  On-going            Plan - 11/27/17 0859    Clinical Impression Statement  Patient tolerated today's treatment fairly well as he was progressed into AAROM exercises. Patient guided through new AAROM exercises with demo and VCs for technique demonstrations as well as tactile cues for technique corrections. No increased pain reports reported by patient. Greatest cueing required for [facility] ER as patien required tactile cues for technique correction. Patient provided new HEP with education regarding parameters as well as to continue previous HEP. Normal modalities response noted following removal of the modalities.     Rehab Potential  Excellent  PT Frequency  3x / week    PT Duration  4 weeks    PT Treatment/Interventions  ADLs/Self Care Home Management;Cryotherapy;Electrical Stimulation;Ultrasound;Moist Heat;Therapeutic activities;Therapeutic exercise;Patient/family education;Neuromuscular  re-education;Manual techniques;Passive range of motion;Vasopneumatic Device    PT Next Visit Plan  Continue AAROM per protocol with modalities per MPT POC.    Consulted and Agree with Plan of Care  Patient       Patient will benefit from skilled therapeutic intervention in order to improve the following deficits and impairments:  Decreased activity tolerance, Decreased range of motion, Pain  Visit Diagnosis: Chronic left shoulder pain  Stiffness of left shoulder, not elsewhere classified     Problem List Patient Active Problem List   Diagnosis Date Noted  . Chronic night sweats 07/31/2016  . DDD (degenerative disc disease), cervical 07/29/2015  . Prediabetes 06/30/2013  . Erectile dysfunction 06/30/2013  . Routine general medical examination at a health care facility 04/12/2011  . ESOPHAGEAL STRICTURE 12/14/2008  . Gastritis and gastroduodenitis 12/14/2008  . BPH associated with nocturia 12/03/2008  . Osteoarthrosis, hand 12/03/2008  . Hyperlipidemia with target LDL less than 130 09/28/2008    Standley Brooking, PTA 11/27/2017, 9:29 AM  [facility] [address] [place], [place], [address] Phone: [phone]   Fax:  [phone]  Name: Don Johnson MRN: [id number] Date of Birth: 1941/02/27

## 2017-11-29 ENCOUNTER — Encounter: Payer: Self-pay | Admitting: Physical Therapy

## 2017-11-29 ENCOUNTER — Ambulatory Visit: Payer: Medicare Other | Admitting: Physical Therapy

## 2017-11-29 DIAGNOSIS — M25612 Stiffness of left shoulder, not elsewhere classified: Secondary | ICD-10-CM

## 2017-11-29 DIAGNOSIS — M25512 Pain in left shoulder: Principal | ICD-10-CM

## 2017-11-29 DIAGNOSIS — G8929 Other chronic pain: Secondary | ICD-10-CM

## 2017-11-29 NOTE — Therapy (Signed)
[facility] [place], [place], [address] Phone: [phone]   Fax:  [phone]  Physical Therapy Treatment  Patient Details  Name: Don Johnson MRN: [id number] Date of Birth: [birth year] Referring Provider: Bard Herbert MD   Encounter Date: 11/29/2017  PT End of Session - 11/29/17 1041    Visit Number  8    Number of Visits  12    Date for PT Re-Evaluation  12/04/17    Authorization Type  FOTO AT LEAST EVERY 5TH VISIT.    PT Start Time  1031    PT Stop Time  1124    PT Time Calculation (min)  53 min    Activity Tolerance  Patient tolerated treatment well    Behavior During Therapy  [facility] for tasks assessed/performed       Past Medical History:  Diagnosis Date  . Bilateral recurrent inguinal hernia   . BPH with obstruction/lower urinary tract symptoms   . Cough 09/20/2016  . DDD (degenerative disc disease), cervical   . Diverticulosis of colon   . GERD (gastroesophageal reflux disease)   . Hiatal hernia   . History of adenomatous polyp of colon    2006 tubular adenoma;  2010 tubular adenoma and hyperplastic  . Hyperlipidemia   . OA (osteoarthritis)    hands  . Organic sexual dysfunction   . Pre-diabetes   . S/P dilatation of esophageal stricture    multiple times --  last time w/ balloon 12-31-2015  . Wears dentures    upper denture, lower partial  . Wears glasses     Past Surgical History:  Procedure Laterality Date  . COLONOSCOPY  last one 03-14-2011  . ESOPHAGOGASTRODUODENOSCOPY (EGD) WITH ESOPHAGEAL DILATION  last one 12-31-2015  . HEMORROIDECTOMY  1970's  . INGUINAL HERNIA REPAIR Bilateral 1980's  . PENILE PROSTHESIS IMPLANT N/A 09/29/2016   Procedure: PENILE PROTHESIS INFLATABLE;  Surgeon: Carolan Clines, MD;  Location: [facility];  Service: Urology;  Laterality: N/A;  . UMBILICAL HERNIA REPAIR  2007 approx    There were no vitals filed for this visit.  Subjective Assessment  - 11/29/17 1035    Subjective  Reports that the pulley helps loosen him up for exercises.    Pertinent History  Cervical disc disease; OA.    Patient Stated Goals  Use left arm again.    Currently in Pain?  Yes    Pain Score  2     Pain Location  Shoulder    Pain Orientation  Left    Pain Descriptors / Indicators  Discomfort    Pain Type  Surgical pain    Pain Onset  More than a month ago    Pain Frequency  Intermittent    Aggravating Factors   Certain movements         [facility] PT Assessment - 11/29/17 0001      Assessment   Medical Diagnosis  left shoulder full thickness tear.    Onset Date/Surgical Date  09/25/17    Next MD Visit  12/03/2017      Restrictions   Weight Bearing Restrictions  No      ROM / Strength   AROM / PROM / Strength  AROM      AROM   Overall AROM   Deficits    AROM Assessment Site  Shoulder    Right/Left Shoulder  Left    Left Shoulder Flexion  121 Degrees    Left Shoulder Internal Rotation  55 Degrees    Left Shoulder External Rotation  60 Degrees                  [facility] Adult PT Treatment/Exercise - 11/29/17 0001      Shoulder Exercises: Supine   Protraction  AAROM;Both;20 reps    External Rotation  AAROM;Left;20 reps    Flexion  AAROM;Both;20 reps      Shoulder Exercises: Pulleys   Flexion  5 minutes    Other Pulley Exercises  Seated LUE ranger into flexion, CW and CCW circles x20 reps    Other Pulley Exercises  LUE wall ladder x5 reps      Modalities   Modalities  Electrical Stimulation;Vasopneumatic      Electrical Stimulation   Electrical Stimulation Location  L shoulder    Electrical Stimulation Action  IFC    Electrical Stimulation Parameters  1-10 hz x15 min    Electrical Stimulation Goals  Pain      Vasopneumatic   Number Minutes Vasopneumatic   15 minutes    Vasopnuematic Location   Shoulder    Vasopneumatic Pressure  Medium    Vasopneumatic Temperature   34      Manual Therapy   Manual Therapy  Passive ROM     Passive ROM  PROM of L shoulder into flexion, ER/IR with gentle holds at end range               PT Short Term Goals - 11/06/17 1508      PT SHORT TERM GOAL #1   Title  STG's=LTG's.        PT Long Term Goals - 11/20/17 0910      PT LONG TERM GOAL #1   Title  Independent with a HEP.    Time  4    Period  Weeks    Status  Achieved      PT LONG TERM GOAL #2   Title  Active left shoulder flexion to 145 degrees so the patient can easily reach overhead.    Time  4    Period  Weeks    Status  On-going      PT LONG TERM GOAL #3   Title  Active ER to 70 degrees+ to allow for easily donning/doffing of apparel.    Time  4    Period  Weeks    Status  On-going      PT LONG TERM GOAL #4   Title  Increase ROM so patient is able to reach behind back.    Time  4    Period  Weeks    Status  On-going      PT LONG TERM GOAL #5   Title  Increase left shoulder strength to a solid 4+/5 to increase stability for performance of functional activities.    Time  4    Period  Weeks    Status  On-going      PT LONG TERM GOAL #6   Title  Perform ADL's with pain not > 3/10.    Status  On-going            Plan - 11/29/17 1224    Clinical Impression Statement  Patient continues to tolerate treatment well with only reports of pain with intermittant shoulder positions and occurances of L shoulder popping that are not painful. Patient provided more visual cues to correct AAROM L shoulder ER today. No increased pain reported by patient with exercises. Patient reports compliance with HEP  although he may be limiting himself with reps of HEP. Smooth arc of motion and firm end feels noted with all directions of PROM. Normal modalities response noted following removal of the modalities. FOTO score improved to 44% from 96% at evaluation.    Rehab Potential  Excellent    PT Frequency  3x / week    PT Duration  4 weeks    PT Treatment/Interventions  ADLs/Self Care Home  Management;Cryotherapy;Electrical Stimulation;Ultrasound;Moist Heat;Therapeutic activities;Therapeutic exercise;Patient/family education;Neuromuscular re-education;Manual techniques;Passive range of motion;Vasopneumatic Device    PT Next Visit Plan  Continue AAROM per protocol with modalities per MPT POC.    Consulted and Agree with Plan of Care  Patient       Patient will benefit from skilled therapeutic intervention in order to improve the following deficits and impairments:  Decreased activity tolerance, Decreased range of motion, Pain  Visit Diagnosis: Chronic left shoulder pain  Stiffness of left shoulder, not elsewhere classified     Problem List Patient Active Problem List   Diagnosis Date Noted  . Chronic night sweats 07/31/2016  . DDD (degenerative disc disease), cervical 07/29/2015  . Prediabetes 06/30/2013  . Erectile dysfunction 06/30/2013  . Routine general medical examination at a health care facility 04/12/2011  . ESOPHAGEAL STRICTURE 12/14/2008  . Gastritis and gastroduodenitis 12/14/2008  . BPH associated with nocturia 12/03/2008  . Osteoarthrosis, hand 12/03/2008  . Hyperlipidemia with target LDL less than 130 09/28/2008    Standley Brooking, PTA 11/29/17 12:30 PM   [facility] [address] [place], [place], [address] Phone: [phone]   Fax:  [phone]  Name: Don Johnson MRN: [id number] Date of Birth: 05-05-1941

## 2017-12-03 DIAGNOSIS — Z4789 Encounter for other orthopedic aftercare: Secondary | ICD-10-CM | POA: Insufficient documentation

## 2017-12-04 ENCOUNTER — Encounter: Payer: Self-pay | Admitting: Physical Therapy

## 2017-12-04 ENCOUNTER — Ambulatory Visit: Payer: Medicare Other | Admitting: Physical Therapy

## 2017-12-04 DIAGNOSIS — G8929 Other chronic pain: Secondary | ICD-10-CM | POA: Diagnosis not present

## 2017-12-04 DIAGNOSIS — M25612 Stiffness of left shoulder, not elsewhere classified: Secondary | ICD-10-CM | POA: Diagnosis not present

## 2017-12-04 DIAGNOSIS — M25512 Pain in left shoulder: Secondary | ICD-10-CM | POA: Diagnosis not present

## 2017-12-04 NOTE — Therapy (Signed)
[facility] [place], [place], [address] Phone: [phone]   Fax:  [phone]  Physical Therapy Treatment  Patient Details  Name: Don Johnson MRN: [id number] Date of Birth: 1941-09-06 Referring Provider: Bard Herbert MD   Encounter Date: 12/04/2017  PT End of Session - 12/04/17 0819    Visit Number  9    Number of Visits  24 per NO signed by PA for 2-3x/week for 3-4 weeks     Date for PT Re-Evaluation  12/31/17    Authorization Type  FOTO AT LEAST EVERY 5TH VISIT.    PT Start Time  0817    PT Stop Time  0907    PT Time Calculation (min)  50 min    Activity Tolerance  Patient tolerated treatment well    Behavior During Therapy  [facility] for tasks assessed/performed       Past Medical History:  Diagnosis Date  . Bilateral recurrent inguinal hernia   . BPH with obstruction/lower urinary tract symptoms   . Cough 09/20/2016  . DDD (degenerative disc disease), cervical   . Diverticulosis of colon   . GERD (gastroesophageal reflux disease)   . Hiatal hernia   . History of adenomatous polyp of colon    2006 tubular adenoma;  2010 tubular adenoma and hyperplastic  . Hyperlipidemia   . OA (osteoarthritis)    hands  . Organic sexual dysfunction   . Pre-diabetes   . S/P dilatation of esophageal stricture    multiple times --  last time w/ balloon 12-31-2015  . Wears dentures    upper denture, lower partial  . Wears glasses     Past Surgical History:  Procedure Laterality Date  . COLONOSCOPY  last one 03-14-2011  . ESOPHAGOGASTRODUODENOSCOPY (EGD) WITH ESOPHAGEAL DILATION  last one 12-31-2015  . HEMORROIDECTOMY  1970's  . INGUINAL HERNIA REPAIR Bilateral 1980's  . PENILE PROSTHESIS IMPLANT N/A 09/29/2016   Procedure: PENILE PROTHESIS INFLATABLE;  Surgeon: Carolan Clines, MD;  Location: [facility];  Service: Urology;  Laterality: N/A;  . UMBILICAL HERNIA REPAIR  2007 approx    There were no  vitals filed for this visit.  Subjective Assessment - 12/04/17 0818    Subjective  Reports that PA was pleased with his progress.    Pertinent History  Cervical disc disease; OA.    Patient Stated Goals  Use left arm again.    Currently in Pain?  Yes    Pain Score  3     Pain Location  Shoulder    Pain Orientation  Left;Posterior    Pain Descriptors / Indicators  Discomfort    Pain Type  Surgical pain    Pain Onset  More than a month ago    Pain Frequency  Intermittent         [facility] PT Assessment - 12/04/17 0001      Assessment   Medical Diagnosis  left shoulder full thickness tear.    Onset Date/Surgical Date  09/25/17    Next MD Visit  12/31/2017      Restrictions   Weight Bearing Restrictions  No            No data recorded       [facility] Adult PT Treatment/Exercise - 12/04/17 0001      Shoulder Exercises: Supine   Protraction  AAROM;Both;20 reps    External Rotation  AAROM;Left;20 reps    Flexion  AAROM;Both;20 reps      Shoulder  Exercises: Standing   Other Standing Exercises  LUE wall slides with ER x20 reps      Shoulder Exercises: Pulleys   Flexion  5 minutes    Other Pulley Exercises  Standing UE ranger into flex, circles x20 reps      Modalities   Modalities  Electrical Stimulation;Vasopneumatic      Electrical Stimulation   Electrical Stimulation Location  L shoulder    Electrical Stimulation Action  IFC    Electrical Stimulation Parameters  1-10 hz x15 min    Electrical Stimulation Goals  Pain      Vasopneumatic   Number Minutes Vasopneumatic   15 minutes    Vasopnuematic Location   Shoulder    Vasopneumatic Pressure  Medium    Vasopneumatic Temperature   34      Manual Therapy   Manual Therapy  Passive ROM    Passive ROM  PROM of L shoulder into flexion, ER/IR with gentle holds at end range with rhythmic stabs in ER and flexion               PT Short Term Goals - 11/06/17 1508      PT SHORT TERM GOAL #1   Title   STG's=LTG's.        PT Long Term Goals - 11/20/17 0910      PT LONG TERM GOAL #1   Title  Independent with a HEP.    Time  4    Period  Weeks    Status  Achieved      PT LONG TERM GOAL #2   Title  Active left shoulder flexion to 145 degrees so the patient can easily reach overhead.    Time  4    Period  Weeks    Status  On-going      PT LONG TERM GOAL #3   Title  Active ER to 70 degrees+ to allow for easily donning/doffing of apparel.    Time  4    Period  Weeks    Status  On-going      PT LONG TERM GOAL #4   Title  Increase ROM so patient is able to reach behind back.    Time  4    Period  Weeks    Status  On-going      PT LONG TERM GOAL #5   Title  Increase left shoulder strength to a solid 4+/5 to increase stability for performance of functional activities.    Time  4    Period  Weeks    Status  On-going      PT LONG TERM GOAL #6   Title  Perform ADL's with pain not > 3/10.    Status  On-going            Plan - 12/04/17 0859    Clinical Impression Statement  Patient tolerated today's treatment well as he tolerated new exercises well. Able to tolerate more AAROM exercises in antigravity without complaint of pain. More visual cues provided for [facility] ER for proper technique. Firm end feels and smooth arc of motion with L shoulder PROM in all directions. Fairly good L shoulder rhythmic stabilizations in ER and flexion. Normal modalities response noted following removal of the modalities.    Rehab Potential  Excellent    PT Frequency  3x / week    PT Duration  4 weeks    PT Treatment/Interventions  ADLs/Self Care Home Management;Cryotherapy;Electrical Stimulation;Ultrasound;Moist Heat;Therapeutic activities;Therapeutic exercise;Patient/family education;Neuromuscular re-education;Manual techniques;Passive  range of motion;Vasopneumatic Device    PT Next Visit Plan  Continue AAROM per protocol with modalities per MPT POC.    Consulted and Agree with Plan of Care   Patient       Patient will benefit from skilled therapeutic intervention in order to improve the following deficits and impairments:  Decreased activity tolerance, Decreased range of motion, Pain  Visit Diagnosis: Chronic left shoulder pain  Stiffness of left shoulder, not elsewhere classified     Problem List Patient Active Problem List   Diagnosis Date Noted  . Chronic night sweats 07/31/2016  . DDD (degenerative disc disease), cervical 07/29/2015  . Prediabetes 06/30/2013  . Erectile dysfunction 06/30/2013  . Routine general medical examination at a health care facility 04/12/2011  . ESOPHAGEAL STRICTURE 12/14/2008  . Gastritis and gastroduodenitis 12/14/2008  . BPH associated with nocturia 12/03/2008  . Osteoarthrosis, hand 12/03/2008  . Hyperlipidemia with target LDL less than 130 09/28/2008    Standley Brooking, PTA 12/04/2017, 9:16 AM  [facility] [address] [place], [place], [address] Phone: [phone]   Fax:  [phone]  Name: Don Johnson MRN: [id number] Date of Birth: 20-May-1941

## 2017-12-06 ENCOUNTER — Ambulatory Visit: Payer: Medicare Other | Admitting: Physical Therapy

## 2017-12-06 ENCOUNTER — Encounter: Payer: Self-pay | Admitting: Physical Therapy

## 2017-12-06 DIAGNOSIS — M25512 Pain in left shoulder: Principal | ICD-10-CM

## 2017-12-06 DIAGNOSIS — M25612 Stiffness of left shoulder, not elsewhere classified: Secondary | ICD-10-CM | POA: Diagnosis not present

## 2017-12-06 DIAGNOSIS — G8929 Other chronic pain: Secondary | ICD-10-CM | POA: Diagnosis not present

## 2017-12-06 NOTE — Therapy (Signed)
[facility] [place], [place], [address] Phone: [phone]   Fax:  [phone]  Physical Therapy Treatment  Patient Details  Name: Don Johnson MRN: [id number] Date of Birth: 1941/05/08 Referring Provider: Bard Herbert MD   Encounter Date: 12/06/2017  PT End of Session - 12/06/17 0819    Visit Number  10    Number of Visits  24    Date for PT Re-Evaluation  12/31/17    Authorization Type  FOTO AT LEAST EVERY 5TH VISIT.    PT Start Time  0818    PT Stop Time  0908    PT Time Calculation (min)  50 min    Activity Tolerance  Patient tolerated treatment well    Behavior During Therapy  [facility] for tasks assessed/performed       Past Medical History:  Diagnosis Date  . Bilateral recurrent inguinal hernia   . BPH with obstruction/lower urinary tract symptoms   . Cough 09/20/2016  . DDD (degenerative disc disease), cervical   . Diverticulosis of colon   . GERD (gastroesophageal reflux disease)   . Hiatal hernia   . History of adenomatous polyp of colon    2006 tubular adenoma;  2010 tubular adenoma and hyperplastic  . Hyperlipidemia   . OA (osteoarthritis)    hands  . Organic sexual dysfunction   . Pre-diabetes   . S/P dilatation of esophageal stricture    multiple times --  last time w/ balloon 12-31-2015  . Wears dentures    upper denture, lower partial  . Wears glasses     Past Surgical History:  Procedure Laterality Date  . COLONOSCOPY  last one 03-14-2011  . ESOPHAGOGASTRODUODENOSCOPY (EGD) WITH ESOPHAGEAL DILATION  last one 12-31-2015  . HEMORROIDECTOMY  1970's  . INGUINAL HERNIA REPAIR Bilateral 1980's  . PENILE PROSTHESIS IMPLANT N/A 09/29/2016   Procedure: PENILE PROTHESIS INFLATABLE;  Surgeon: Carolan Clines, MD;  Location: [facility];  Service: Urology;  Laterality: N/A;  . UMBILICAL HERNIA REPAIR  2007 approx    There were no vitals filed for this visit.  Subjective  Assessment - 12/06/17 0818    Subjective  Reports that his shoulder is still pretty sore.    Pertinent History  Cervical disc disease; OA.    Patient Stated Goals  Use left arm again.    Currently in Pain?  Yes    Pain Score  2     Pain Location  Shoulder    Pain Orientation  Left    Pain Descriptors / Indicators  Sore    Pain Type  Surgical pain    Pain Onset  More than a month ago         [facility] PT Assessment - 12/06/17 0001      Assessment   Medical Diagnosis  left shoulder full thickness tear.    Onset Date/Surgical Date  09/25/17    Next MD Visit  12/31/2017      Restrictions   Weight Bearing Restrictions  No            No data recorded       [facility] Adult PT Treatment/Exercise - 12/06/17 0001      Shoulder Exercises: Supine   Protraction  AROM;Left;20 reps    External Rotation  AROM;Left;20 reps    Flexion  AROM;Left;20 reps    Other Supine Exercises  L shoulder A-Z x1 rep      Shoulder Exercises: Pulleys   Flexion  Other (comment) x7 min    Other Pulley Exercises  Standing UE ranger into flex, circles x40 reps    Other Pulley Exercises  LUE wall slides x20 reps      Modalities   Modalities  Electrical Stimulation;Vasopneumatic      Electrical Stimulation   Electrical Stimulation Location  L shoulder    Electrical Stimulation Action  Pre-Mod    Electrical Stimulation Parameters  80-150 hz x15 min    Electrical Stimulation Goals  Pain      Vasopneumatic   Number Minutes Vasopneumatic   15 minutes    Vasopnuematic Location   Shoulder    Vasopneumatic Pressure  Medium    Vasopneumatic Temperature   34               PT Short Term Goals - 11/06/17 1508      PT SHORT TERM GOAL #1   Title  STG's=LTG's.        PT Long Term Goals - 11/20/17 0910      PT LONG TERM GOAL #1   Title  Independent with a HEP.    Time  4    Period  Weeks    Status  Achieved      PT LONG TERM GOAL #2   Title  Active left shoulder flexion to 145 degrees so  the patient can easily reach overhead.    Time  4    Period  Weeks    Status  On-going      PT LONG TERM GOAL #3   Title  Active ER to 70 degrees+ to allow for easily donning/doffing of apparel.    Time  4    Period  Weeks    Status  On-going      PT LONG TERM GOAL #4   Title  Increase ROM so patient is able to reach behind back.    Time  4    Period  Weeks    Status  On-going      PT LONG TERM GOAL #5   Title  Increase left shoulder strength to a solid 4+/5 to increase stability for performance of functional activities.    Time  4    Period  Weeks    Status  On-going      PT LONG TERM GOAL #6   Title  Perform ADL's with pain not > 3/10.    Status  On-going            Plan - 12/06/17 0900    Clinical Impression Statement  Patient tolerated today's treatment well with advancement in exercises to more AROM. Increased muscle fatigue and weakness noted with all AROM exercises. Weakness of L shoulder flexors and bicep noted with therex as well. Normal modalities response noted following removal of the modalities. FOTO limitation at 44% for 10th visit.    Rehab Potential  Excellent    PT Frequency  3x / week    PT Duration  4 weeks    PT Treatment/Interventions  ADLs/Self Care Home Management;Cryotherapy;Electrical Stimulation;Ultrasound;Moist Heat;Therapeutic activities;Therapeutic exercise;Patient/family education;Neuromuscular re-education;Manual techniques;Passive range of motion;Vasopneumatic Device    PT Next Visit Plan  Continue AAROM- AROM per protocol with modalities per MPT POC.    Consulted and Agree with Plan of Care  Patient       Patient will benefit from skilled therapeutic intervention in order to improve the following deficits and impairments:  Decreased activity tolerance, Decreased range of motion, Pain  Visit Diagnosis: Chronic left  shoulder pain  Stiffness of left shoulder, not elsewhere classified     Problem List Patient Active Problem List    Diagnosis Date Noted  . Chronic night sweats 07/31/2016  . DDD (degenerative disc disease), cervical 07/29/2015  . Prediabetes 06/30/2013  . Erectile dysfunction 06/30/2013  . Routine general medical examination at a health care facility 04/12/2011  . ESOPHAGEAL STRICTURE 12/14/2008  . Gastritis and gastroduodenitis 12/14/2008  . BPH associated with nocturia 12/03/2008  . Osteoarthrosis, hand 12/03/2008  . Hyperlipidemia with target LDL less than 130 09/28/2008    Standley Brooking, PTA 12/06/2017, 9:11 AM  [facility] [place], [place], [address] Phone: [phone]   Fax:  [phone]  Name: Don Johnson MRN: [id number] Date of Birth: 12-18-1940

## 2017-12-11 ENCOUNTER — Ambulatory Visit: Payer: Medicare Other | Attending: Orthopedic Surgery | Admitting: Physical Therapy

## 2017-12-11 ENCOUNTER — Encounter: Payer: Self-pay | Admitting: Physical Therapy

## 2017-12-11 DIAGNOSIS — M25512 Pain in left shoulder: Secondary | ICD-10-CM | POA: Diagnosis not present

## 2017-12-11 DIAGNOSIS — M25612 Stiffness of left shoulder, not elsewhere classified: Secondary | ICD-10-CM | POA: Insufficient documentation

## 2017-12-11 DIAGNOSIS — G8929 Other chronic pain: Secondary | ICD-10-CM

## 2017-12-11 NOTE — Therapy (Signed)
[facility] [place], [place], [address] Phone: [phone]   Fax:  [phone]  Physical Therapy Treatment  Patient Details  Name: Don Johnson MRN: [id number] Date of Birth: [birth year] Referring Provider: Bard Herbert MD   Encounter Date: 12/11/2017  PT End of Session - 12/11/17 0818    Visit Number  11    Number of Visits  24    Date for PT Re-Evaluation  12/31/17    Authorization Type  FOTO AT LEAST EVERY 5TH VISIT.    PT Start Time  [phone]    PT Stop Time  0905    PT Time Calculation (min)  49 min    Activity Tolerance  Patient tolerated treatment well    Behavior During Therapy  [facility] for tasks assessed/performed       Past Medical History:  Diagnosis Date  . Bilateral recurrent inguinal hernia   . BPH with obstruction/lower urinary tract symptoms   . Cough 09/20/2016  . DDD (degenerative disc disease), cervical   . Diverticulosis of colon   . GERD (gastroesophageal reflux disease)   . Hiatal hernia   . History of adenomatous polyp of colon    2006 tubular adenoma;  2010 tubular adenoma and hyperplastic  . Hyperlipidemia   . OA (osteoarthritis)    hands  . Organic sexual dysfunction   . Pre-diabetes   . S/P dilatation of esophageal stricture    multiple times --  last time w/ balloon 12-31-2015  . Wears dentures    upper denture, lower partial  . Wears glasses     Past Surgical History:  Procedure Laterality Date  . COLONOSCOPY  last one 03-14-2011  . ESOPHAGOGASTRODUODENOSCOPY (EGD) WITH ESOPHAGEAL DILATION  last one 12-31-2015  . HEMORROIDECTOMY  1970's  . INGUINAL HERNIA REPAIR Bilateral 1980's  . PENILE PROSTHESIS IMPLANT N/A 09/29/2016   Procedure: PENILE PROTHESIS INFLATABLE;  Surgeon: Carolan Clines, MD;  [facility]  Service: Urology;  Laterality: N/A;  . UMBILICAL HERNIA REPAIR  2007 approx    There were no vitals filed for this visit.  Subjective Assessment  - 12/11/17 0818    Subjective  Reports that his shoulder is sore and may have slept on it wrong.    Pertinent History  Cervical disc disease; OA.    Patient Stated Goals  Use left arm again.    Currently in Pain?  Yes    Pain Score  2     Pain Location  Shoulder    Pain Orientation  Left    Pain Descriptors / Indicators  Sore    Pain Type  Surgical pain    Pain Onset  More than a month ago    Pain Frequency  Intermittent         [facility] PT Assessment - 12/11/17 0001      Assessment   Medical Diagnosis  left shoulder full thickness tear.    Onset Date/Surgical Date  09/25/17    Next MD Visit  12/31/2017      Restrictions   Weight Bearing Restrictions  No                   [facility] Adult PT Treatment/Exercise - 12/11/17 0001      Shoulder Exercises: Supine   Protraction  AROM;Left;Limitations    Protraction Limitations  3x10 reps    Flexion  AROM;Left;Limitations    Flexion Limitations  3x10 reps    Other Supine Exercises  L  shoulder A-Z x1 rep      Shoulder Exercises: Sidelying   External Rotation  AROM;Left;Limitations    External Rotation Limitations  3x10 reps    Flexion  AROM;Left;Limitations    Flexion Limitations  3x10 reps      Shoulder Exercises: Pulleys   Flexion  5 minutes    Other Pulley Exercises  LUE wall slides x20 reps      Shoulder Exercises: ROM/Strengthening   Wall Wash  CW and CCW x20 reps      Modalities   Modalities  Electrical Stimulation;Vasopneumatic      Electrical Stimulation   Electrical Stimulation Location  L shoulder    Electrical Stimulation Action  IFC    Electrical Stimulation Parameters  1-10 hz x15 min    Electrical Stimulation Goals  Pain      Vasopneumatic   Number Minutes Vasopneumatic   15 minutes    Vasopnuematic Location   Shoulder    Vasopneumatic Pressure  Low    Vasopneumatic Temperature   34               PT Short Term Goals - 11/06/17 1508      PT SHORT TERM GOAL #1   Title  STG's=LTG's.         PT Long Term Goals - 11/20/17 0910      PT LONG TERM GOAL #1   Title  Independent with a HEP.    Time  4    Period  Weeks    Status  Achieved      PT LONG TERM GOAL #2   Title  Active left shoulder flexion to 145 degrees so the patient can easily reach overhead.    Time  4    Period  Weeks    Status  On-going      PT LONG TERM GOAL #3   Title  Active ER to 70 degrees+ to allow for easily donning/doffing of apparel.    Time  4    Period  Weeks    Status  On-going      PT LONG TERM GOAL #4   Title  Increase ROM so patient is able to reach behind back.    Time  4    Period  Weeks    Status  On-going      PT LONG TERM GOAL #5   Title  Increase left shoulder strength to a solid 4+/5 to increase stability for performance of functional activities.    Time  4    Period  Weeks    Status  On-going      PT LONG TERM GOAL #6   Title  Perform ADL's with pain not > 3/10.    Status  On-going            Plan - 12/11/17 5009    Clinical Impression Statement  Patient tolerated today's treatment well with minimal L shoulder pain upon arrival. Patient guided through more antigravity strengthening/ AROM exercises with no reports of increased pain. Patient able to perform all exercises in multiple planes to stregthen against gravity with postural strengthening in scap retractors. Normal modalities response noted following removal of the modalities.    Rehab Potential  Excellent    PT Frequency  3x / week    PT Duration  4 weeks    PT Treatment/Interventions  ADLs/Self Care Home Management;Cryotherapy;Electrical Stimulation;Ultrasound;Moist Heat;Therapeutic activities;Therapeutic exercise;Patient/family education;Neuromuscular re-education;Manual techniques;Passive range of motion;Vasopneumatic Device    PT Next Visit Plan  Continue AAROM- AROM per protocol with modalities per MPT POC.    Consulted and Agree with Plan of Care  Patient       Patient will benefit from skilled  therapeutic intervention in order to improve the following deficits and impairments:  Decreased activity tolerance, Decreased range of motion, Pain  Visit Diagnosis: Chronic left shoulder pain  Stiffness of left shoulder, not elsewhere classified     Problem List Patient Active Problem List   Diagnosis Date Noted  . Chronic night sweats 07/31/2016  . DDD (degenerative disc disease), cervical 07/29/2015  . Prediabetes 06/30/2013  . Erectile dysfunction 06/30/2013  . Routine general medical examination at a health care facility 04/12/2011  . ESOPHAGEAL STRICTURE 12/14/2008  . Gastritis and gastroduodenitis 12/14/2008  . BPH associated with nocturia 12/03/2008  . Osteoarthrosis, hand 12/03/2008  . Hyperlipidemia with target LDL less than 130 09/28/2008    Standley Brooking, PTA 12/11/2017, 9:09 AM  [facility] [address] [place], [place], [address] Phone: [phone]   Fax:  [phone]  Name: DINA MOBLEY MRN: [id number] Date of Birth: 1941/06/21

## 2017-12-13 ENCOUNTER — Encounter: Payer: Self-pay | Admitting: Physical Therapy

## 2017-12-13 ENCOUNTER — Ambulatory Visit: Payer: Medicare Other | Admitting: Physical Therapy

## 2017-12-13 DIAGNOSIS — G8929 Other chronic pain: Secondary | ICD-10-CM | POA: Diagnosis not present

## 2017-12-13 DIAGNOSIS — M25512 Pain in left shoulder: Secondary | ICD-10-CM | POA: Diagnosis not present

## 2017-12-13 DIAGNOSIS — M25612 Stiffness of left shoulder, not elsewhere classified: Secondary | ICD-10-CM | POA: Diagnosis not present

## 2017-12-13 NOTE — Therapy (Signed)
[facility] [place], [place], [address] Phone: [phone]   Fax:  [phone]  Physical Therapy Treatment  Patient Details  Name: JOSHIAH TRAYNHAM MRN: [id number] Date of Birth: [birth year] Referring Provider: Bard Herbert MD   Encounter Date: 12/13/2017  PT End of Session - 12/13/17 0818    Visit Number  12    Number of Visits  24    Date for PT Re-Evaluation  12/31/17    Authorization Type  FOTO AT LEAST EVERY 5TH VISIT.    PT Start Time  [phone]    PT Stop Time  0901    PT Time Calculation (min)  44 min    Activity Tolerance  Patient tolerated treatment well    Behavior During Therapy  [facility] for tasks assessed/performed       Past Medical History:  Diagnosis Date  . Bilateral recurrent inguinal hernia   . BPH with obstruction/lower urinary tract symptoms   . Cough 09/20/2016  . DDD (degenerative disc disease), cervical   . Diverticulosis of colon   . GERD (gastroesophageal reflux disease)   . Hiatal hernia   . History of adenomatous polyp of colon    2006 tubular adenoma;  2010 tubular adenoma and hyperplastic  . Hyperlipidemia   . OA (osteoarthritis)    hands  . Organic sexual dysfunction   . Pre-diabetes   . S/P dilatation of esophageal stricture    multiple times --  last time w/ balloon 12-31-2015  . Wears dentures    upper denture, lower partial  . Wears glasses     Past Surgical History:  Procedure Laterality Date  . COLONOSCOPY  last one 03-14-2011  . ESOPHAGOGASTRODUODENOSCOPY (EGD) WITH ESOPHAGEAL DILATION  last one 12-31-2015  . HEMORROIDECTOMY  1970's  . INGUINAL HERNIA REPAIR Bilateral 1980's  . PENILE PROSTHESIS IMPLANT N/A 09/29/2016   Procedure: PENILE PROTHESIS INFLATABLE;  Surgeon: Carolan Clines, MD;  Location: [facility];  Service: Urology;  Laterality: N/A;  . UMBILICAL HERNIA REPAIR  2007 approx    There were no vitals filed for this visit.  Subjective Assessment  - 12/13/17 0818    Subjective  Reports that he slept on his shoulder a little last night and has a little discomfort.    Pertinent History  Cervical disc disease; OA.    Patient Stated Goals  Use left arm again.    Currently in Pain?  Yes    Pain Score  1     Pain Location  Shoulder    Pain Orientation  Left    Pain Descriptors / Indicators  Discomfort    Pain Type  Surgical pain    Pain Onset  More than a month ago    Pain Frequency  Intermittent         [facility] PT Assessment - 12/13/17 0001      Assessment   Medical Diagnosis  left shoulder full thickness tear.    Onset Date/Surgical Date  09/25/17    Next MD Visit  12/31/2017      Restrictions   Weight Bearing Restrictions  No                   [facility] Adult PT Treatment/Exercise - 12/13/17 0001      Shoulder Exercises: Supine   Protraction  AROM;Left;20 reps    Flexion  AROM;Left;20 reps    Other Supine Exercises  L shoulder A-Z x1 rep    Other Supine Exercises  L shoulder circles x20 reps      Shoulder Exercises: Sidelying   External Rotation  AROM;Left;20 reps    Flexion  AROM;Left;20 reps      Shoulder Exercises: Pulleys   Flexion  5 minutes    Other Pulley Exercises  LUE wall slides x20 reps      Shoulder Exercises: ROM/Strengthening   Wall Wash  CW and CCW x20 reps      Modalities   Modalities  Electrical Stimulation;Vasopneumatic      Electrical Stimulation   Electrical Stimulation Location  L shoulder    Electrical Stimulation Action  Pre-Mod    Electrical Stimulation Parameters  80-150 hz x15 min    Electrical Stimulation Goals  Pain      Vasopneumatic   Number Minutes Vasopneumatic   15 minutes    Vasopnuematic Location   Shoulder    Vasopneumatic Pressure  Low    Vasopneumatic Temperature   34               PT Short Term Goals - 11/06/17 1508      PT SHORT TERM GOAL #1   Title  STG's=LTG's.        PT Long Term Goals - 11/20/17 0910      PT LONG TERM GOAL #1   Title   Independent with a HEP.    Time  4    Period  Weeks    Status  Achieved      PT LONG TERM GOAL #2   Title  Active left shoulder flexion to 145 degrees so the patient can easily reach overhead.    Time  4    Period  Weeks    Status  On-going      PT LONG TERM GOAL #3   Title  Active ER to 70 degrees+ to allow for easily donning/doffing of apparel.    Time  4    Period  Weeks    Status  On-going      PT LONG TERM GOAL #4   Title  Increase ROM so patient is able to reach behind back.    Time  4    Period  Weeks    Status  On-going      PT LONG TERM GOAL #5   Title  Increase left shoulder strength to a solid 4+/5 to increase stability for performance of functional activities.    Time  4    Period  Weeks    Status  On-going      PT LONG TERM GOAL #6   Title  Perform ADL's with pain not > 3/10.    Status  On-going            Plan - 12/13/17 0855    Clinical Impression Statement  Patient tolerated today's treatment well although he fatigued more quickly with exercises as well as reported soreness intermittantly throughout treatment. All exercises completed today in antigravity positions. Patient experienced greater dificulty with SL flexion. Normal modalities response noted following removal of the modalities.    Rehab Potential  Excellent    PT Frequency  3x / week    PT Duration  4 weeks    PT Treatment/Interventions  ADLs/Self Care Home Management;Cryotherapy;Electrical Stimulation;Ultrasound;Moist Heat;Therapeutic activities;Therapeutic exercise;Patient/family education;Neuromuscular re-education;Manual techniques;Passive range of motion;Vasopneumatic Device    PT Next Visit Plan  Continue AAROM- AROM per protocol with modalities per MPT POC.    Consulted and Agree with Plan of Care  Patient  Patient will benefit from skilled therapeutic intervention in order to improve the following deficits and impairments:  Decreased activity tolerance, Decreased range of  motion, Pain  Visit Diagnosis: Chronic left shoulder pain  Stiffness of left shoulder, not elsewhere classified     Problem List Patient Active Problem List   Diagnosis Date Noted  . Chronic night sweats 07/31/2016  . DDD (degenerative disc disease), cervical 07/29/2015  . Prediabetes 06/30/2013  . Erectile dysfunction 06/30/2013  . Routine general medical examination at a health care facility 04/12/2011  . ESOPHAGEAL STRICTURE 12/14/2008  . Gastritis and gastroduodenitis 12/14/2008  . BPH associated with nocturia 12/03/2008  . Osteoarthrosis, hand 12/03/2008  . Hyperlipidemia with target LDL less than 130 09/28/2008    Standley Brooking, PTA 12/13/2017, 9:07 AM  [facility] [place], [place], [address] Phone: [phone]   Fax:  [phone]  Name: ROMMEL HOGSTON MRN: [id number] Date of Birth: 1940-12-30

## 2017-12-18 ENCOUNTER — Encounter: Payer: Self-pay | Admitting: Physical Therapy

## 2017-12-18 ENCOUNTER — Ambulatory Visit: Payer: Medicare Other | Admitting: Physical Therapy

## 2017-12-18 DIAGNOSIS — M25612 Stiffness of left shoulder, not elsewhere classified: Secondary | ICD-10-CM | POA: Diagnosis not present

## 2017-12-18 DIAGNOSIS — G8929 Other chronic pain: Secondary | ICD-10-CM

## 2017-12-18 DIAGNOSIS — M25512 Pain in left shoulder: Secondary | ICD-10-CM | POA: Diagnosis not present

## 2017-12-18 NOTE — Therapy (Signed)
[facility] [place], [place], [address] Phone: [phone]   Fax:  [phone]  Physical Therapy Treatment  Patient Details  Name: Don Johnson MRN: [id number] Date of Birth: [birth year] Referring Provider: Bard Herbert MD   Encounter Date: 12/18/2017  PT End of Session - 12/18/17 0816    Visit Number  13    Number of Visits  24    Date for PT Re-Evaluation  12/31/17    Authorization Type  FOTO AT LEAST EVERY 5TH VISIT.    PT Start Time  0815    PT Stop Time  0903    PT Time Calculation (min)  48 min    Activity Tolerance  Patient tolerated treatment well    Behavior During Therapy  [facility] for tasks assessed/performed       Past Medical History:  Diagnosis Date  . Bilateral recurrent inguinal hernia   . BPH with obstruction/lower urinary tract symptoms   . Cough 09/20/2016  . DDD (degenerative disc disease), cervical   . Diverticulosis of colon   . GERD (gastroesophageal reflux disease)   . Hiatal hernia   . History of adenomatous polyp of colon    2006 tubular adenoma;  2010 tubular adenoma and hyperplastic  . Hyperlipidemia   . OA (osteoarthritis)    hands  . Organic sexual dysfunction   . Pre-diabetes   . S/P dilatation of esophageal stricture    multiple times --  last time w/ balloon 12-31-2015  . Wears dentures    upper denture, lower partial  . Wears glasses     Past Surgical History:  Procedure Laterality Date  . COLONOSCOPY  last one 03-14-2011  . ESOPHAGOGASTRODUODENOSCOPY (EGD) WITH ESOPHAGEAL DILATION  last one 12-31-2015  . HEMORROIDECTOMY  1970's  . INGUINAL HERNIA REPAIR Bilateral 1980's  . PENILE PROSTHESIS IMPLANT N/A 09/29/2016   Procedure: PENILE PROTHESIS INFLATABLE;  Surgeon: Carolan Clines, MD;  Location: [facility];  Service: Urology;  Laterality: N/A;  . UMBILICAL HERNIA REPAIR  2007 approx    There were no vitals filed for this visit.  Subjective Assessment  - 12/18/17 0815    Subjective  Reports that his shoulder feels good today and reports that he has very minimal pain in L shoulder today.    Pertinent History  Cervical disc disease; OA.    Patient Stated Goals  Use left arm again.    Currently in Pain?  Yes    Pain Score  1     Pain Location  Shoulder    Pain Orientation  Left    Pain Descriptors / Indicators  Discomfort    Pain Type  Surgical pain    Pain Onset  More than a month ago    Pain Frequency  Intermittent         [facility] PT Assessment - 12/18/17 0001      Assessment   Medical Diagnosis  left shoulder full thickness tear.    Onset Date/Surgical Date  09/25/17    Next MD Visit  12/31/2017      Restrictions   Weight Bearing Restrictions  No                   [facility] Adult PT Treatment/Exercise - 12/18/17 0001      Shoulder Exercises: Seated   Flexion  AROM;Both;20 reps reclined for lawnchair progression    Diagonals  AROM;Left;20 reps reclined for lawnchair progression      Shoulder Exercises: Sidelying  External Rotation  Strengthening;Left;20 reps;Weights    External Rotation Weight (lbs)  1    Flexion  Strengthening;Left;15 reps;Weights    Flexion Weight (lbs)  1      Shoulder Exercises: Pulleys   Flexion  5 minutes    Other Pulley Exercises  LUE wall slides x20 reps      Shoulder Exercises: ROM/Strengthening   Wall Wash  CW and CCW x30 reps      Modalities   Modalities  Electrical Stimulation;Vasopneumatic      Acupuncturist Location  L shoulder     Electrical Stimulation Action  IFC    Electrical Stimulation Parameters  80-150 hz x15 min    Electrical Stimulation Goals  Pain      Vasopneumatic   Number Minutes Vasopneumatic   15 minutes    Vasopnuematic Location   Shoulder    Vasopneumatic Pressure  Low    Vasopneumatic Temperature   34               PT Short Term Goals - 11/06/17 1508      PT SHORT TERM GOAL #1   Title  STG's=LTG's.         PT Long Term Goals - 11/20/17 0910      PT LONG TERM GOAL #1   Title  Independent with a HEP.    Time  4    Period  Weeks    Status  Achieved      PT LONG TERM GOAL #2   Title  Active left shoulder flexion to 145 degrees so the patient can easily reach overhead.    Time  4    Period  Weeks    Status  On-going      PT LONG TERM GOAL #3   Title  Active ER to 70 degrees+ to allow for easily donning/doffing of apparel.    Time  4    Period  Weeks    Status  On-going      PT LONG TERM GOAL #4   Title  Increase ROM so patient is able to reach behind back.    Time  4    Period  Weeks    Status  On-going      PT LONG TERM GOAL #5   Title  Increase left shoulder strength to a solid 4+/5 to increase stability for performance of functional activities.    Time  4    Period  Weeks    Status  On-going      PT LONG TERM GOAL #6   Title  Perform ADL's with pain not > 3/10.    Status  On-going            Plan - 12/18/17 0850    Clinical Impression Statement  Patient tolerated today's treatment well as he was progressed with resistance and progressed in more antigravity position. Patient presented in clinic with very minimal L shoulder pain but began to report discomfort in L shoulder following lawnchair flexion and D2 strengthening. Patient demonstrated good technique with L shoulder SL ER with 1# but limited by fatigue. Patient more limited with 1# SL flexion secondary to fatigue. Normal modalities response noted following removal of the modalities.    Rehab Potential  Excellent    PT Frequency  3x / week    PT Duration  4 weeks    PT Treatment/Interventions  ADLs/Self Care Home Management;Cryotherapy;Electrical Stimulation;Ultrasound;Moist Heat;Therapeutic activities;Therapeutic exercise;Patient/family education;Neuromuscular re-education;Manual techniques;Passive range of  motion;Vasopneumatic Device    PT Next Visit Plan  Begin more strengthening next treatment per MPT POC.     Consulted and Agree with Plan of Care  Patient       Patient will benefit from skilled therapeutic intervention in order to improve the following deficits and impairments:  Decreased activity tolerance, Decreased range of motion, Pain  Visit Diagnosis: Chronic left shoulder pain  Stiffness of left shoulder, not elsewhere classified     Problem List Patient Active Problem List   Diagnosis Date Noted  . Chronic night sweats 07/31/2016  . DDD (degenerative disc disease), cervical 07/29/2015  . Prediabetes 06/30/2013  . Erectile dysfunction 06/30/2013  . Routine general medical examination at a health care facility 04/12/2011  . ESOPHAGEAL STRICTURE 12/14/2008  . Gastritis and gastroduodenitis 12/14/2008  . BPH associated with nocturia 12/03/2008  . Osteoarthrosis, hand 12/03/2008  . Hyperlipidemia with target LDL less than 130 09/28/2008    Standley Brooking, PTA 12/18/2017, 9:08 AM  [facility] [address] [place], [place], [address] Phone: [phone]   Fax:  [phone]  Name: BREK REECE MRN: [id number] Date of Birth: 11-13-1940

## 2017-12-20 ENCOUNTER — Encounter: Payer: Self-pay | Admitting: Physical Therapy

## 2017-12-20 ENCOUNTER — Ambulatory Visit: Payer: Medicare Other | Admitting: Physical Therapy

## 2017-12-20 DIAGNOSIS — G8929 Other chronic pain: Secondary | ICD-10-CM

## 2017-12-20 DIAGNOSIS — M25612 Stiffness of left shoulder, not elsewhere classified: Secondary | ICD-10-CM | POA: Diagnosis not present

## 2017-12-20 DIAGNOSIS — M25512 Pain in left shoulder: Principal | ICD-10-CM

## 2017-12-20 NOTE — Therapy (Signed)
[facility] [place], [place], [address] Phone: [phone]   Fax:  [phone]  Physical Therapy Treatment  Patient Details  Name: Don Johnson MRN: [id number] Date of Birth: 02-09-1941 Referring Provider: Bard Herbert MD   Encounter Date: 12/20/2017  PT End of Session - 12/20/17 0817    Visit Number  14    Number of Visits  24    Date for PT Re-Evaluation  12/31/17    Authorization Type  FOTO AT LEAST EVERY 5TH VISIT.    PT Start Time  [phone]    PT Stop Time  0858    PT Time Calculation (min)  42 min    Activity Tolerance  Patient tolerated treatment well    Behavior During Therapy  [facility] for tasks assessed/performed       Past Medical History:  Diagnosis Date  . Bilateral recurrent inguinal hernia   . BPH with obstruction/lower urinary tract symptoms   . Cough 09/20/2016  . DDD (degenerative disc disease), cervical   . Diverticulosis of colon   . GERD (gastroesophageal reflux disease)   . Hiatal hernia   . History of adenomatous polyp of colon    2006 tubular adenoma;  2010 tubular adenoma and hyperplastic  . Hyperlipidemia   . OA (osteoarthritis)    hands  . Organic sexual dysfunction   . Pre-diabetes   . S/P dilatation of esophageal stricture    multiple times --  last time w/ balloon 12-31-2015  . Wears dentures    upper denture, lower partial  . Wears glasses     Past Surgical History:  Procedure Laterality Date  . COLONOSCOPY  last one 03-14-2011  . ESOPHAGOGASTRODUODENOSCOPY (EGD) WITH ESOPHAGEAL DILATION  last one 12-31-2015  . HEMORROIDECTOMY  1970's  . INGUINAL HERNIA REPAIR Bilateral 1980's  . PENILE PROSTHESIS IMPLANT N/A 09/29/2016   Procedure: PENILE PROTHESIS INFLATABLE;  Surgeon: Carolan Clines, MD;  [facility]  Service: Urology;  Laterality: N/A;  . UMBILICAL HERNIA REPAIR  2007 approx    There were no vitals filed for this visit.  Subjective  Assessment - 12/20/17 0816    Subjective  Reports that diagonals are more uncomfortable.    Pertinent History  Cervical disc disease; OA.    Patient Stated Goals  Use left arm again.    Currently in Pain?  Yes    Pain Score  1     Pain Location  Shoulder    Pain Orientation  Left    Pain Descriptors / Indicators  Discomfort    Pain Type  Surgical pain    Pain Onset  More than a month ago    Pain Frequency  Intermittent         [facility] PT Assessment - 12/20/17 0001      Assessment   Medical Diagnosis  left shoulder full thickness tear.    Onset Date/Surgical Date  09/25/17    Next MD Visit  12/31/2017      Restrictions   Weight Bearing Restrictions  No                   [facility] Adult PT Treatment/Exercise - 12/20/17 0001      Shoulder Exercises: Seated   Flexion  AROM;Both;20 reps Reclined for lawnchair    Diagonals  AROM;Left;12 reps D1 x15 reps but limited with D2 today      Shoulder Exercises: Prone   Retraction  AROM;Left;20 reps    Extension  AROM;Left;20 reps    Horizontal ABduction 1  AROM;Left;20 reps      Shoulder Exercises: Sidelying   External Rotation  Strengthening;Left;20 reps;Weights    External Rotation Weight (lbs)  1    Flexion  AROM;Left;20 reps      Shoulder Exercises: Pulleys   Flexion  5 minutes    Other Pulley Exercises  LUE wall slides x30 reps      Shoulder Exercises: ROM/Strengthening   Wall Wash  CW and CCW x30 reps      Modalities   Modalities  Electrical Stimulation;Vasopneumatic      Electrical Stimulation   Electrical Stimulation Location  L shoulder     Electrical Stimulation Action  IFC    Electrical Stimulation Parameters  1-10 hz x15 min    Electrical Stimulation Goals  Pain      Vasopneumatic   Number Minutes Vasopneumatic   15 minutes    Vasopnuematic Location   Shoulder    Vasopneumatic Pressure  Medium    Vasopneumatic Temperature   34               PT Short Term Goals - 11/06/17 1508      PT  SHORT TERM GOAL #1   Title  STG's=LTG's.        PT Long Term Goals - 11/20/17 0910      PT LONG TERM GOAL #1   Title  Independent with a HEP.    Time  4    Period  Weeks    Status  Achieved      PT LONG TERM GOAL #2   Title  Active left shoulder flexion to 145 degrees so the patient can easily reach overhead.    Time  4    Period  Weeks    Status  On-going      PT LONG TERM GOAL #3   Title  Active ER to 70 degrees+ to allow for easily donning/doffing of apparel.    Time  4    Period  Weeks    Status  On-going      PT LONG TERM GOAL #4   Title  Increase ROM so patient is able to reach behind back.    Time  4    Period  Weeks    Status  On-going      PT LONG TERM GOAL #5   Title  Increase left shoulder strength to a solid 4+/5 to increase stability for performance of functional activities.    Time  4    Period  Weeks    Status  On-going      PT LONG TERM GOAL #6   Title  Perform ADL's with pain not > 3/10.    Status  On-going            Plan - 12/20/17 0845    Clinical Impression Statement  Patient tolerated today's treatment fairly well as he reported more discomfort with reclined L shoulder diagonals. Patient limited with diagonals secondary to posterior L shoulder pain (4/10). All other exercises the patient did not report any pain. Normal modalities response noted following removal of the modalities.       Rehab Potential  Excellent    PT Frequency  3x / week    PT Duration  4 weeks    PT Treatment/Interventions  ADLs/Self Care Home Management;Cryotherapy;Electrical Stimulation;Ultrasound;Moist Heat;Therapeutic activities;Therapeutic exercise;Patient/family education;Neuromuscular re-education;Manual techniques;Passive range of motion;Vasopneumatic Device    PT Next Visit Plan  Begin more strengthening  next treatment per MPT POC.    Consulted and Agree with Plan of Care  Patient       Patient will benefit from skilled therapeutic intervention in order to  improve the following deficits and impairments:  Decreased activity tolerance, Decreased range of motion, Pain  Visit Diagnosis: Chronic left shoulder pain  Stiffness of left shoulder, not elsewhere classified     Problem List Patient Active Problem List   Diagnosis Date Noted  . Chronic night sweats 07/31/2016  . DDD (degenerative disc disease), cervical 07/29/2015  . Prediabetes 06/30/2013  . Erectile dysfunction 06/30/2013  . Routine general medical examination at a health care facility 04/12/2011  . ESOPHAGEAL STRICTURE 12/14/2008  . Gastritis and gastroduodenitis 12/14/2008  . BPH associated with nocturia 12/03/2008  . Osteoarthrosis, hand 12/03/2008  . Hyperlipidemia with target LDL less than 130 09/28/2008    Standley Brooking, PTA 12/20/2017, 9:12 AM  [facility] [place], [place], [address] Phone: [phone]   Fax:  [phone]  Name: NIGEL ERICSSON MRN: [id number] Date of Birth: 1941/06/23

## 2017-12-25 ENCOUNTER — Ambulatory Visit: Payer: Medicare Other | Admitting: Physical Therapy

## 2017-12-25 ENCOUNTER — Encounter: Payer: Self-pay | Admitting: Physical Therapy

## 2017-12-25 DIAGNOSIS — G8929 Other chronic pain: Secondary | ICD-10-CM

## 2017-12-25 DIAGNOSIS — M25512 Pain in left shoulder: Secondary | ICD-10-CM | POA: Diagnosis not present

## 2017-12-25 DIAGNOSIS — M25612 Stiffness of left shoulder, not elsewhere classified: Secondary | ICD-10-CM

## 2017-12-25 NOTE — Therapy (Signed)
[facility] [place], [place], [address] Phone: [phone]   Fax:  [phone]  Physical Therapy Treatment  Patient Details  Name: Don Johnson MRN: [id number] Date of Birth: February 19, 1941 Referring Provider: Bard Herbert MD   Encounter Date: 12/25/2017  PT End of Session - 12/25/17 0817    Visit Number  15    Number of Visits  24    Date for PT Re-Evaluation  12/31/17    Authorization Type  FOTO AT LEAST EVERY 5TH VISIT.    PT Start Time  0815    PT Stop Time  0909    PT Time Calculation (min)  54 min    Activity Tolerance  Patient tolerated treatment well    Behavior During Therapy  [facility] for tasks assessed/performed       Past Medical History:  Diagnosis Date  . Bilateral recurrent inguinal hernia   . BPH with obstruction/lower urinary tract symptoms   . Cough 09/20/2016  . DDD (degenerative disc disease), cervical   . Diverticulosis of colon   . GERD (gastroesophageal reflux disease)   . Hiatal hernia   . History of adenomatous polyp of colon    2006 tubular adenoma;  2010 tubular adenoma and hyperplastic  . Hyperlipidemia   . OA (osteoarthritis)    hands  . Organic sexual dysfunction   . Pre-diabetes   . S/P dilatation of esophageal stricture    multiple times --  last time w/ balloon 12-31-2015  . Wears dentures    upper denture, lower partial  . Wears glasses     Past Surgical History:  Procedure Laterality Date  . COLONOSCOPY  last one 03-14-2011  . ESOPHAGOGASTRODUODENOSCOPY (EGD) WITH ESOPHAGEAL DILATION  last one 12-31-2015  . HEMORROIDECTOMY  1970's  . INGUINAL HERNIA REPAIR Bilateral 1980's  . PENILE PROSTHESIS IMPLANT N/A 09/29/2016   Procedure: PENILE PROTHESIS INFLATABLE;  Surgeon: Carolan Clines, MD;  Location: [facility];  Service: Urology;  Laterality: N/A;  . UMBILICAL HERNIA REPAIR  2007 approx    There were no vitals filed for this visit.  Subjective  Assessment - 12/25/17 0817    Subjective  Reports he sees the MD again next week. Reports he has been sleeping in the bed for the past week and tries to stay off his L shoulder.    Pertinent History  Cervical disc disease; OA.    Patient Stated Goals  Use left arm again.    Currently in Pain?  No/denies         [facility] PT Assessment - 12/25/17 0001      Assessment   Medical Diagnosis  left shoulder full thickness tear.    Onset Date/Surgical Date  09/25/17    Next MD Visit  12/31/2017      Restrictions   Weight Bearing Restrictions  No                   [facility] Adult PT Treatment/Exercise - 12/25/17 0001      Shoulder Exercises: Seated   Flexion  AROM;Both;20 reps reclined      Shoulder Exercises: Prone   Retraction  AROM;Left;20 reps    Extension  AROM;Left;20 reps    Horizontal ABduction 1  AROM;Left;20 reps      Shoulder Exercises: Sidelying   External Rotation  Strengthening;Left;20 reps;Weights    External Rotation Weight (lbs)  1    Flexion  AROM;Left;20 reps      Shoulder Exercises: Standing  Protraction  Strengthening;Left;20 reps;Theraband    Theraband Level (Shoulder Protraction)  Level 1 (Yellow)    External Rotation  Strengthening;Left;20 reps;Theraband    Theraband Level (Shoulder External Rotation)  Level 1 (Yellow)    Internal Rotation  Strengthening;Left;20 reps;Theraband    Theraband Level (Shoulder Internal Rotation)  Level 1 (Yellow)    Extension  Strengthening;Left;20 reps;Theraband    Theraband Level (Shoulder Extension)  Level 1 (Yellow)    Row  Strengthening;Left;20 reps;Theraband    Theraband Level (Shoulder Row)  Level 1 (Yellow)      Shoulder Exercises: Pulleys   Other Pulley Exercises  LUE wall slides x30 reps      Shoulder Exercises: ROM/Strengthening   UBE (Upper Arm Bike)  120 RPM x6 min (3 m backward, 3 m forward)    Wall Wash  CW and CCW x30 reps      Modalities   Modalities  Risk manager Location  L shoulder    Electrical Stimulation Action  IFC    Electrical Stimulation Parameters  1-10 hz x15 min    Electrical Stimulation Goals  Pain      Vasopneumatic   Number Minutes Vasopneumatic   15 minutes    Vasopnuematic Location   Shoulder    Vasopneumatic Pressure  Medium    Vasopneumatic Temperature   34             PT Education - 12/25/17 0901    Education provided  Yes    Education Details  HEP- RW4 with yellow theraband    Person(s) Educated  Patient    Methods  Explanation;Handout    Comprehension  Verbalized understanding       PT Short Term Goals - 11/06/17 1508      PT SHORT TERM GOAL #1   Title  STG's=LTG's.        PT Long Term Goals - 11/20/17 0910      PT LONG TERM GOAL #1   Title  Independent with a HEP.    Time  4    Period  Weeks    Status  Achieved      PT LONG TERM GOAL #2   Title  Active left shoulder flexion to 145 degrees so the patient can easily reach overhead.    Time  4    Period  Weeks    Status  On-going      PT LONG TERM GOAL #3   Title  Active ER to 70 degrees+ to allow for easily donning/doffing of apparel.    Time  4    Period  Weeks    Status  On-going      PT LONG TERM GOAL #4   Title  Increase ROM so patient is able to reach behind back.    Time  4    Period  Weeks    Status  On-going      PT LONG TERM GOAL #5   Title  Increase left shoulder strength to a solid 4+/5 to increase stability for performance of functional activities.    Time  4    Period  Weeks    Status  On-going      PT LONG TERM GOAL #6   Title  Perform ADL's with pain not > 3/10.    Status  On-going            Plan - 12/25/17 0905    Clinical Impression Statement  Patient tolerated today's treatment with introduction of light L shoulder strengthening with only complaint of fatigue. Patient required demo and VCs as well as tactile cues for proper RW4 technique. Patient demontrated  fatigue with exercises against gravity in reclined position and SL. Patient provided HEP for RW4 with yellow theraband with education regarding parameters and technique reminders. Normal modalities response noted following removal of the modalities.    Rehab Potential  Excellent    PT Frequency  3x / week    PT Duration  4 weeks    PT Treatment/Interventions  ADLs/Self Care Home Management;Cryotherapy;Electrical Stimulation;Ultrasound;Moist Heat;Therapeutic activities;Therapeutic exercise;Patient/family education;Neuromuscular re-education;Manual techniques;Passive range of motion;Vasopneumatic Device    PT Next Visit Plan  Continue light strengthening per MPT POC.    PT Home Exercise Plan  HEP- RW4 with yellow theraband    Consulted and Agree with Plan of Care  Patient       Patient will benefit from skilled therapeutic intervention in order to improve the following deficits and impairments:  Decreased activity tolerance, Decreased range of motion, Pain  Visit Diagnosis: Chronic left shoulder pain  Stiffness of left shoulder, not elsewhere classified     Problem List Patient Active Problem List   Diagnosis Date Noted  . Chronic night sweats 07/31/2016  . DDD (degenerative disc disease), cervical 07/29/2015  . Prediabetes 06/30/2013  . Erectile dysfunction 06/30/2013  . Routine general medical examination at a health care facility 04/12/2011  . ESOPHAGEAL STRICTURE 12/14/2008  . Gastritis and gastroduodenitis 12/14/2008  . BPH associated with nocturia 12/03/2008  . Osteoarthrosis, hand 12/03/2008  . Hyperlipidemia with target LDL less than 130 09/28/2008    Standley Brooking, PTA 12/25/2017, 9:15 AM  [facility] [address] [place], [place], [address] Phone: [phone]   Fax:  [phone]  Name: DECLAN MIER MRN: [id number] Date of Birth: [birth year]

## 2017-12-27 ENCOUNTER — Ambulatory Visit: Payer: Medicare Other | Admitting: Physical Therapy

## 2017-12-27 ENCOUNTER — Encounter: Payer: Self-pay | Admitting: Physical Therapy

## 2017-12-27 DIAGNOSIS — M25512 Pain in left shoulder: Principal | ICD-10-CM

## 2017-12-27 DIAGNOSIS — M25612 Stiffness of left shoulder, not elsewhere classified: Secondary | ICD-10-CM

## 2017-12-27 DIAGNOSIS — G8929 Other chronic pain: Secondary | ICD-10-CM | POA: Diagnosis not present

## 2017-12-27 NOTE — Therapy (Signed)
[facility] [place], [place], [address] Phone: [phone]   Fax:  [phone]  Physical Therapy Treatment  Patient Details  Name: Don Johnson MRN: [id number] Date of Birth: 1940/11/11 Referring Provider: Bard Herbert MD   Encounter Date: 12/27/2017  PT End of Session - 12/27/17 0823    Visit Number  16    Number of Visits  24    Date for PT Re-Evaluation  12/31/17    Authorization Type  FOTO AT LEAST EVERY 5TH VISIT.    PT Start Time  0818    PT Stop Time  0904    PT Time Calculation (min)  46 min    Activity Tolerance  Patient tolerated treatment well    Behavior During Therapy  [facility] for tasks assessed/performed       Past Medical History:  Diagnosis Date  . Bilateral recurrent inguinal hernia   . BPH with obstruction/lower urinary tract symptoms   . Cough 09/20/2016  . DDD (degenerative disc disease), cervical   . Diverticulosis of colon   . GERD (gastroesophageal reflux disease)   . Hiatal hernia   . History of adenomatous polyp of colon    2006 tubular adenoma;  2010 tubular adenoma and hyperplastic  . Hyperlipidemia   . OA (osteoarthritis)    hands  . Organic sexual dysfunction   . Pre-diabetes   . S/P dilatation of esophageal stricture    multiple times --  last time w/ balloon 12-31-2015  . Wears dentures    upper denture, lower partial  . Wears glasses     Past Surgical History:  Procedure Laterality Date  . COLONOSCOPY  last one 03-14-2011  . ESOPHAGOGASTRODUODENOSCOPY (EGD) WITH ESOPHAGEAL DILATION  last one 12-31-2015  . HEMORROIDECTOMY  1970's  . INGUINAL HERNIA REPAIR Bilateral 1980's  . PENILE PROSTHESIS IMPLANT N/A 09/29/2016   Procedure: PENILE PROTHESIS INFLATABLE;  Surgeon: Carolan Clines, MD;  Location: [facility];  Service: Urology;  Laterality: N/A;  . UMBILICAL HERNIA REPAIR  2007 approx    There were no vitals filed for this visit.  Subjective  Assessment - 12/27/17 0822    Subjective  Reports soreness from previous treatment. Reports that he has been doing the exercises at home and he used ice yesterday at home.    Pertinent History  Cervical disc disease; OA.    Patient Stated Goals  Use left arm again.    Currently in Pain?  Yes    Pain Score  2     Pain Location  Shoulder    Pain Orientation  Left    Pain Descriptors / Indicators  Sore;Aching    Pain Type  Surgical pain    Pain Onset  More than a month ago         [facility] PT Assessment - 12/27/17 0001      Assessment   Medical Diagnosis  left shoulder full thickness tear.    Onset Date/Surgical Date  09/25/17    Next MD Visit  12/31/2017      Restrictions   Weight Bearing Restrictions  No      ROM / Strength   AROM / PROM / Strength  AROM;Strength      AROM   Overall AROM   Deficits    AROM Assessment Site  Shoulder    Right/Left Shoulder  Left    Left Shoulder Flexion  126 Degrees 95 deg in sitting    Left Shoulder Internal Rotation  69 Degrees    Left Shoulder External Rotation  67 Degrees      Strength   Overall Strength  Deficits    Strength Assessment Site  Shoulder    Right/Left Shoulder  Left    Left Shoulder Flexion  4-/5    Left Shoulder Internal Rotation  4+/5    Left Shoulder External Rotation  4-/5                   [facility] Adult PT Treatment/Exercise - 12/27/17 0001      Shoulder Exercises: Supine   Flexion  AROM;Left;20 reps      Shoulder Exercises: Prone   Retraction  Strengthening;Left;20 reps;Weights    Retraction Weight (lbs)  1    Extension  Strengthening;Left;20 reps;Weights    Extension Weight (lbs)  1    Horizontal ABduction 1  Strengthening;Left;20 reps;Weights    Horizontal ABduction 1 Weight (lbs)  1      Shoulder Exercises: Sidelying   External Rotation  Strengthening;Left;20 reps;Weights    External Rotation Weight (lbs)  1      Shoulder Exercises: Standing   Protraction  Strengthening;Left;20  reps;Theraband    Theraband Level (Shoulder Protraction)  Level 1 (Yellow)    External Rotation  Strengthening;Left;20 reps;Theraband    Theraband Level (Shoulder External Rotation)  Level 1 (Yellow)    Internal Rotation  Strengthening;Left;20 reps;Theraband    Theraband Level (Shoulder Internal Rotation)  Level 1 (Yellow)    Extension  Strengthening;Left;20 reps;Theraband    Theraband Level (Shoulder Extension)  Level 1 (Yellow)    Row  Strengthening;Left;20 reps;Theraband    Theraband Level (Shoulder Row)  Level 1 (Yellow)    Other Standing Exercises  Ball taps into flexion x20 reps      Shoulder Exercises: ROM/Strengthening   UBE (Upper Arm Bike)  120 RPM x6 min (3 m backward, 3 m forward)      Modalities   Modalities  Psychologist, educational Location  L shoulder    Electrical Stimulation Action  IFC    Electrical Stimulation Parameters  1-10 hz x15 min    Electrical Stimulation Goals  Pain      Vasopneumatic   Number Minutes Vasopneumatic   15 minutes    Vasopnuematic Location   Shoulder    Vasopneumatic Pressure  Medium    Vasopneumatic Temperature   34               PT Short Term Goals - 11/06/17 1508      PT SHORT TERM GOAL #1   Title  STG's=LTG's.        PT Long Term Goals - 12/27/17 4970      PT LONG TERM GOAL #1   Title  Independent with a HEP.    Time  4    Period  Weeks    Status  Achieved      PT LONG TERM GOAL #2   Title  Active left shoulder flexion to 145 degrees so the patient can easily reach overhead.    Time  4    Period  Weeks    Status  On-going      PT LONG TERM GOAL #3   Title  Active ER to 70 degrees+ to allow for easily donning/doffing of apparel.    Time  4    Period  Weeks    Status  On-going      PT LONG TERM  GOAL #4   Title  Increase ROM so patient is able to reach behind back.    Time  4    Period  Weeks    Status  On-going      PT LONG TERM GOAL  #5   Title  Increase left shoulder strength to a solid 4+/5 to increase stability for performance of functional activities.    Time  4    Period  Weeks    Status  On-going      PT LONG TERM GOAL #6   Title  Perform ADL's with pain not > 3/10.    Status  Achieved            Plan - 12/27/17 0857    Clinical Impression Statement  Patient tolerated today's treatment fairly well as he was more sore from initiating resisted strengthening in previous treatment. Light strengthening with yellow theraband and 1# hand weights completed today. Patient required VCs and minimal demo for proper exercise technique in regards to [facility]. AROM L shoulder flexion deficient against gravity and L shoulder MMT deficient as well in all directions. Normal modalities response noted following removal of the modalities. Goals remain on-going secondary to strength and ROM deficiency.    Rehab Potential  Excellent    PT Frequency  3x / week    PT Duration  4 weeks    PT Treatment/Interventions  ADLs/Self Care Home Management;Cryotherapy;Electrical Stimulation;Ultrasound;Moist Heat;Therapeutic activities;Therapeutic exercise;Patient/family education;Neuromuscular re-education;Manual techniques;Passive range of motion;Vasopneumatic Device    PT Next Visit Plan  Continue light strengthening per MPT POC.    PT Home Exercise Plan  HEP- RW4 with yellow theraband    Consulted and Agree with Plan of Care  Patient       Patient will benefit from skilled therapeutic intervention in order to improve the following deficits and impairments:  Decreased activity tolerance, Decreased range of motion, Pain  Visit Diagnosis: Chronic left shoulder pain  Stiffness of left shoulder, not elsewhere classified     Problem List Patient Active Problem List   Diagnosis Date Noted  . Chronic night sweats 07/31/2016  . DDD (degenerative disc disease), cervical 07/29/2015  . Prediabetes 06/30/2013  . Erectile dysfunction 06/30/2013   . Routine general medical examination at a health care facility 04/12/2011  . ESOPHAGEAL STRICTURE 12/14/2008  . Gastritis and gastroduodenitis 12/14/2008  . BPH associated with nocturia 12/03/2008  . Osteoarthrosis, hand 12/03/2008  . Hyperlipidemia with target LDL less than 130 09/28/2008    Standley Brooking, PTA 12/27/17 9:06 AM ' [facility] [place], [place], [address] Phone: [phone]   Fax:  [phone]  Name: CLARK CUFF MRN: [id number] Date of Birth: [birth year]

## 2018-01-01 ENCOUNTER — Ambulatory Visit: Payer: Medicare Other | Admitting: *Deleted

## 2018-01-01 DIAGNOSIS — M25612 Stiffness of left shoulder, not elsewhere classified: Secondary | ICD-10-CM

## 2018-01-01 DIAGNOSIS — M25512 Pain in left shoulder: Secondary | ICD-10-CM | POA: Diagnosis not present

## 2018-01-01 DIAGNOSIS — G8929 Other chronic pain: Secondary | ICD-10-CM | POA: Diagnosis not present

## 2018-01-01 NOTE — Therapy (Signed)
[facility] [place], [place], [address] Phone: [phone]   Fax:  [phone]  Physical Therapy Treatment  Patient Details  Name: Don Johnson MRN: [id number] Date of Birth: [birth year] Referring Provider: Bard Herbert MD   Encounter Date: 01/01/2018  PT End of Session - 01/01/18 0829    Visit Number  17    Number of Visits  24    Date for PT Re-Evaluation  12/31/17    Authorization Type  FOTO AT LEAST EVERY 5TH VISIT.    PT Start Time  [phone]    PT Stop Time  0915    PT Time Calculation (min)  58 min       Past Medical History:  Diagnosis Date  . Bilateral recurrent inguinal hernia   . BPH with obstruction/lower urinary tract symptoms   . Cough 09/20/2016  . DDD (degenerative disc disease), cervical   . Diverticulosis of colon   . GERD (gastroesophageal reflux disease)   . Hiatal hernia   . History of adenomatous polyp of colon    2006 tubular adenoma;  2010 tubular adenoma and hyperplastic  . Hyperlipidemia   . OA (osteoarthritis)    hands  . Organic sexual dysfunction   . Pre-diabetes   . S/P dilatation of esophageal stricture    multiple times --  last time w/ balloon 12-31-2015  . Wears dentures    upper denture, lower partial  . Wears glasses     Past Surgical History:  Procedure Laterality Date  . COLONOSCOPY  last one 03-14-2011  . ESOPHAGOGASTRODUODENOSCOPY (EGD) WITH ESOPHAGEAL DILATION  last one 12-31-2015  . HEMORROIDECTOMY  1970's  . INGUINAL HERNIA REPAIR Bilateral 1980's  . PENILE PROSTHESIS IMPLANT N/A 09/29/2016   Procedure: PENILE PROTHESIS INFLATABLE;  Surgeon: Carolan Clines, MD;  Location: [facility];  Service: Urology;  Laterality: N/A;  . UMBILICAL HERNIA REPAIR  2007 approx    There were no vitals filed for this visit.                    [facility] Adult PT Treatment/Exercise - 01/01/18 0001      Shoulder Exercises: Supine   Flexion   AROM;Left;20 reps      Shoulder Exercises: Prone   Retraction  Strengthening;Left;20 reps;Weights    Retraction Weight (lbs)  1    Extension  Strengthening;Left;20 reps;Weights    Extension Weight (lbs)  1    Horizontal ABduction 1  Strengthening;Left;20 reps;Weights      Shoulder Exercises: Sidelying   External Rotation  Strengthening;Left;20 reps;Weights    Flexion  AROM;Left;20 reps      Shoulder Exercises: Standing   Protraction  Strengthening;Left;20 reps;Theraband    Theraband Level (Shoulder Protraction)  Level 1 (Yellow)    External Rotation  Strengthening;Left;20 reps;Theraband    Theraband Level (Shoulder External Rotation)  Level 1 (Yellow)    Internal Rotation  Strengthening;Left;20 reps;Theraband    Theraband Level (Shoulder Internal Rotation)  Level 1 (Yellow)    Extension  Strengthening;Left;20 reps;Theraband    Theraband Level (Shoulder Extension)  Level 1 (Yellow)    Row  Strengthening;Left;20 reps;Theraband    Theraband Level (Shoulder Row)  Level 1 (Yellow)      Shoulder Exercises: Pulleys   Flexion  3 minutes      Shoulder Exercises: ROM/Strengthening   UBE (Upper Arm Bike)  120 RPM x6 min (3 m backward, 3 m forward)      Modalities   Modalities  Psychologist, educational Location  L shoulder IFC 1-10hz  x 15 mins    Electrical Stimulation Goals  Pain      Vasopneumatic   Number Minutes Vasopneumatic   15 minutes    Vasopnuematic Location   Shoulder    Vasopneumatic Pressure  Medium    Vasopneumatic Temperature   34               PT Short Term Goals - 11/06/17 1508      PT SHORT TERM GOAL #1   Title  STG's=LTG's.        PT Long Term Goals - 12/27/17 4580      PT LONG TERM GOAL #1   Title  Independent with a HEP.    Time  4    Period  Weeks    Status  Achieved      PT LONG TERM GOAL #2   Title  Active left shoulder flexion to 145 degrees so the patient can easily  reach overhead.    Time  4    Period  Weeks    Status  On-going      PT LONG TERM GOAL #3   Title  Active ER to 70 degrees+ to allow for easily donning/doffing of apparel.    Time  4    Period  Weeks    Status  On-going      PT LONG TERM GOAL #4   Title  Increase ROM so patient is able to reach behind back.    Time  4    Period  Weeks    Status  On-going      PT LONG TERM GOAL #5   Title  Increase left shoulder strength to a solid 4+/5 to increase stability for performance of functional activities.    Time  4    Period  Weeks    Status  On-going      PT LONG TERM GOAL #6   Title  Perform ADL's with pain not > 3/10.    Status  Achieved            Plan - 01/01/18 0914    Clinical Impression Statement  Pt arrived today doing fairly well and mainly just sore. He was able to perform all Therex for AROM and strengthening for light LT shldr. RW 5 performed with yellow tband and pt needed cues for technique again for HEP. He did well with sidelying flexion, but challenged  with standing elevation. Normal modality response    Clinical Presentation  Stable    Rehab Potential  Excellent    PT Frequency  3x / week    PT Duration  4 weeks    PT Treatment/Interventions  ADLs/Self Care Home Management;Cryotherapy;Electrical Stimulation;Ultrasound;Moist Heat;Therapeutic activities;Therapeutic exercise;Patient/family education;Neuromuscular re-education;Manual techniques;Passive range of motion;Vasopneumatic Device    PT Next Visit Plan  Continue light strengthening per MPT POC.    PT Home Exercise Plan  HEP- RW4 with yellow theraband    Consulted and Agree with Plan of Care  Patient       Patient will benefit from skilled therapeutic intervention in order to improve the following deficits and impairments:  Decreased activity tolerance, Decreased range of motion, Pain  Visit Diagnosis: Chronic left shoulder pain  Stiffness of left shoulder, not elsewhere classified     Problem  List Patient Active Problem List   Diagnosis Date Noted  . Chronic night sweats 07/31/2016  .  DDD (degenerative disc disease), cervical 07/29/2015  . Prediabetes 06/30/2013  . Erectile dysfunction 06/30/2013  . Routine general medical examination at a health care facility 04/12/2011  . ESOPHAGEAL STRICTURE 12/14/2008  . Gastritis and gastroduodenitis 12/14/2008  . BPH associated with nocturia 12/03/2008  . Osteoarthrosis, hand 12/03/2008  . Hyperlipidemia with target LDL less than 130 09/28/2008    Desma Wilkowski,CHRIS, PTA 01/01/2018, 9:20 AM  [facility] [place], [place], [address] Phone: [phone]   Fax:  [phone]  Name: DONG NIMMONS MRN: [id number] Date of Birth: 1941-05-09

## 2018-01-03 ENCOUNTER — Encounter: Payer: Self-pay | Admitting: Physical Therapy

## 2018-01-03 ENCOUNTER — Ambulatory Visit: Payer: Medicare Other | Admitting: Physical Therapy

## 2018-01-03 DIAGNOSIS — G8929 Other chronic pain: Secondary | ICD-10-CM | POA: Diagnosis not present

## 2018-01-03 DIAGNOSIS — M25612 Stiffness of left shoulder, not elsewhere classified: Secondary | ICD-10-CM

## 2018-01-03 DIAGNOSIS — M25512 Pain in left shoulder: Secondary | ICD-10-CM | POA: Diagnosis not present

## 2018-01-03 NOTE — Therapy (Signed)
[facility] [place], [place], [address] Phone: [phone]   Fax:  [phone]  Physical Therapy Treatment  Patient Details  Name: Don Johnson MRN: [id number] Date of Birth: 1941/03/11 Referring Provider: Bard Herbert MD   Encounter Date: 01/03/2018  PT End of Session - 01/03/18 0821    Visit Number  18    Number of Visits  24    Date for PT Re-Evaluation  12/31/17    Authorization Type  FOTO AT LEAST EVERY 5TH VISIT.    PT Start Time  0817    PT Stop Time  0909    PT Time Calculation (min)  52 min    Activity Tolerance  Patient tolerated treatment well    Behavior During Therapy  [facility] for tasks assessed/performed       Past Medical History:  Diagnosis Date  . Bilateral recurrent inguinal hernia   . BPH with obstruction/lower urinary tract symptoms   . Cough 09/20/2016  . DDD (degenerative disc disease), cervical   . Diverticulosis of colon   . GERD (gastroesophageal reflux disease)   . Hiatal hernia   . History of adenomatous polyp of colon    2006 tubular adenoma;  2010 tubular adenoma and hyperplastic  . Hyperlipidemia   . OA (osteoarthritis)    hands  . Organic sexual dysfunction   . Pre-diabetes   . S/P dilatation of esophageal stricture    multiple times --  last time w/ balloon 12-31-2015  . Wears dentures    upper denture, lower partial  . Wears glasses     Past Surgical History:  Procedure Laterality Date  . COLONOSCOPY  last one 03-14-2011  . ESOPHAGOGASTRODUODENOSCOPY (EGD) WITH ESOPHAGEAL DILATION  last one 12-31-2015  . HEMORROIDECTOMY  1970's  . INGUINAL HERNIA REPAIR Bilateral 1980's  . PENILE PROSTHESIS IMPLANT N/A 09/29/2016   Procedure: PENILE PROTHESIS INFLATABLE;  Surgeon: Carolan Clines, MD;  Location: [facility];  Service: Urology;  Laterality: N/A;  . UMBILICAL HERNIA REPAIR  2007 approx    There were no vitals filed for this visit.  Subjective  Assessment - 01/03/18 0820    Subjective  Reports that "there's a little something there this morning." Reports that he had a bump come up along one of the incisions and was mashed yesterday and water came out, mashed today and a little blood came out. Reports using his TENS unit at home but like it to have increased intensity.    Pertinent History  Cervical disc disease; OA.    Patient Stated Goals  Use left arm again.    Currently in Pain?  Yes    Pain Score  1     Pain Location  Shoulder    Pain Orientation  Left    Pain Descriptors / Indicators  Discomfort    Pain Type  Surgical pain    Pain Onset  More than a month ago    Pain Frequency  Intermittent         [facility] PT Assessment - 01/03/18 0001      Assessment   Medical Diagnosis  left shoulder full thickness tear.    Onset Date/Surgical Date  09/25/17    Next MD Visit  01/30/2018      Restrictions   Weight Bearing Restrictions  No                   [facility] Adult PT Treatment/Exercise - 01/03/18 0001  Shoulder Exercises: Supine   Protraction  Strengthening;Left;20 reps;Weights    Protraction Weight (lbs)  2      Shoulder Exercises: Seated   Flexion  AROM;Both;20 reps      Shoulder Exercises: Prone   Retraction  Strengthening;Left;20 reps;Weights    Retraction Weight (lbs)  2    Extension  Strengthening;Left;20 reps;Weights    Extension Weight (lbs)  2    Horizontal ABduction 1  Strengthening;Left;20 reps;Weights    Horizontal ABduction 1 Weight (lbs)  2      Shoulder Exercises: Sidelying   External Rotation  Strengthening;Left;20 reps;Weights    External Rotation Weight (lbs)  2      Shoulder Exercises: Standing   Protraction  Strengthening;Left;20 reps;Theraband    Theraband Level (Shoulder Protraction)  Level 1 (Yellow)    External Rotation  Strengthening;Left;20 reps;Theraband    Theraband Level (Shoulder External Rotation)  Level 1 (Yellow)    Internal Rotation  Strengthening;Left;20  reps;Theraband    Theraband Level (Shoulder Internal Rotation)  Level 1 (Yellow)    Extension  Strengthening;Left;20 reps;Theraband    Theraband Level (Shoulder Extension)  Level 1 (Yellow)    Row  Strengthening;Left;20 reps;Theraband    Theraband Level (Shoulder Row)  Level 1 (Yellow)    Other Standing Exercises  Ball taps into flexion 2# x20 reps      Shoulder Exercises: Pulleys   Flexion  Other (comment) x7 min      Shoulder Exercises: ROM/Strengthening   UBE (Upper Arm Bike)  60 RPM x6 min (3 m forward, 3 m backward)      Modalities   Modalities  Psychologist, educational Location  L shoulder    Electrical Stimulation Action  Pre-Mod    Electrical Stimulation Parameters  80-150 hz x15 min    Electrical Stimulation Goals  Pain      Vasopneumatic   Number Minutes Vasopneumatic   15 minutes    Vasopnuematic Location   Shoulder    Vasopneumatic Pressure  Medium    Vasopneumatic Temperature   34               PT Short Term Goals - 11/06/17 1508      PT SHORT TERM GOAL #1   Title  STG's=LTG's.        PT Long Term Goals - 12/27/17 7035      PT LONG TERM GOAL #1   Title  Independent with a HEP.    Time  4    Period  Weeks    Status  Achieved      PT LONG TERM GOAL #2   Title  Active left shoulder flexion to 145 degrees so the patient can easily reach overhead.    Time  4    Period  Weeks    Status  On-going      PT LONG TERM GOAL #3   Title  Active ER to 70 degrees+ to allow for easily donning/doffing of apparel.    Time  4    Period  Weeks    Status  On-going      PT LONG TERM GOAL #4   Title  Increase ROM so patient is able to reach behind back.    Time  4    Period  Weeks    Status  On-going      PT LONG TERM GOAL #5   Title  Increase left shoulder strength to a solid 4+/5  to increase stability for performance of functional activities.    Time  4    Period  Weeks    Status   On-going      PT LONG TERM GOAL #6   Title  Perform ADL's with pain not > 3/10.    Status  Achieved            Plan - 01/03/18 0859    Clinical Impression Statement  Patient tolerated today's treatment well although he reported minimal discomfort. Bump reported by patient was observed over the superior incision portal with redness surrounding with scab over. Patient guided through exercises with increased handweights with only report of fatigue. Normal modalities response noted following removal of the modalities.    Rehab Potential  Excellent    PT Frequency  3x / week    PT Duration  4 weeks    PT Treatment/Interventions  ADLs/Self Care Home Management;Cryotherapy;Electrical Stimulation;Ultrasound;Moist Heat;Therapeutic activities;Therapeutic exercise;Patient/family education;Neuromuscular re-education;Manual techniques;Passive range of motion;Vasopneumatic Device    PT Next Visit Plan  Continue light strengthening per MPT POC.    PT Home Exercise Plan  HEP- RW4 with yellow theraband    Consulted and Agree with Plan of Care  Patient       Patient will benefit from skilled therapeutic intervention in order to improve the following deficits and impairments:  Decreased activity tolerance, Decreased range of motion, Pain  Visit Diagnosis: Chronic left shoulder pain  Stiffness of left shoulder, not elsewhere classified     Problem List Patient Active Problem List   Diagnosis Date Noted  . Chronic night sweats 07/31/2016  . DDD (degenerative disc disease), cervical 07/29/2015  . Prediabetes 06/30/2013  . Erectile dysfunction 06/30/2013  . Routine general medical examination at a health care facility 04/12/2011  . ESOPHAGEAL STRICTURE 12/14/2008  . Gastritis and gastroduodenitis 12/14/2008  . BPH associated with nocturia 12/03/2008  . Osteoarthrosis, hand 12/03/2008  . Hyperlipidemia with target LDL less than 130 09/28/2008    Standley Brooking, PTA 01/03/2018, 9:11  AM  [facility] [place], [place], [address] Phone: [phone]   Fax:  [phone]  Name: JULYAN GALES MRN: [id number] Date of Birth: [birth year]

## 2018-01-08 ENCOUNTER — Ambulatory Visit: Payer: Medicare Other | Admitting: *Deleted

## 2018-01-08 DIAGNOSIS — G8929 Other chronic pain: Secondary | ICD-10-CM | POA: Diagnosis not present

## 2018-01-08 DIAGNOSIS — M25512 Pain in left shoulder: Secondary | ICD-10-CM | POA: Diagnosis not present

## 2018-01-08 DIAGNOSIS — M25612 Stiffness of left shoulder, not elsewhere classified: Secondary | ICD-10-CM | POA: Diagnosis not present

## 2018-01-08 NOTE — Therapy (Signed)
[place], [place], [address] Phone: [phone]   Fax:  [phone]  Physical Therapy Treatment  Patient Details  Name: Don Johnson MRN: [id number] Date of Birth: 28-Jul-1941 Referring Provider: Bard Herbert MD   Encounter Date: 01/08/2018  PT End of Session - 01/08/18 0827    Visit Number  19    Number of Visits  24    Date for PT Re-Evaluation  12/31/17    Authorization Type  FOTO AT LEAST EVERY 5TH VISIT.    PT Start Time  0815    PT Stop Time  0914    PT Time Calculation (min)  59 min    Activity Tolerance  Patient tolerated treatment well    Behavior During Therapy  [facility] for tasks assessed/performed       Past Medical History:  Diagnosis Date  . Bilateral recurrent inguinal hernia   . BPH with obstruction/lower urinary tract symptoms   . Cough 09/20/2016  . DDD (degenerative disc disease), cervical   . Diverticulosis of colon   . GERD (gastroesophageal reflux disease)   . Hiatal hernia   . History of adenomatous polyp of colon    2006 tubular adenoma;  2010 tubular adenoma and hyperplastic  . Hyperlipidemia   . OA (osteoarthritis)    hands  . Organic sexual dysfunction   . Pre-diabetes   . S/P dilatation of esophageal stricture    multiple times --  last time w/ balloon 12-31-2015  . Wears dentures    upper denture, lower partial  . Wears glasses     Past Surgical History:  Procedure Laterality Date  . COLONOSCOPY  last one 03-14-2011  . ESOPHAGOGASTRODUODENOSCOPY (EGD) WITH ESOPHAGEAL DILATION  last one 12-31-2015  . HEMORROIDECTOMY  1970's  . INGUINAL HERNIA REPAIR Bilateral 1980's  . PENILE PROSTHESIS IMPLANT N/A 09/29/2016   Procedure: PENILE PROTHESIS INFLATABLE;  Surgeon: Carolan Clines, MD;  Location: [facility];  Service: Urology;  Laterality: N/A;  . UMBILICAL HERNIA REPAIR  2007 approx    There were no vitals filed for this visit.  Subjective  Assessment - 01/08/18 0826    Subjective  2/10 LT shldr. Just sore    Pertinent History  Cervical disc disease; OA.    Patient Stated Goals  Use left arm again.    Currently in Pain?  Yes    Pain Score  2     Pain Location  Shoulder    Pain Orientation  Left    Pain Descriptors / Indicators  Discomfort    Pain Type  Surgical pain    Pain Onset  More than a month ago                       [facility] Adult PT Treatment/Exercise - 01/08/18 0001      Shoulder Exercises: Supine   Protraction  Strengthening;Left;20 reps;Weights;10 reps    Protraction Weight (lbs)  2      Shoulder Exercises: Prone   Retraction  Strengthening;Left;20 reps;Weights;10 reps    Retraction Weight (lbs)  2    Extension  Strengthening;Left;20 reps;Weights;10 reps    Extension Weight (lbs)  2    Horizontal ABduction 1  Strengthening;Left;20 reps;Weights;10 reps    Horizontal ABduction 1 Weight (lbs)  1      Shoulder Exercises: Sidelying   External Rotation  Strengthening;Left;20 reps;Weights    External Rotation Weight (lbs)  2    Flexion  AROM;Left;20 reps;10  reps      Shoulder Exercises: Pulleys   Flexion  Other (comment) x7 min      Shoulder Exercises: ROM/Strengthening   UBE (Upper Arm Bike)  60 RPM x6 min (3 m forward, 3 m backward)      Modalities   Modalities  Electrical Stimulation;Vasopneumatic      Electrical Stimulation   Electrical Stimulation Location  L shoulder IFC 1-10hz  x 15 mins    Electrical Stimulation Goals  Pain      Vasopneumatic   Number Minutes Vasopneumatic   15 minutes    Vasopnuematic Location   Shoulder    Vasopneumatic Pressure  Medium    Vasopneumatic Temperature   34               PT Short Term Goals - 11/06/17 1508      PT SHORT TERM GOAL #1   Title  STG's=LTG's.        PT Long Term Goals - 12/27/17 1761      PT LONG TERM GOAL #1   Title  Independent with a HEP.    Time  4    Period  Weeks    Status  Achieved      PT LONG TERM  GOAL #2   Title  Active left shoulder flexion to 145 degrees so the patient can easily reach overhead.    Time  4    Period  Weeks    Status  On-going      PT LONG TERM GOAL #3   Title  Active ER to 70 degrees+ to allow for easily donning/doffing of apparel.    Time  4    Period  Weeks    Status  On-going      PT LONG TERM GOAL #4   Title  Increase ROM so patient is able to reach behind back.    Time  4    Period  Weeks    Status  On-going      PT LONG TERM GOAL #5   Title  Increase left shoulder strength to a solid 4+/5 to increase stability for performance of functional activities.    Time  4    Period  Weeks    Status  On-going      PT LONG TERM GOAL #6   Title  Perform ADL's with pain not > 3/10.    Status  Achieved            Plan - 01/08/18 0859    Clinical Impression Statement  Pt arrived today doing fairly well with mainly soreness. Pt was able to perform all AROM and light strengthening exs with mainly fatigue reported in LT UE. PROM was performed to help increase elevation ROM. PROM  for flexion was  150 degrees, ER was 72 degrees. Normal modality response.    Clinical Presentation  Stable    Rehab Potential  Excellent    PT Frequency  3x / week    PT Duration  4 weeks    PT Treatment/Interventions  ADLs/Self Care Home Management;Cryotherapy;Electrical Stimulation;Ultrasound;Moist Heat;Therapeutic activities;Therapeutic exercise;Patient/family education;Neuromuscular re-education;Manual techniques;Passive range of motion;Vasopneumatic Device    PT Next Visit Plan  Continue light strengthening per MPT POC.    PT Home Exercise Plan  HEP- RW4 with yellow theraband    Consulted and Agree with Plan of Care  Patient       Patient will benefit from skilled therapeutic intervention in order to improve the following deficits and impairments:  Decreased activity tolerance, Decreased range of motion, Pain  Visit Diagnosis: Chronic left shoulder pain  Stiffness of  left shoulder, not elsewhere classified     Problem List Patient Active Problem List   Diagnosis Date Noted  . Chronic night sweats 07/31/2016  . DDD (degenerative disc disease), cervical 07/29/2015  . Prediabetes 06/30/2013  . Erectile dysfunction 06/30/2013  . Routine general medical examination at a health care facility 04/12/2011  . ESOPHAGEAL STRICTURE 12/14/2008  . Gastritis and gastroduodenitis 12/14/2008  . BPH associated with nocturia 12/03/2008  . Osteoarthrosis, hand 12/03/2008  . Hyperlipidemia with target LDL less than 130 09/28/2008    Brittny Spangle,CHRIS, PTA 01/08/2018, 12:58 PM  [facility] [place], [place], [address] Phone: [phone]   Fax:  [phone]  Name: Don Johnson MRN: [id number] Date of Birth: Feb 25, 1941

## 2018-01-10 ENCOUNTER — Ambulatory Visit: Payer: Medicare Other | Attending: Orthopedic Surgery | Admitting: *Deleted

## 2018-01-10 DIAGNOSIS — M25612 Stiffness of left shoulder, not elsewhere classified: Secondary | ICD-10-CM | POA: Diagnosis not present

## 2018-01-10 DIAGNOSIS — M25512 Pain in left shoulder: Secondary | ICD-10-CM | POA: Insufficient documentation

## 2018-01-10 DIAGNOSIS — G8929 Other chronic pain: Secondary | ICD-10-CM | POA: Diagnosis not present

## 2018-01-10 NOTE — Therapy (Signed)
[facility] [place], [place], [address] Phone: [phone]   Fax:  [phone]  Physical Therapy Treatment  Patient Details  Name: Don Johnson MRN: [id number] Date of Birth: 1941/02/10 Referring Provider: Bard Herbert MD   Encounter Date: 01/10/2018  PT End of Session - 01/10/18 0841    Visit Number  20    Number of Visits  28    Date for PT Re-Evaluation  01/28/18    Authorization Type  FOTO AT LEAST EVERY 5TH VISIT.    Authorization Time Period  MD 01-30-18    PT Start Time  0815    PT Stop Time  0914    PT Time Calculation (min)  59 min       Past Medical History:  Diagnosis Date  . Bilateral recurrent inguinal hernia   . BPH with obstruction/lower urinary tract symptoms   . Cough 09/20/2016  . DDD (degenerative disc disease), cervical   . Diverticulosis of colon   . GERD (gastroesophageal reflux disease)   . Hiatal hernia   . History of adenomatous polyp of colon    2006 tubular adenoma;  2010 tubular adenoma and hyperplastic  . Hyperlipidemia   . OA (osteoarthritis)    hands  . Organic sexual dysfunction   . Pre-diabetes   . S/P dilatation of esophageal stricture    multiple times --  last time w/ balloon 12-31-2015  . Wears dentures    upper denture, lower partial  . Wears glasses     Past Surgical History:  Procedure Laterality Date  . COLONOSCOPY  last one 03-14-2011  . ESOPHAGOGASTRODUODENOSCOPY (EGD) WITH ESOPHAGEAL DILATION  last one 12-31-2015  . HEMORROIDECTOMY  1970's  . INGUINAL HERNIA REPAIR Bilateral 1980's  . PENILE PROSTHESIS IMPLANT N/A 09/29/2016   Procedure: PENILE PROTHESIS INFLATABLE;  Surgeon: Carolan Clines, MD;  Location: [facility];  Service: Urology;  Laterality: N/A;  . UMBILICAL HERNIA REPAIR  2007 approx    There were no vitals filed for this visit.  Subjective Assessment - 01/10/18 0830    Subjective  2/10 LT shldr. Just sore    Pertinent  History  Cervical disc disease; OA.    Patient Stated Goals  Use left arm again.    Currently in Pain?  Yes    Pain Score  2     Pain Location  Shoulder    Pain Orientation  Left    Pain Descriptors / Indicators  Discomfort    Pain Type  Surgical pain    Pain Onset  More than a month ago    Pain Frequency  Intermittent                       [facility] Adult PT Treatment/Exercise - 01/10/18 0001      Shoulder Exercises: Supine   Protraction  Strengthening;Left;20 reps;Weights;10 reps    Protraction Weight (lbs)  2      Shoulder Exercises: Prone   Retraction  Strengthening;Left;20 reps;Weights;10 reps    Retraction Weight (lbs)  2    Extension  Strengthening;Left;20 reps;Weights;10 reps    Extension Weight (lbs)  2    Horizontal ABduction 1  Strengthening;Left;20 reps;Weights;10 reps    Horizontal ABduction 1 Weight (lbs)  1      Shoulder Exercises: Sidelying   External Rotation  Strengthening;Left;20 reps;Weights    External Rotation Weight (lbs)  2    External Rotation Limitations  3x10 reps  Flexion  AROM;Left;20 reps;10 reps      Shoulder Exercises: Standing   External Rotation  Strengthening;Left;20 reps;Theraband    Theraband Level (Shoulder External Rotation)  Level 1 (Yellow)    Flexion  AROM;20 reps;10 reps 3x10      Shoulder Exercises: Pulleys   Flexion  Other (comment) x7 min      Shoulder Exercises: ROM/Strengthening   UBE (Upper Arm Bike)  90 RPM x6 min (3 m forward, 3 m backward)      Modalities   Modalities  Electrical Stimulation;Vasopneumatic      Electrical Stimulation   Electrical Stimulation Location  L shoulder IFC 1-10hz  x 15 mins    Electrical Stimulation Goals  Pain      Vasopneumatic   Number Minutes Vasopneumatic   15 minutes    Vasopnuematic Location   Shoulder    Vasopneumatic Pressure  Medium    Vasopneumatic Temperature   34      Manual Therapy   Manual Therapy  Passive ROM    Passive ROM  PROM of L shoulder into  flexion, ER/IR with gentle holds at end range with rhythmic stabs in ER and flexion               PT Short Term Goals - 11/06/17 1508      PT SHORT TERM GOAL #1   Title  STG's=LTG's.        PT Long Term Goals - 12/27/17 1517      PT LONG TERM GOAL #1   Title  Independent with a HEP.    Time  4    Period  Weeks    Status  Achieved      PT LONG TERM GOAL #2   Title  Active left shoulder flexion to 145 degrees so the patient can easily reach overhead.    Time  4    Period  Weeks    Status  On-going      PT LONG TERM GOAL #3   Title  Active ER to 70 degrees+ to allow for easily donning/doffing of apparel.    Time  4    Period  Weeks    Status  On-going      PT LONG TERM GOAL #4   Title  Increase ROM so patient is able to reach behind back.    Time  4    Period  Weeks    Status  On-going      PT LONG TERM GOAL #5   Title  Increase left shoulder strength to a solid 4+/5 to increase stability for performance of functional activities.    Time  4    Period  Weeks    Status  On-going      PT LONG TERM GOAL #6   Title  Perform ADL's with pain not > 3/10.    Status  Achieved            Plan - 01/10/18 6160    Clinical Impression Statement  Pt arrived today with mainly soreness LT shldr. He was able to complete all AROM and light strengthening therex with mainly fatigue. PROM flexion to 155 degrees today.       Patient will benefit from skilled therapeutic intervention in order to improve the following deficits and impairments:     Visit Diagnosis: Chronic left shoulder pain  Stiffness of left shoulder, not elsewhere classified     Problem List Patient Active Problem List   Diagnosis Date Noted  .  Chronic night sweats 07/31/2016  . DDD (degenerative disc disease), cervical 07/29/2015  . Prediabetes 06/30/2013  . Erectile dysfunction 06/30/2013  . Routine general medical examination at a health care facility 04/12/2011  . ESOPHAGEAL STRICTURE  12/14/2008  . Gastritis and gastroduodenitis 12/14/2008  . BPH associated with nocturia 12/03/2008  . Osteoarthrosis, hand 12/03/2008  . Hyperlipidemia with target LDL less than 130 09/28/2008    Erol Flanagin,CHRIS, PTA 01/10/2018, 9:07 AM  [facility] [place], [place], [address] Phone: [phone]   Fax:  [phone]  Name: VEDDER BRITTIAN MRN: [id number] Date of Birth: 07-25-1941

## 2018-01-15 ENCOUNTER — Ambulatory Visit: Payer: Medicare Other | Admitting: Physical Therapy

## 2018-01-15 ENCOUNTER — Encounter: Payer: Self-pay | Admitting: Physical Therapy

## 2018-01-15 DIAGNOSIS — G8929 Other chronic pain: Secondary | ICD-10-CM | POA: Diagnosis not present

## 2018-01-15 DIAGNOSIS — M25612 Stiffness of left shoulder, not elsewhere classified: Secondary | ICD-10-CM | POA: Diagnosis not present

## 2018-01-15 DIAGNOSIS — M25512 Pain in left shoulder: Principal | ICD-10-CM

## 2018-01-15 NOTE — Therapy (Signed)
[facility] [place], [place], [address] Phone: [phone]   Fax:  [phone]  Physical Therapy Treatment  Patient Details  Name: Don Johnson MRN: [id number] Date of Birth: 01-05-1941 Referring Provider: Bard Herbert MD   Encounter Date: 01/15/2018  PT End of Session - 01/15/18 0820    Visit Number  21    Number of Visits  28    Date for PT Re-Evaluation  01/28/18    Authorization Type  FOTO AT LEAST EVERY 5TH VISIT.    Authorization Time Period  MD 01-30-18    PT Start Time  [phone]    PT Stop Time  0901    PT Time Calculation (min)  45 min    Activity Tolerance  Patient tolerated treatment well    Behavior During Therapy  [facility] for tasks assessed/performed       Past Medical History:  Diagnosis Date  . Bilateral recurrent inguinal hernia   . BPH with obstruction/lower urinary tract symptoms   . Cough 09/20/2016  . DDD (degenerative disc disease), cervical   . Diverticulosis of colon   . GERD (gastroesophageal reflux disease)   . Hiatal hernia   . History of adenomatous polyp of colon    2006 tubular adenoma;  2010 tubular adenoma and hyperplastic  . Hyperlipidemia   . OA (osteoarthritis)    hands  . Organic sexual dysfunction   . Pre-diabetes   . S/P dilatation of esophageal stricture    multiple times --  last time w/ balloon 12-31-2015  . Wears dentures    upper denture, lower partial  . Wears glasses     Past Surgical History:  Procedure Laterality Date  . COLONOSCOPY  last one 03-14-2011  . ESOPHAGOGASTRODUODENOSCOPY (EGD) WITH ESOPHAGEAL DILATION  last one 12-31-2015  . HEMORROIDECTOMY  1970's  . INGUINAL HERNIA REPAIR Bilateral 1980's  . PENILE PROSTHESIS IMPLANT N/A 09/29/2016   Procedure: PENILE PROTHESIS INFLATABLE;  Surgeon: Carolan Clines, MD;  Location: [facility];  Service: Urology;  Laterality: N/A;  . UMBILICAL HERNIA REPAIR  2007 approx    There were no vitals  filed for this visit.  Subjective Assessment - 01/15/18 0820    Subjective  Reports a little posterior L shoulder pain upon arrival.    Pertinent History  Cervical disc disease; OA.    Patient Stated Goals  Use left arm again.    Currently in Pain?  Yes    Pain Score  1     Pain Location  Shoulder    Pain Orientation  Left;Posterior    Pain Descriptors / Indicators  Discomfort    Pain Type  Surgical pain    Pain Onset  More than a month ago         [facility] PT Assessment - 01/15/18 0001      Assessment   Medical Diagnosis  left shoulder full thickness tear.    Onset Date/Surgical Date  09/25/17    Next MD Visit  01/30/2018      Restrictions   Weight Bearing Restrictions  No                   [facility] Adult PT Treatment/Exercise - 01/15/18 0001      Shoulder Exercises: Prone   Retraction  Strengthening;Left;Weights;Limitations    Retraction Weight (lbs)  2    Retraction Limitations  3x10 reps    Extension  Strengthening;Left;Weights;Limitations    Extension Weight (lbs)  2  Extension Limitations  3x10 reps    Horizontal ABduction 1  Strengthening;Left;Weights;Limitations    Horizontal ABduction 1 Weight (lbs)  2    Horizontal ABduction 1 Limitations  3x10 reps      Shoulder Exercises: Sidelying   External Rotation  Strengthening;Left;20 reps;Weights    External Rotation Weight (lbs)  2      Shoulder Exercises: Standing   Protraction  Strengthening;Left;20 reps;Theraband    Theraband Level (Shoulder Protraction)  Level 2 (Red)    External Rotation  Strengthening;Left;20 reps;Theraband    Theraband Level (Shoulder External Rotation)  Level 2 (Red)    Internal Rotation  Strengthening;Left;20 reps;Theraband    Theraband Level (Shoulder Internal Rotation)  Level 2 (Red)    Flexion  Strengthening;Left;20 reps;Weights    Shoulder Flexion Weight (lbs)  1    Extension  Strengthening;Left;20 reps;Theraband    Theraband Level (Shoulder Extension)  Level 2 (Red)     Row  Strengthening;Left;20 reps;Theraband    Theraband Level (Shoulder Row)  Level 2 (Red)    Other Standing Exercises  Ball taps into flexion 2# x20 reps    Other Standing Exercises  L shoulder scaption 1# x15 reps      Shoulder Exercises: ROM/Strengthening   UBE (Upper Arm Bike)  60 RPM x8 min forward, backward    Wall Wash  2# CW and CCW x20 reps      Modalities   Modalities  Psychologist, educational Location  L shoulder    Electrical Stimulation Action  IFC    Electrical Stimulation Parameters  1-10 hz x15 min    Electrical Stimulation Goals  Pain      Vasopneumatic   Number Minutes Vasopneumatic   15 minutes    Vasopnuematic Location   Shoulder    Vasopneumatic Pressure  Low    Vasopneumatic Temperature   34               PT Short Term Goals - 11/06/17 1508      PT SHORT TERM GOAL #1   Title  STG's=LTG's.        PT Long Term Goals - 12/27/17 5366      PT LONG TERM GOAL #1   Title  Independent with a HEP.    Time  4    Period  Weeks    Status  Achieved      PT LONG TERM GOAL #2   Title  Active left shoulder flexion to 145 degrees so the patient can easily reach overhead.    Time  4    Period  Weeks    Status  On-going      PT LONG TERM GOAL #3   Title  Active ER to 70 degrees+ to allow for easily donning/doffing of apparel.    Time  4    Period  Weeks    Status  On-going      PT LONG TERM GOAL #4   Title  Increase ROM so patient is able to reach behind back.    Time  4    Period  Weeks    Status  On-going      PT LONG TERM GOAL #5   Title  Increase left shoulder strength to a solid 4+/5 to increase stability for performance of functional activities.    Time  4    Period  Weeks    Status  On-going      PT LONG  TERM GOAL #6   Title  Perform ADL's with pain not > 3/10.    Status  Achieved            Plan - 01/15/18 4081    Clinical Impression Statement  Patient  continues to tolerate strengthening PT well with good form and technique following initial VCs and demo. Fatigued quickly with standing flexion and scaption with 1# handweight. Increased resistance utilized throughout treatment today with no complaints by patient. Normal modalities response noted following removal of the modalities. ROM goals and stength goals on-going at this time.    Rehab Potential  Excellent    PT Frequency  3x / week    PT Duration  4 weeks    PT Treatment/Interventions  ADLs/Self Care Home Management;Cryotherapy;Electrical Stimulation;Ultrasound;Moist Heat;Therapeutic activities;Therapeutic exercise;Patient/family education;Neuromuscular re-education;Manual techniques;Passive range of motion;Vasopneumatic Device    PT Next Visit Plan  Continue light strengthening per MPT POC.    PT Home Exercise Plan  HEP- RW4 with yellow theraband    Consulted and Agree with Plan of Care  Patient       Patient will benefit from skilled therapeutic intervention in order to improve the following deficits and impairments:  Decreased activity tolerance, Decreased range of motion, Pain, Hypomobility  Visit Diagnosis: Chronic left shoulder pain  Stiffness of left shoulder, not elsewhere classified     Problem List Patient Active Problem List   Diagnosis Date Noted  . Chronic night sweats 07/31/2016  . DDD (degenerative disc disease), cervical 07/29/2015  . Prediabetes 06/30/2013  . Erectile dysfunction 06/30/2013  . Routine general medical examination at a health care facility 04/12/2011  . ESOPHAGEAL STRICTURE 12/14/2008  . Gastritis and gastroduodenitis 12/14/2008  . BPH associated with nocturia 12/03/2008  . Osteoarthrosis, hand 12/03/2008  . Hyperlipidemia with target LDL less than 130 09/28/2008    Standley Brooking, PTA 01/15/2018, 9:03 AM  [facility] [address] [place], [place], [address] Phone: [phone]   Fax:   [phone]  Name: Don Johnson MRN: [id number] Date of Birth: 1941/04/10

## 2018-01-17 ENCOUNTER — Encounter: Payer: Self-pay | Admitting: Physical Therapy

## 2018-01-17 ENCOUNTER — Ambulatory Visit: Payer: Medicare Other | Admitting: Physical Therapy

## 2018-01-17 DIAGNOSIS — M25612 Stiffness of left shoulder, not elsewhere classified: Secondary | ICD-10-CM

## 2018-01-17 DIAGNOSIS — M25512 Pain in left shoulder: Secondary | ICD-10-CM | POA: Diagnosis not present

## 2018-01-17 DIAGNOSIS — G8929 Other chronic pain: Secondary | ICD-10-CM

## 2018-01-17 NOTE — Therapy (Signed)
[facility] [place], [place], [address] Phone: [phone]   Fax:  [phone]  Physical Therapy Treatment  Patient Details  Name: Don Johnson MRN: [id number] Date of Birth: 1941/08/30 Referring Provider: Bard Herbert MD   Encounter Date: 01/17/2018  PT End of Session - 01/17/18 0821    Visit Number  22    Number of Visits  28    Date for PT Re-Evaluation  01/28/18    Authorization Type  FOTO AT LEAST EVERY 5TH VISIT.    Authorization Time Period  MD 01-30-18    PT Start Time  0819    PT Stop Time  0901    PT Time Calculation (min)  42 min    Activity Tolerance  Patient tolerated treatment well    Behavior During Therapy  [facility] for tasks assessed/performed       Past Medical History:  Diagnosis Date  . Bilateral recurrent inguinal hernia   . BPH with obstruction/lower urinary tract symptoms   . Cough 09/20/2016  . DDD (degenerative disc disease), cervical   . Diverticulosis of colon   . GERD (gastroesophageal reflux disease)   . Hiatal hernia   . History of adenomatous polyp of colon    2006 tubular adenoma;  2010 tubular adenoma and hyperplastic  . Hyperlipidemia   . OA (osteoarthritis)    hands  . Organic sexual dysfunction   . Pre-diabetes   . S/P dilatation of esophageal stricture    multiple times --  last time w/ balloon 12-31-2015  . Wears dentures    upper denture, lower partial  . Wears glasses     Past Surgical History:  Procedure Laterality Date  . COLONOSCOPY  last one 03-14-2011  . ESOPHAGOGASTRODUODENOSCOPY (EGD) WITH ESOPHAGEAL DILATION  last one 12-31-2015  . HEMORROIDECTOMY  1970's  . INGUINAL HERNIA REPAIR Bilateral 1980's  . PENILE PROSTHESIS IMPLANT N/A 09/29/2016   Procedure: PENILE PROTHESIS INFLATABLE;  Surgeon: Carolan Clines, MD;  Location: [facility];  Service: Urology;  Laterality: N/A;  . UMBILICAL HERNIA REPAIR  2007 approx    There were no vitals  filed for this visit.  Subjective Assessment - 01/17/18 0820    Subjective  Reports a little superior L shoulder soreness.    Pertinent History  Cervical disc disease; OA.    Patient Stated Goals  Use left arm again.    Currently in Pain?  Yes    Pain Score  1     Pain Location  Shoulder    Pain Orientation  Left;Proximal    Pain Descriptors / Indicators  Sore    Pain Type  Surgical pain    Pain Onset  More than a month ago         [facility] PT Assessment - 01/17/18 0001      Assessment   Medical Diagnosis  left shoulder full thickness tear.    Onset Date/Surgical Date  09/25/17    Next MD Visit  01/30/2018      Restrictions   Weight Bearing Restrictions  No                   [facility] Adult PT Treatment/Exercise - 01/17/18 0001      Shoulder Exercises: Prone   Horizontal ABduction 1  Strengthening;Left;20 reps;Weights    Horizontal ABduction 1 Weight (lbs)  2      Shoulder Exercises: Sidelying   External Rotation  Strengthening;Left;20 reps;Weights    External Rotation Weight (  lbs)  2      Shoulder Exercises: Standing   Protraction  Strengthening;Left;20 reps;Theraband    Theraband Level (Shoulder Protraction)  Level 2 (Red)    External Rotation  Strengthening;Left;20 reps;Theraband    Theraband Level (Shoulder External Rotation)  Level 2 (Red)    Internal Rotation  Strengthening;Left;20 reps;Theraband    Theraband Level (Shoulder Internal Rotation)  Level 2 (Red)    Flexion  Strengthening;Left;20 reps;Weights    Shoulder Flexion Weight (lbs)  1    Extension  Strengthening;Left;20 reps;Theraband    Theraband Level (Shoulder Extension)  Level 2 (Red)    Row  Strengthening;Left;20 reps;Theraband    Theraband Level (Shoulder Row)  Level 2 (Red)    Other Standing Exercises  Ball taps into flexion 2# x20 reps; L shoulder D1 x20 reps and D2 x15 reps 2#     Other Standing Exercises  L shoulder scaption 1# x20 reps      Shoulder Exercises: ROM/Strengthening   UBE  (Upper Arm Bike)  60 RPM x8 min      Modalities   Modalities  Electrical Stimulation;Vasopneumatic      Electrical Stimulation   Electrical Stimulation Location  L shoulder    Electrical Stimulation Action  Pre-Mod    Electrical Stimulation Parameters  80-150 hz x15 min    Electrical Stimulation Goals  Pain      Vasopneumatic   Number Minutes Vasopneumatic   15 minutes    Vasopnuematic Location   Shoulder    Vasopneumatic Pressure  Low    Vasopneumatic Temperature   34               PT Short Term Goals - 11/06/17 1508      PT SHORT TERM GOAL #1   Title  STG's=LTG's.        PT Long Term Goals - 12/27/17 6967      PT LONG TERM GOAL #1   Title  Independent with a HEP.    Time  4    Period  Weeks    Status  Achieved      PT LONG TERM GOAL #2   Title  Active left shoulder flexion to 145 degrees so the patient can easily reach overhead.    Time  4    Period  Weeks    Status  On-going      PT LONG TERM GOAL #3   Title  Active ER to 70 degrees+ to allow for easily donning/doffing of apparel.    Time  4    Period  Weeks    Status  On-going      PT LONG TERM GOAL #4   Title  Increase ROM so patient is able to reach behind back.    Time  4    Period  Weeks    Status  On-going      PT LONG TERM GOAL #5   Title  Increase left shoulder strength to a solid 4+/5 to increase stability for performance of functional activities.    Time  4    Period  Weeks    Status  On-going      PT LONG TERM GOAL #6   Title  Perform ADL's with pain not > 3/10.    Status  Achieved            Plan - 01/17/18 0856    Clinical Impression Statement  Patient tolerated today's treatment well with only minimal L superior shoulder discomfort along L AC  joint region. Patient's greatest complaint during treatment today being of L shoulder fatigue especially standing wall taps and D1 and D2 strengthening with 2# ball. Only minimal compensatory strategies noted with L shoulder ER in SL  which was corrected with VCs and demo. Normal modalities response noted following removal of the modalities.    Rehab Potential  Excellent    PT Frequency  3x / week    PT Duration  4 weeks    PT Treatment/Interventions  ADLs/Self Care Home Management;Cryotherapy;Electrical Stimulation;Ultrasound;Moist Heat;Therapeutic activities;Therapeutic exercise;Patient/family education;Neuromuscular re-education;Manual techniques;Passive range of motion;Vasopneumatic Device    PT Next Visit Plan  Continue light strengthening per MPT POC.    PT Home Exercise Plan  HEP- RW4 with yellow theraband    Consulted and Agree with Plan of Care  Patient       Patient will benefit from skilled therapeutic intervention in order to improve the following deficits and impairments:  Decreased activity tolerance, Decreased range of motion, Pain, Hypomobility  Visit Diagnosis: Chronic left shoulder pain  Stiffness of left shoulder, not elsewhere classified     Problem List Patient Active Problem List   Diagnosis Date Noted  . Chronic night sweats 07/31/2016  . DDD (degenerative disc disease), cervical 07/29/2015  . Prediabetes 06/30/2013  . Erectile dysfunction 06/30/2013  . Routine general medical examination at a health care facility 04/12/2011  . ESOPHAGEAL STRICTURE 12/14/2008  . Gastritis and gastroduodenitis 12/14/2008  . BPH associated with nocturia 12/03/2008  . Osteoarthrosis, hand 12/03/2008  . Hyperlipidemia with target LDL less than 130 09/28/2008    Standley Brooking, PTA 01/17/2018, 9:06 AM  [facility] [address] [place], [place], [address] Phone: [phone]   Fax:  [phone]  Name: Don Johnson MRN: [id number] Date of Birth: July 11, 1941

## 2018-01-21 ENCOUNTER — Ambulatory Visit (INDEPENDENT_AMBULATORY_CARE_PROVIDER_SITE_OTHER): Payer: Medicare Other | Admitting: Family

## 2018-01-21 ENCOUNTER — Encounter: Payer: Self-pay | Admitting: Family

## 2018-01-21 VITALS — BP 150/78 | HR 64 | Temp 97.8°F | Ht 66.5 in | Wt 149.0 lb

## 2018-01-21 DIAGNOSIS — J019 Acute sinusitis, unspecified: Secondary | ICD-10-CM

## 2018-01-21 DIAGNOSIS — R05 Cough: Secondary | ICD-10-CM

## 2018-01-21 DIAGNOSIS — R059 Cough, unspecified: Secondary | ICD-10-CM

## 2018-01-21 MED ORDER — FLUTICASONE PROPIONATE 50 MCG/ACT NA SUSP: 2.0000 | Freq: Every day | NASAL | 6 refills | Status: DC

## 2018-01-21 MED ORDER — BENZONATATE 100 MG PO CAPS: 100.0000 mg | ORAL_CAPSULE | Freq: Three times a day (TID) | ORAL | 0 refills | Status: DC | PRN

## 2018-01-21 MED ORDER — AMOXICILLIN-POT CLAVULANATE 875-125 MG PO TABS: 1.0000 | ORAL_TABLET | Freq: Two times a day (BID) | ORAL | 0 refills | Status: DC

## 2018-01-21 NOTE — Progress Notes (Signed)
Don Johnson is a 77 y.o. male with the following history as recorded in EpicCare:  Patient Active Problem List   Diagnosis Date Noted  . Chronic night sweats 07/31/2016  . DDD (degenerative disc disease), cervical 07/29/2015  . Prediabetes 06/30/2013  . Erectile dysfunction 06/30/2013  . Routine general medical examination at a health care facility 04/12/2011  . ESOPHAGEAL STRICTURE 12/14/2008  . Gastritis and gastroduodenitis 12/14/2008  . BPH associated with nocturia 12/03/2008  . Osteoarthrosis, hand 12/03/2008  . Hyperlipidemia with target LDL less than 130 09/28/2008    Current Outpatient Medications  Medication Sig Dispense Refill  . amoxicillin-clavulanate (AUGMENTIN) 875-125 MG tablet Take 1 tablet by mouth 2 (two) times daily. 20 tablet 0  . aspirin EC 81 MG tablet Take 81 mg by mouth. Take 2 tablets daily    . atorvastatin (LIPITOR) 40 MG tablet Take 1 tablet (40 mg total) by mouth daily. 90 tablet 0  . benzonatate (TESSALON) 100 MG capsule Take 1 capsule (100 mg total) by mouth 3 (three) times daily as needed. 30 capsule 0  . fluticasone (FLONASE) 50 MCG/ACT nasal spray Place 2 sprays into both nostrils daily. 16 g 6  . Multiple Vitamins-Minerals (CENTRUM SILVER PO) Take by mouth daily.    Marland Kitchen omeprazole (PRILOSEC) 40 MG capsule TAKE 1 CAPSULE EVERY DAY 30 capsule 3   No current facility-administered medications for this visit.     Allergies: Patient has no allergy information on record.  Past Medical History:  Diagnosis Date  . Bilateral recurrent inguinal hernia   . BPH with obstruction/lower urinary tract symptoms   . Cough 09/20/2016  . DDD (degenerative disc disease), cervical   . Diverticulosis of colon   . GERD (gastroesophageal reflux disease)   . Hiatal hernia   . History of adenomatous polyp of colon    2006 tubular adenoma;  2010 tubular adenoma and hyperplastic  . Hyperlipidemia   . OA (osteoarthritis)    hands  . Organic sexual dysfunction   .  Pre-diabetes   . S/P dilatation of esophageal stricture    multiple times --  last time w/ balloon 12-31-2015  . Wears dentures    upper denture, lower partial  . Wears glasses     Past Surgical History:  Procedure Laterality Date  . COLONOSCOPY  last one 03-14-2011  . ESOPHAGOGASTRODUODENOSCOPY (EGD) WITH ESOPHAGEAL DILATION  last one 12-31-2015  . HEMORROIDECTOMY  1970's  . INGUINAL HERNIA REPAIR Bilateral 1980's  . PENILE PROSTHESIS IMPLANT N/A 09/29/2016   Procedure: PENILE PROTHESIS INFLATABLE;  Surgeon: Carolan Clines, MD;  Location: [facility];  Service: Urology;  Laterality: N/A;  . UMBILICAL HERNIA REPAIR  2007 approx    Family History  Problem Relation Age of Onset  . Diabetes Mother   . Colon polyps Mother   . Arthritis Mother   . Hypertension Mother   . Hyperlipidemia Mother   . Diabetes Sister   . Leukemia Brother   . COPD Brother     Social History   Tobacco Use  . Smoking status: Never Smoker  . Smokeless tobacco: Never Used  Substance Use Topics  . Alcohol use: No    Alcohol/week: 0.0 oz    Subjective:  Patient presents with concerns for cough/ congestion x 1 week- worse since Saturday evening; denies any chest pain, shortness of breath; wife is sick with similar symptoms; denies any fever; cough is problematic at night.   Objective:  Vitals:   01/21/18 1332  BP: Marland Kitchen)  150/78  Pulse: 64  Temp: 97.8 F (36.6 C)  TempSrc: Oral  SpO2: 96%  Weight: 149 lb (67.6 kg)  Height: 5' 6.5" (1.689 m)    General: Well developed, well nourished, in no acute distress  Skin : Warm and dry.  Head: Normocephalic and atraumatic  Eyes: Sclera and conjunctiva clear; pupils round and reactive to light; extraocular movements intact  Ears: External normal; canals clear; tympanic membranes normal  Oropharynx: Pink, supple. No suspicious lesions  Neck: Supple without thyromegaly, adenopathy  Lungs: Respirations unlabored; clear to auscultation  bilaterally without wheeze, rales, rhonchi  Vessels: Symmetric bilaterally  Neurologic: Alert and oriented; speech intact; face symmetrical; moves all extremities well; CNII-XII intact without focal deficit   Assessment:  1. Cough   2. Acute sinusitis, recurrence not specified, unspecified location     Plan:  Rx for Augmentin 875 mg bid x 10 days, Flonase and Tessalon Perles; increase fluids, rest and follow-up worse, no better.   No follow-ups on file.  Orders Placed This Encounter  Procedures  . DG Chest 2 View    Standing Status:   Future    Standing Expiration Date:   03/24/2019    Order Specific Question:   Reason for Exam (SYMPTOM  OR DIAGNOSIS REQUIRED)    Answer:   cough    Order Specific Question:   Preferred imaging location?    Answer:   [facility]    Order Specific Question:   Radiology Contrast Protocol - do NOT remove file path    Answer:   [email or web address]    Requested Prescriptions   Signed Prescriptions Disp Refills  . amoxicillin-clavulanate (AUGMENTIN) 875-125 MG tablet 20 tablet 0    Sig: Take 1 tablet by mouth 2 (two) times daily.  . fluticasone (FLONASE) 50 MCG/ACT nasal spray 16 g 6    Sig: Place 2 sprays into both nostrils daily.  . benzonatate (TESSALON) 100 MG capsule 30 capsule 0    Sig: Take 1 capsule (100 mg total) by mouth 3 (three) times daily as needed.

## 2018-01-22 ENCOUNTER — Encounter: Payer: Self-pay | Admitting: Physical Therapy

## 2018-01-22 ENCOUNTER — Ambulatory Visit: Payer: Medicare Other | Admitting: Physical Therapy

## 2018-01-22 DIAGNOSIS — M25512 Pain in left shoulder: Principal | ICD-10-CM

## 2018-01-22 DIAGNOSIS — M25612 Stiffness of left shoulder, not elsewhere classified: Secondary | ICD-10-CM

## 2018-01-22 DIAGNOSIS — G8929 Other chronic pain: Secondary | ICD-10-CM

## 2018-01-22 NOTE — Therapy (Signed)
[facility] [place], [place], [address] Phone: [phone]   Fax:  [phone]  Physical Therapy Treatment  Patient Details  Name: Don Johnson MRN: [id number] Date of Birth: Jul 02, 1941 Referring Provider: Bard Herbert MD   Encounter Date: 01/22/2018  PT End of Session - 01/22/18 0814    Visit Number  23    Number of Visits  28    Date for PT Re-Evaluation  01/28/18    Authorization Type  FOTO AT LEAST EVERY 5TH VISIT.    PT Start Time  [phone]    PT Stop Time  0907    PT Time Calculation (min)  51 min    Activity Tolerance  Patient tolerated treatment well    Behavior During Therapy  [facility] for tasks assessed/performed       Past Medical History:  Diagnosis Date  . Bilateral recurrent inguinal hernia   . BPH with obstruction/lower urinary tract symptoms   . Cough 09/20/2016  . DDD (degenerative disc disease), cervical   . Diverticulosis of colon   . GERD (gastroesophageal reflux disease)   . Hiatal hernia   . History of adenomatous polyp of colon    2006 tubular adenoma;  2010 tubular adenoma and hyperplastic  . Hyperlipidemia   . OA (osteoarthritis)    hands  . Organic sexual dysfunction   . Pre-diabetes   . S/P dilatation of esophageal stricture    multiple times --  last time w/ balloon 12-31-2015  . Wears dentures    upper denture, lower partial  . Wears glasses     Past Surgical History:  Procedure Laterality Date  . COLONOSCOPY  last one 03-14-2011  . ESOPHAGOGASTRODUODENOSCOPY (EGD) WITH ESOPHAGEAL DILATION  last one 12-31-2015  . HEMORROIDECTOMY  1970's  . INGUINAL HERNIA REPAIR Bilateral 1980's  . PENILE PROSTHESIS IMPLANT N/A 09/29/2016   Procedure: PENILE PROTHESIS INFLATABLE;  Surgeon: Carolan Clines, MD;  Location: [facility];  Service: Urology;  Laterality: N/A;  . UMBILICAL HERNIA REPAIR  2007 approx    There were no vitals filed for this visit.  Subjective  Assessment - 01/22/18 0814    Subjective  Reports a little soreness but thinks that may be where he may have slept on his shoulder.    Pertinent History  Cervical disc disease; OA.    Patient Stated Goals  Use left arm again.    Currently in Pain?  Other (Comment) No full pain assessment provided by patient only reports of soreness         [facility] PT Assessment - 01/22/18 0001      Assessment   Medical Diagnosis  left shoulder full thickness tear.    Onset Date/Surgical Date  09/25/17    Next MD Visit  01/30/2018      Restrictions   Weight Bearing Restrictions  No                   [facility] Adult PT Treatment/Exercise - 01/22/18 0001      Shoulder Exercises: Standing   Protraction  Strengthening;Left;Theraband;Limitations    Theraband Level (Shoulder Protraction)  Level 2 (Red)    Protraction Limitations  3x10 reps    External Rotation  Strengthening;Left;Theraband;Limitations    Theraband Level (Shoulder External Rotation)  Level 2 (Red)    External Rotation Limitations  3x10 reps    Internal Rotation  Strengthening;Left;Theraband;Limitations    Theraband Level (Shoulder Internal Rotation)  Level 2 (Red)    Internal Rotation  Limitations  3x10 reps    Flexion  Strengthening;Left;Weights;Limitations    Shoulder Flexion Weight (lbs)  1    Flexion Limitations  3x10 reps    ABduction  Strengthening;Right;20 reps;Weights    Shoulder ABduction Weight (lbs)  1    Extension  Strengthening;Left;Theraband;Limitations    Theraband Level (Shoulder Extension)  Level 2 (Red)    Extension Limitations  3x10 reps    Row  Strengthening;Left;Theraband;Limitations    Theraband Level (Shoulder Row)  Level 2 (Red)    Row Limitations  3x10 reps    Other Standing Exercises  Ball taps into flexion 2# x20 reps; L shoulder D1 x15 reps and D2 x15 reps 2#     Other Standing Exercises  L shoulder scaption 1# x30 reps      Shoulder Exercises: ROM/Strengthening   UBE (Upper Arm Bike)  60 RPM x8  min    Wall Pushups  20 reps      Modalities   Modalities  Electrical Stimulation;Vasopneumatic      Electrical Stimulation   Electrical Stimulation Location  L shoulder    Electrical Stimulation Action  IFC    Electrical Stimulation Parameters  1-10 hz x15 min    Electrical Stimulation Goals  Pain      Vasopneumatic   Number Minutes Vasopneumatic   15 minutes    Vasopnuematic Location   Shoulder    Vasopneumatic Pressure  Low    Vasopneumatic Temperature   34               PT Short Term Goals - 11/06/17 1508      PT SHORT TERM GOAL #1   Title  STG's=LTG's.        PT Long Term Goals - 12/27/17 7628      PT LONG TERM GOAL #1   Title  Independent with a HEP.    Time  4    Period  Weeks    Status  Achieved      PT LONG TERM GOAL #2   Title  Active left shoulder flexion to 145 degrees so the patient can easily reach overhead.    Time  4    Period  Weeks    Status  On-going      PT LONG TERM GOAL #3   Title  Active ER to 70 degrees+ to allow for easily donning/doffing of apparel.    Time  4    Period  Weeks    Status  On-going      PT LONG TERM GOAL #4   Title  Increase ROM so patient is able to reach behind back.    Time  4    Period  Weeks    Status  On-going      PT LONG TERM GOAL #5   Title  Increase left shoulder strength to a solid 4+/5 to increase stability for performance of functional activities.    Time  4    Period  Weeks    Status  On-going      PT LONG TERM GOAL #6   Title  Perform ADL's with pain not > 3/10.    Status  Achieved            Plan - 01/22/18 0857    Clinical Impression Statement  Patient tolerated today's treatment well as number of reps increased today. Patient continues to fatigue quickly with standing L shoulder PNF and standing abduction thus limited with reps. All other exercises completed with good  form and technique following initial minimal VCs and demo. Normal modalities response noted following removal of  the modalities. Patient provided red theraband for HEP progression at home.    Rehab Potential  Excellent    PT Frequency  3x / week    PT Duration  4 weeks    PT Treatment/Interventions  ADLs/Self Care Home Management;Cryotherapy;Electrical Stimulation;Ultrasound;Moist Heat;Therapeutic activities;Therapeutic exercise;Patient/family education;Neuromuscular re-education;Manual techniques;Passive range of motion;Vasopneumatic Device    PT Next Visit Plan  Continue light strengthening per MPT POC.    PT Home Exercise Plan  HEP- RW4 with yellow theraband    Consulted and Agree with Plan of Care  Patient       Patient will benefit from skilled therapeutic intervention in order to improve the following deficits and impairments:  Decreased activity tolerance, Decreased range of motion, Pain, Hypomobility  Visit Diagnosis: Chronic left shoulder pain  Stiffness of left shoulder, not elsewhere classified     Problem List Patient Active Problem List   Diagnosis Date Noted  . Chronic night sweats 07/31/2016  . DDD (degenerative disc disease), cervical 07/29/2015  . Prediabetes 06/30/2013  . Erectile dysfunction 06/30/2013  . Routine general medical examination at a health care facility 04/12/2011  . ESOPHAGEAL STRICTURE 12/14/2008  . Gastritis and gastroduodenitis 12/14/2008  . BPH associated with nocturia 12/03/2008  . Osteoarthrosis, hand 12/03/2008  . Hyperlipidemia with target LDL less than 130 09/28/2008    Standley Brooking, PTA 01/22/2018, 9:14 AM  [facility] [address] [place], [place], [address] Phone: [phone]   Fax:  [phone]  Name: Don Johnson MRN: [id number] Date of Birth: 10-18-1940

## 2018-01-23 ENCOUNTER — Other Ambulatory Visit: Payer: Self-pay | Admitting: Internal Medicine

## 2018-01-23 DIAGNOSIS — E785 Hyperlipidemia, unspecified: Secondary | ICD-10-CM

## 2018-01-24 ENCOUNTER — Ambulatory Visit: Payer: Medicare Other | Admitting: *Deleted

## 2018-01-29 ENCOUNTER — Ambulatory Visit: Payer: Medicare Other | Admitting: Physical Therapy

## 2018-01-29 ENCOUNTER — Encounter: Payer: Self-pay | Admitting: Physical Therapy

## 2018-01-29 DIAGNOSIS — M25612 Stiffness of left shoulder, not elsewhere classified: Secondary | ICD-10-CM

## 2018-01-29 DIAGNOSIS — M25512 Pain in left shoulder: Secondary | ICD-10-CM | POA: Diagnosis not present

## 2018-01-29 DIAGNOSIS — G8929 Other chronic pain: Secondary | ICD-10-CM

## 2018-01-29 NOTE — Therapy (Addendum)
[facility] [place], [place], [address] Phone: [phone]   Fax:  [phone]  Physical Therapy Treatment  Patient Details  Name: Don Johnson MRN: [id number] Date of Birth: Jan 10, 1941 Referring Provider: Bard Herbert MD   Encounter Date: 01/29/2018  PT End of Session - 01/29/18 0818    Visit Number  24    Number of Visits  28    Date for PT Re-Evaluation  01/28/18    Authorization Type  FOTO AT LEAST EVERY 5TH VISIT.    Authorization Time Period  MD 01-30-18    PT Start Time  0815    PT Stop Time  0903    PT Time Calculation (min)  48 min    Activity Tolerance  Patient tolerated treatment well    Behavior During Therapy  [facility] for tasks assessed/performed       Past Medical History:  Diagnosis Date  . Bilateral recurrent inguinal hernia   . BPH with obstruction/lower urinary tract symptoms   . Cough 09/20/2016  . DDD (degenerative disc disease), cervical   . Diverticulosis of colon   . GERD (gastroesophageal reflux disease)   . Hiatal hernia   . History of adenomatous polyp of colon    2006 tubular adenoma;  2010 tubular adenoma and hyperplastic  . Hyperlipidemia   . OA (osteoarthritis)    hands  . Organic sexual dysfunction   . Pre-diabetes   . S/P dilatation of esophageal stricture    multiple times --  last time w/ balloon 12-31-2015  . Wears dentures    upper denture, lower partial  . Wears glasses     Past Surgical History:  Procedure Laterality Date  . COLONOSCOPY  last one 03-14-2011  . ESOPHAGOGASTRODUODENOSCOPY (EGD) WITH ESOPHAGEAL DILATION  last one 12-31-2015  . HEMORROIDECTOMY  1970's  . INGUINAL HERNIA REPAIR Bilateral 1980's  . PENILE PROSTHESIS IMPLANT N/A 09/29/2016   Procedure: PENILE PROTHESIS INFLATABLE;  Surgeon: Carolan Clines, MD;  Location: [facility];  Service: Urology;  Laterality: N/A;  . UMBILICAL HERNIA REPAIR  2007 approx    There were no vitals  filed for this visit.  Subjective Assessment - 01/29/18 0818    Subjective  No new complaints upon arrival.    Pertinent History  Cervical disc disease; OA.    Patient Stated Goals  Use left arm again.    Currently in Pain?  Yes    Pain Score  1     Pain Location  Shoulder    Pain Orientation  Left    Pain Descriptors / Indicators  Discomfort    Pain Type  Surgical pain    Pain Onset  More than a month ago         [facility] PT Assessment - 01/29/18 0001      Assessment   Medical Diagnosis  left shoulder full thickness tear.    Onset Date/Surgical Date  09/25/17    Next MD Visit  01/30/2018      Restrictions   Weight Bearing Restrictions  No      ROM / Strength   AROM / PROM / Strength  AROM      AROM   Overall AROM   Deficits;Within functional limits for tasks performed    AROM Assessment Site  Shoulder    Right/Left Shoulder  Left    Left Shoulder Flexion  136 Degrees    Left Shoulder External Rotation  78 Degrees  Strength   Overall Strength  Deficits    Strength Assessment Site  Shoulder    Right/Left Shoulder  Left    Left Shoulder Flexion  4+/5    Left Shoulder ABduction  4/5    Left Shoulder Internal Rotation  4+/5    Left Shoulder External Rotation  4/5                   [facility] Adult PT Treatment/Exercise - 01/29/18 0001      Shoulder Exercises: Prone   Retraction  Strengthening;Left;Weights;Limitations    Retraction Weight (lbs)  3    Retraction Limitations  3x10 reps    Extension  Strengthening;Left;Weights;Limitations    Extension Weight (lbs)  3    Extension Limitations  3x10 reps      Shoulder Exercises: Sidelying   External Rotation  Strengthening;Left;Weights;Limitations    External Rotation Weight (lbs)  2    External Rotation Limitations  3x10 reps    Internal Rotation  Strengthening;Right;Weights;Limitations    Internal Rotation Weight (lbs)  2    Internal Rotation Limitations  3x10 reps      Shoulder Exercises: Standing    Protraction  Strengthening;Left;Theraband;Limitations    Theraband Level (Shoulder Protraction)  Level 2 (Red)    Protraction Limitations  3x10 reps    External Rotation  Strengthening;Left;Theraband;Limitations    Theraband Level (Shoulder External Rotation)  Level 2 (Red)    External Rotation Limitations  3x10 reps    Internal Rotation  Strengthening;Left;Theraband;Limitations    Theraband Level (Shoulder Internal Rotation)  Level 2 (Red)    Internal Rotation Limitations  3x10 reps    Flexion  Strengthening;Left;Weights;20 reps    Shoulder Flexion Weight (lbs)  2    Extension  Strengthening;Left;Theraband;Limitations    Theraband Level (Shoulder Extension)  Level 2 (Red)    Extension Limitations  3x10 reps    Row  Strengthening;Left;Theraband;Limitations    Theraband Level (Shoulder Row)  Level 2 (Red)    Row Limitations  3x10 reps    Diagonals  AROM;Right;20 reps D1 and D2    Other Standing Exercises  L shoulder scaption 2# x20 reps      Shoulder Exercises: ROM/Strengthening   UBE (Upper Arm Bike)  60 RPM x6 min    Wall Pushups  20 reps      Modalities   Modalities  Electrical Stimulation;Vasopneumatic      Electrical Stimulation   Electrical Stimulation Location  L shoulder    Electrical Stimulation Action  IFC    Electrical Stimulation Parameters  1-10 hz x15 min    Electrical Stimulation Goals  Pain      Vasopneumatic   Number Minutes Vasopneumatic   15 minutes    Vasopnuematic Location   Shoulder    Vasopneumatic Pressure  Low    Vasopneumatic Temperature   34               PT Short Term Goals - 11/06/17 1508      PT SHORT TERM GOAL #1   Title  STG's=LTG's.        PT Long Term Goals - 01/29/18 2836      PT LONG TERM GOAL #1   Title  Independent with a HEP.    Time  4    Period  Weeks    Status  Achieved      PT LONG TERM GOAL #2   Title  Active left shoulder flexion to 145 degrees so the patient can easily reach overhead.  Time  4    Period   Weeks    Status  Not Met L shoulder AROM flexion 136 deg 01/29/2018      PT LONG TERM GOAL #3   Title  Active ER to 70 degrees+ to allow for easily donning/doffing of apparel.    Time  4    Period  Weeks    Status  Achieved L shoulder AROM ER 78 deg 01/29/2018      PT LONG TERM GOAL #4   Title  Increase ROM so patient is able to reach behind back.    Time  4    Period  Weeks    Status  Achieved      PT LONG TERM GOAL #5   Title  Increase left shoulder strength to a solid 4+/5 to increase stability for performance of functional activities.    Time  4    Period  Weeks    Status  Partially Met L shoulder MMT 4/5-4+/5 as of 01/29/2018      PT LONG TERM GOAL #6   Title  Perform ADL's with pain not > 3/10.    Status  Achieved            Plan - 01/29/18 0854    Clinical Impression Statement  Patient tolerated today's treatment well with strengthening exercises as resistance increased with prone exercises. Increased reps were instructed also today without complaint from patient. MMT of L shoulder 4/5-4+/5. L shoulder flexion AROM measured as 136 deg, ER 78 deg. Normal modalities response noted following removal of the modalties. Patient able to partially achieve L shoulder MMT goal but unable to meet L shoulder flexion goal at this time.    Rehab Potential  Excellent    PT Frequency  3x / week    PT Duration  4 weeks    PT Treatment/Interventions  ADLs/Self Care Home Management;Cryotherapy;Electrical Stimulation;Ultrasound;Moist Heat;Therapeutic activities;Therapeutic exercise;Patient/family education;Neuromuscular re-education;Manual techniques;Passive range of motion;Vasopneumatic Device    PT Next Visit Plan  Continue or D/C per MD discretion.    PT Home Exercise Plan  HEP- RW4 with yellow theraband    Consulted and Agree with Plan of Care  Patient       Patient will benefit from skilled therapeutic intervention in order to improve the following deficits and impairments:   Decreased activity tolerance, Decreased range of motion, Pain, Hypomobility  Visit Diagnosis: Chronic left shoulder pain  Stiffness of left shoulder, not elsewhere classified     Problem List Patient Active Problem List   Diagnosis Date Noted  . Chronic night sweats 07/31/2016  . DDD (degenerative disc disease), cervical 07/29/2015  . Prediabetes 06/30/2013  . Erectile dysfunction 06/30/2013  . Routine general medical examination at a health care facility 04/12/2011  . ESOPHAGEAL STRICTURE 12/14/2008  . Gastritis and gastroduodenitis 12/14/2008  . BPH associated with nocturia 12/03/2008  . Osteoarthrosis, hand 12/03/2008  . Hyperlipidemia with target LDL less than 130 09/28/2008    Standley Brooking, PTA 01/29/18 9:10 AM   [facility] [address] [place], [place], [address] Phone: [phone]   Fax:  [phone]  Name: MICHAELA SHANKEL MRN: [id number] Date of Birth: 01-15-1941  PHYSICAL THERAPY DISCHARGE SUMMARY  Visits from Start of Care:   Current functional level related to goals / functional outcomes: See above.   Remaining deficits: Excellent progress.  See goal section.   Education / Equipment: HEP. Plan: Patient agrees to discharge.  Patient goals were partially met. Patient  is being discharged due to being pleased with the current functional level.  ?????          Mali Applegate MPT

## 2018-01-30 DIAGNOSIS — M75122 Complete rotator cuff tear or rupture of left shoulder, not specified as traumatic: Secondary | ICD-10-CM | POA: Diagnosis not present

## 2018-01-31 ENCOUNTER — Encounter: Payer: Medicare Other | Admitting: Physical Therapy

## 2018-02-05 ENCOUNTER — Encounter: Payer: Medicare Other | Admitting: Physical Therapy

## 2018-02-07 ENCOUNTER — Encounter: Payer: Medicare Other | Admitting: Physical Therapy

## 2018-02-11 ENCOUNTER — Other Ambulatory Visit: Payer: Self-pay | Admitting: Internal Medicine

## 2018-07-02 ENCOUNTER — Other Ambulatory Visit: Payer: Self-pay | Admitting: Internal Medicine

## 2018-07-02 ENCOUNTER — Ambulatory Visit (INDEPENDENT_AMBULATORY_CARE_PROVIDER_SITE_OTHER): Payer: Medicare Other | Admitting: *Deleted

## 2018-07-02 DIAGNOSIS — Z23 Encounter for immunization: Secondary | ICD-10-CM | POA: Diagnosis not present

## 2018-08-20 ENCOUNTER — Ambulatory Visit: Payer: Medicare Other | Admitting: Internal Medicine

## 2018-08-28 ENCOUNTER — Ambulatory Visit (INDEPENDENT_AMBULATORY_CARE_PROVIDER_SITE_OTHER)
Admission: RE | Admit: 2018-08-28 | Discharge: 2018-08-28 | Disposition: A | Discharge status: Home / Self Care | Payer: Medicare Other | Source: Ambulatory Visit | Attending: Internal Medicine | Admitting: Internal Medicine

## 2018-08-28 ENCOUNTER — Ambulatory Visit (INDEPENDENT_AMBULATORY_CARE_PROVIDER_SITE_OTHER): Payer: Medicare Other | Admitting: Internal Medicine

## 2018-08-28 ENCOUNTER — Other Ambulatory Visit (INDEPENDENT_AMBULATORY_CARE_PROVIDER_SITE_OTHER): Payer: Medicare Other

## 2018-08-28 ENCOUNTER — Encounter: Payer: Self-pay | Admitting: Internal Medicine

## 2018-08-28 VITALS — BP 164/84 | HR 55 | Temp 97.7°F | Resp 16 | Ht 66.5 in | Wt 151.0 lb

## 2018-08-28 DIAGNOSIS — E785 Hyperlipidemia, unspecified: Secondary | ICD-10-CM

## 2018-08-28 DIAGNOSIS — R0789 Other chest pain: Secondary | ICD-10-CM

## 2018-08-28 DIAGNOSIS — N401 Enlarged prostate with lower urinary tract symptoms: Secondary | ICD-10-CM | POA: Diagnosis not present

## 2018-08-28 DIAGNOSIS — R7303 Prediabetes: Secondary | ICD-10-CM | POA: Diagnosis not present

## 2018-08-28 DIAGNOSIS — I10 Essential (primary) hypertension: Secondary | ICD-10-CM

## 2018-08-28 DIAGNOSIS — Z0001 Encounter for general adult medical examination with abnormal findings: Secondary | ICD-10-CM | POA: Diagnosis not present

## 2018-08-28 DIAGNOSIS — R351 Nocturia: Secondary | ICD-10-CM

## 2018-08-28 DIAGNOSIS — Z23 Encounter for immunization: Secondary | ICD-10-CM

## 2018-08-28 DIAGNOSIS — R05 Cough: Secondary | ICD-10-CM

## 2018-08-28 DIAGNOSIS — R059 Cough, unspecified: Secondary | ICD-10-CM

## 2018-08-28 DIAGNOSIS — K2990 Gastroduodenitis, unspecified, without bleeding: Secondary | ICD-10-CM

## 2018-08-28 DIAGNOSIS — M25551 Pain in right hip: Secondary | ICD-10-CM | POA: Diagnosis not present

## 2018-08-28 DIAGNOSIS — K2970 Gastritis, unspecified, without bleeding: Secondary | ICD-10-CM

## 2018-08-28 DIAGNOSIS — G8929 Other chronic pain: Secondary | ICD-10-CM | POA: Insufficient documentation

## 2018-08-28 DIAGNOSIS — Z0000 Encounter for general adult medical examination without abnormal findings: Secondary | ICD-10-CM

## 2018-08-28 DIAGNOSIS — R079 Chest pain, unspecified: Secondary | ICD-10-CM | POA: Diagnosis not present

## 2018-08-28 LAB — COMPREHENSIVE METABOLIC PANEL
ALT: 37 U/L (ref 0–53)
AST: 28 U/L (ref 0–37)
Albumin: 4.5 g/dL (ref 3.5–5.2)
Alkaline Phosphatase: 82 U/L (ref 39–117)
BILIRUBIN TOTAL: 0.8 mg/dL (ref 0.2–1.2)
BUN: 18 mg/dL (ref 6–23)
CO2: 31 meq/L (ref 19–32)
Calcium: 9.2 mg/dL (ref 8.4–10.5)
Chloride: 104 mEq/L (ref 96–112)
Creatinine, Ser: 0.93 mg/dL (ref 0.40–1.50)
GFR: 83.58 mL/min (ref >60.00)
Glucose, Bld: 103 mg/dL — ABNORMAL HIGH (ref 70–99)
Potassium: 4.1 mEq/L (ref 3.5–5.1)
Sodium: 141 mEq/L (ref 135–145)
Total Protein: 6.7 g/dL (ref 6.0–8.3)

## 2018-08-28 LAB — URINALYSIS, ROUTINE W REFLEX MICROSCOPIC
Bilirubin Urine: NEGATIVE
Hgb urine dipstick: NEGATIVE
KETONES UR: NEGATIVE
LEUKOCYTES UA: NEGATIVE
NITRITE: NEGATIVE
RBC / HPF: NONE SEEN (ref 0–?)
Specific Gravity, Urine: 1.01 (ref 1.000–1.030)
Total Protein, Urine: NEGATIVE
Urine Glucose: NEGATIVE
Urobilinogen, UA: 0.2 (ref 0.0–1.0)
WBC, UA: NONE SEEN (ref 0–?)
pH: 6.5 (ref 5.0–8.0)

## 2018-08-28 LAB — BASIC METABOLIC PANEL
BUN: 18 mg/dL (ref 6–23)
CO2: 31 mEq/L (ref 19–32)
Calcium: 9.2 mg/dL (ref 8.4–10.5)
Chloride: 104 mEq/L (ref 96–112)
Creatinine, Ser: 0.93 mg/dL (ref 0.40–1.50)
GFR: 83.58 mL/min (ref >60.00)
Glucose, Bld: 103 mg/dL — ABNORMAL HIGH (ref 70–99)
Potassium: 4.1 mEq/L (ref 3.5–5.1)
SODIUM: 141 meq/L (ref 135–145)

## 2018-08-28 LAB — CBC WITH DIFFERENTIAL/PLATELET
Basophils Absolute: 0.1 10*3/uL (ref 0.0–0.1)
Basophils Relative: 0.8 % (ref 0.0–3.0)
Eosinophils Absolute: 0.3 10*3/uL (ref 0.0–0.7)
Eosinophils Relative: 4.7 % (ref 0.0–5.0)
HEMATOCRIT: 43.8 % (ref 39.0–52.0)
Hemoglobin: 15 g/dL (ref 13.0–17.0)
Lymphocytes Relative: 30.1 % (ref 12.0–46.0)
Lymphs Abs: 1.9 10*3/uL (ref 0.7–4.0)
MCHC: 34.3 g/dL (ref 30.0–36.0)
MCV: 91 fl (ref 78.0–100.0)
Monocytes Absolute: 0.8 10*3/uL (ref 0.1–1.0)
Monocytes Relative: 12.1 % — ABNORMAL HIGH (ref 3.0–12.0)
Neutro Abs: 3.4 10*3/uL (ref 1.4–7.7)
Neutrophils Relative %: 52.3 % (ref 43.0–77.0)
Platelets: 223 10*3/uL (ref 150.0–400.0)
RBC: 4.81 Mil/uL (ref 4.22–5.81)
RDW: 13.1 % (ref 11.5–15.5)
WBC: 6.4 10*3/uL (ref 4.0–10.5)

## 2018-08-28 LAB — LIPID PANEL
CHOL/HDL RATIO: 3
Cholesterol: 167 mg/dL (ref 0–200)
HDL: 54.9 mg/dL (ref >39.00)
LDL Cholesterol: 97 mg/dL (ref 0–99)
NONHDL: 112.03
Triglycerides: 75 mg/dL (ref 0.0–149.0)
VLDL: 15 mg/dL (ref 0.0–40.0)

## 2018-08-28 LAB — TSH: TSH: 2.74 u[IU]/mL (ref 0.35–4.50)

## 2018-08-28 LAB — PSA: PSA: 0.58 ng/mL (ref 0.10–4.00)

## 2018-08-28 LAB — HEMOGLOBIN A1C: HEMOGLOBIN A1C: 6.2 % (ref 4.6–6.5)

## 2018-08-28 LAB — TROPONIN I: TNIDX: 0.01 ug/L (ref 0.00–0.06)

## 2018-08-28 MED ORDER — ZOSTER VAC RECOMB ADJUVANTED 50 MCG/0.5ML IM SUSR: 0.5000 mL | Freq: Once | INTRAMUSCULAR | 1 refills | Status: AC

## 2018-08-28 MED ORDER — AZILSARTAN MEDOXOMIL 40 MG PO TABS: 1.0000 | ORAL_TABLET | Freq: Every day | ORAL | 0 refills | Status: DC

## 2018-08-28 NOTE — Patient Instructions (Signed)

## 2018-08-28 NOTE — Progress Notes (Signed)
Subjective:  Patient ID: Don Johnson, male    DOB: Jan 09, 1941  Age: 77 y.o. MRN: [id number]  CC: Annual Exam; Hypertension; and Cough   HPI Don Johnson presents for a CPX.  He complains of a 2-year history of right hip pain with activity.  He denies trauma or injury.  He is taking Tylenol for the discomfort.  He also complains of an intermittent right-sided chest pain with activity that he describes as a mild pressure.  He also complains of a chronic nonproductive cough.  Past Medical History:  Diagnosis Date  . Bilateral recurrent inguinal hernia   . BPH with obstruction/lower urinary tract symptoms   . Cough 09/20/2016  . DDD (degenerative disc disease), cervical   . Diverticulosis of colon   . GERD (gastroesophageal reflux disease)   . Hiatal hernia   . History of adenomatous polyp of colon    2006 tubular adenoma;  2010 tubular adenoma and hyperplastic  . Hyperlipidemia   . OA (osteoarthritis)    hands  . Organic sexual dysfunction   . Pre-diabetes   . S/P dilatation of esophageal stricture    multiple times --  last time w/ balloon 12-31-2015  . Wears dentures    upper denture, lower partial  . Wears glasses    Past Surgical History:  Procedure Laterality Date  . COLONOSCOPY  last one 03-14-2011  . ESOPHAGOGASTRODUODENOSCOPY (EGD) WITH ESOPHAGEAL DILATION  last one 12-31-2015  . HEMORROIDECTOMY  1970's  . INGUINAL HERNIA REPAIR Bilateral 1980's  . PENILE PROSTHESIS IMPLANT N/A 09/29/2016   Procedure: PENILE PROTHESIS INFLATABLE;  Surgeon: Carolan Clines, MD;  Location: [facility];  Service: Urology;  Laterality: N/A;  . UMBILICAL HERNIA REPAIR  2007 approx    reports that he has never smoked. He has never used smokeless tobacco. He reports that he does not drink alcohol or use drugs. family history includes Arthritis in his mother; COPD in his brother; Colon polyps in his mother; Diabetes in his mother and sister; Hyperlipidemia in his  mother; Hypertension in his mother; Leukemia in his brother; Pancreatic cancer in his brother. Not on File  Outpatient Medications Prior to Visit  Medication Sig Dispense Refill  . aspirin EC 81 MG tablet Take 81 mg by mouth. Take 2 tablets daily    . atorvastatin (LIPITOR) 40 MG tablet Take 1 tablet (40 mg total) by mouth daily. 90 tablet 1  . Multiple Vitamins-Minerals (CENTRUM SILVER PO) Take by mouth daily.    Marland Kitchen omeprazole (PRILOSEC) 40 MG capsule TAKE 1 CAPSULE EVERY DAY 30 capsule 1  . amoxicillin-clavulanate (AUGMENTIN) 875-125 MG tablet Take 1 tablet by mouth 2 (two) times daily. 20 tablet 0  . benzonatate (TESSALON) 100 MG capsule Take 1 capsule (100 mg total) by mouth 3 (three) times daily as needed. 30 capsule 0  . fluticasone (FLONASE) 50 MCG/ACT nasal spray Place 2 sprays into both nostrils daily. 16 g 6   No facility-administered medications prior to visit.     ROS Review of Systems  Constitutional: Negative for chills, diaphoresis, fatigue and fever.  HENT: Negative.  Negative for trouble swallowing.   Eyes: Negative.   Respiratory: Positive for cough. Negative for chest tightness, shortness of breath and wheezing.   Cardiovascular: Positive for chest pain. Negative for palpitations and leg swelling.  Gastrointestinal: Negative for abdominal pain, constipation, diarrhea, nausea and vomiting.  Endocrine: Negative.   Genitourinary: Negative.  Negative for difficulty urinating, dysuria, scrotal swelling, testicular pain and urgency.  Musculoskeletal: Negative.  Negative for arthralgias and myalgias.  Skin: Negative.  Negative for color change and pallor.  Neurological: Negative.  Negative for dizziness, weakness, light-headedness and numbness.  Hematological: Negative for adenopathy. Does not bruise/bleed easily.  Psychiatric/Behavioral: Negative.     Objective:  BP (!) 164/84 (BP Location: Left Arm, Patient Position: Sitting, Cuff Size: Normal) Comment: BP (R) 164/84  (L) 162/88  Pulse (!) 55   Temp 97.7 F (36.5 C) (Oral)   Resp 16   Ht 5' 6.5" (1.689 m)   Wt 151 lb (68.5 kg)   SpO2 99%   BMI 24.01 kg/m   BP Readings from Last 3 Encounters:  08/28/18 (!) 164/84  01/21/18 (!) 150/78  08/15/17 140/84    Wt Readings from Last 3 Encounters:  08/28/18 151 lb (68.5 kg)  01/21/18 149 lb (67.6 kg)  08/15/17 146 lb (66.2 kg)    Physical Exam Vitals signs reviewed.  Constitutional:      General: He is not in acute distress.    Appearance: Normal appearance. He is not ill-appearing, toxic-appearing or diaphoretic.  HENT:     Nose: Nose normal.     Mouth/Throat:     Mouth: Mucous membranes are moist.     Pharynx: No posterior oropharyngeal erythema.  Eyes:     Conjunctiva/sclera: Conjunctivae normal.  Neck:     Musculoskeletal: Normal range of motion and neck supple. No muscular tenderness.  Cardiovascular:     Rate and Rhythm: Normal rate and regular rhythm.     Pulses: Normal pulses.     Heart sounds: Normal heart sounds, S1 normal and S2 normal. No murmur. No systolic murmur. No diastolic murmur. No gallop.      Comments: EKG --  Atrial  Bradycardia  P:QRS - 1:1, Abnormal P axis, H Rate 51 BORDERLINE RHYTHM Pulmonary:     Effort: Pulmonary effort is normal.     Breath sounds: Normal breath sounds. No wheezing, rhonchi or rales.  Chest:     Chest wall: No mass, deformity, swelling, tenderness or edema.     Breasts: Breasts are symmetrical.   Abdominal:     General: Bowel sounds are normal.     Palpations: There is no hepatomegaly, splenomegaly or mass.     Tenderness: There is no abdominal tenderness. There is no guarding.     Hernia: There is no hernia in the right inguinal area or left inguinal area.  Genitourinary:    Pubic Area: No rash.      Penis: Uncircumcised. No phimosis, paraphimosis, erythema, discharge or swelling.      Scrotum/Testes: Normal.        Right: Mass, tenderness or swelling not present.        Left:  Mass, tenderness or swelling not present.     Epididymis:     Right: Normal. Not inflamed or enlarged. No mass.     Left: Normal. Not inflamed or enlarged. No mass.     Prostate: Enlarged. Not tender and no nodules present (1+ smooth symm BPH).     Rectum: Normal. Guaiac result negative. No mass, tenderness, anal fissure, external hemorrhoid or internal hemorrhoid. Normal anal tone.     Comments: + prosthesis Musculoskeletal: Normal range of motion.        General: No tenderness or deformity.     Right lower leg: No edema.     Left lower leg: No edema.  Lymphadenopathy:     Upper Body:     Right upper body:  No supraclavicular, axillary or pectoral adenopathy.     Left upper body: No supraclavicular, axillary or pectoral adenopathy.     Lower Body: No right inguinal adenopathy. No left inguinal adenopathy.  Skin:    General: Skin is warm and dry.     Findings: No rash.  Neurological:     General: No focal deficit present.     Mental Status: He is alert and oriented to person, place, and time. Mental status is at baseline.     Lab Results  Component Value Date   WBC 6.3 08/15/2017   HGB 15.4 08/15/2017   HCT 46.7 08/15/2017   PLT 242.0 08/15/2017   GLUCOSE 104 (H) 08/15/2017   CHOL 150 08/15/2017   TRIG 67.0 08/15/2017   HDL 55.50 08/15/2017   LDLDIRECT 149.1 06/30/2013   LDLCALC 81 08/15/2017   ALT 21 08/15/2017   AST 22 08/15/2017   NA 141 08/15/2017   K 4.6 08/15/2017   CL 103 08/15/2017   CREATININE 0.96 08/15/2017   BUN 20 08/15/2017   CO2 31 08/15/2017   TSH 2.50 07/31/2016   PSA 0.62 08/15/2017   HGBA1C 6.3 08/15/2017    Dg Chest 2 View  Result Date: 09/20/2016 CLINICAL DATA:  Sore throat and cough EXAM: CHEST  2 VIEW COMPARISON:  June 04, 2013 FINDINGS: There is no edema or consolidation. The heart size and pulmonary vascularity are normal. No adenopathy. There is a small hiatal hernia. There is atherosclerotic calcification in the aorta. No bone  lesions. IMPRESSION: No edema or consolidation. Aortic atherosclerosis. Small hiatal hernia. Electronically Signed   By: Lowella Grip III M.D.   On: 09/20/2016 16:01    Assessment & Plan:   Quantarius was seen today for annual exam, hypertension and cough.  Diagnoses and all orders for this visit:  BPH associated with nocturia- His PSA is low.  This is reassuring that he does not have prostate cancer. -     PSA; Future  Hyperlipidemia with target LDL less than 130- He has achieved his LDL goal and is doing well on the statin. -     Lipid panel; Future -     TSH; Future  Prediabetes- His A1c is at 6.3%.  Medical therapy is not indicated. -     Comprehensive metabolic panel; Future -     Hemoglobin A1c; Future  Gastritis and gastroduodenitis- His symptoms are well controlled with the PPI. -     CBC with Differential/Platelet; Future  Need for shingles vaccine -     Zoster Vaccine Adjuvanted [facility]) injection; Inject 0.5 mLs into the muscle once for 1 dose.  Cough- His chest x-ray is negative for mass or infiltrate. -     DG Chest 2 View; Future  Pressure in right side of chest- His exam and chest x-ray are normal.  Troponin and d-dimer are normal.  EKG is negative for ischemia or LVH.  I do not think the pain is related to cardiopulmonary disease. -     Troponin I -; Future -     DG Chest 2 View; Future -     EKG 12-Lead -     D-dimer, quantitative (not at [facility]); Future  Chronic right hip pain -     Ambulatory referral to Sports Medicine  Essential hypertension- His BP is not adequately well controlled.  His labs are negative for secondary causes or endorgan damage.  His EKG is negative for ischemia or LVH.  I have asked him to  start taking an ARB. -     Basic metabolic panel; Future -     Urinalysis, Routine w reflex microscopic; Future -     EKG 12-Lead -     Azilsartan Medoxomil (EDARBI) 40 MG TABS; Take 1 tablet by mouth daily.   I have discontinued [name].  Smead's amoxicillin-clavulanate, fluticasone, and benzonatate. I am also having him start on Zoster Vaccine Adjuvanted and Azilsartan Medoxomil. Additionally, I am having him maintain his aspirin EC, Multiple Vitamins-Minerals (CENTRUM SILVER PO), atorvastatin, and omeprazole.  Meds ordered this encounter  Medications  . Zoster Vaccine Adjuvanted [facility]) injection    Sig: Inject 0.5 mLs into the muscle once for 1 dose.    Dispense:  0.5 mL    Refill:  1  . Azilsartan Medoxomil (EDARBI) 40 MG TABS    Sig: Take 1 tablet by mouth daily.    Dispense:  56 tablet    Refill:  0   See AVS for instructions about healthy living and anticipatory guidance.  Follow-up: Return in about 6 weeks (around 10/09/2018).  Scarlette Calico, MD

## 2018-08-29 ENCOUNTER — Encounter: Payer: Self-pay | Admitting: Internal Medicine

## 2018-08-29 LAB — D-DIMER, QUANTITATIVE: D-Dimer, Quant: 0.24 mcg/mL FEU (ref <0.50)

## 2018-08-30 NOTE — Assessment & Plan Note (Signed)

## 2018-09-09 ENCOUNTER — Other Ambulatory Visit: Payer: Self-pay | Admitting: Internal Medicine

## 2018-09-09 ENCOUNTER — Other Ambulatory Visit: Payer: Self-pay

## 2018-09-09 DIAGNOSIS — E785 Hyperlipidemia, unspecified: Secondary | ICD-10-CM

## 2018-09-09 MED ORDER — OMEPRAZOLE 40 MG PO CPDR: 40.0000 mg | DELAYED_RELEASE_CAPSULE | Freq: Every day | ORAL | 1 refills | Status: DC

## 2018-09-23 ENCOUNTER — Encounter: Payer: Self-pay | Admitting: Nurse Practitioner

## 2018-09-23 ENCOUNTER — Ambulatory Visit: Payer: Self-pay

## 2018-09-23 ENCOUNTER — Ambulatory Visit (INDEPENDENT_AMBULATORY_CARE_PROVIDER_SITE_OTHER): Payer: Medicare Other | Admitting: Nurse Practitioner

## 2018-09-23 VITALS — BP 158/80 | HR 68 | Temp 98.6°F | Ht 66.5 in | Wt 146.0 lb

## 2018-09-23 DIAGNOSIS — R6889 Other general symptoms and signs: Secondary | ICD-10-CM | POA: Diagnosis not present

## 2018-09-23 MED ORDER — HYDROCODONE-HOMATROPINE 5-1.5 MG/5ML PO SYRP: 5.0000 mL | ORAL_SOLUTION | Freq: Two times a day (BID) | ORAL | 0 refills | Status: DC | PRN

## 2018-09-23 MED ORDER — OSELTAMIVIR PHOSPHATE 75 MG PO CAPS: 75.0000 mg | ORAL_CAPSULE | Freq: Two times a day (BID) | ORAL | 0 refills | Status: DC

## 2018-09-23 MED ORDER — PREDNISONE 20 MG PO TABS: 40.0000 mg | ORAL_TABLET | Freq: Every day | ORAL | 0 refills | Status: DC

## 2018-09-23 MED ORDER — GUAIFENESIN ER 600 MG PO TB12: 600.0000 mg | ORAL_TABLET | Freq: Two times a day (BID) | ORAL | 0 refills | Status: DC | PRN

## 2018-09-23 MED ORDER — FLUTICASONE PROPIONATE 50 MCG/ACT NA SUSP: 2.0000 | Freq: Every day | NASAL | 0 refills | Status: DC

## 2018-09-23 NOTE — Progress Notes (Signed)
Subjective:  Patient ID: Don Johnson, male    DOB: 22-Apr-1941  Age: 78 y.o. MRN: [id number]  CC: Cough (non productive cough, fatigue, sweats and chills/ lasting 4 days/son dx with flu 09/23/18/ aspirin , musinex ,robutussin)   URI   This is a new problem. The current episode started in the past 7 days. The problem has been unchanged. Associated symptoms include congestion, coughing, headaches, joint pain, rhinorrhea, sinus pain, sneezing and a sore throat. He has tried acetaminophen and decongestant for the symptoms. The treatment provided no relief.  Son diagnosed with flu 09/23/2018. Spent weekend with him. Influenza vaccine given 06/2018  Reviewed past Medical, Social and Family history today.  Outpatient Medications Prior to Visit  Medication Sig Dispense Refill  . aspirin EC 81 MG tablet Take 81 mg by mouth. Take 2 tablets daily    . atorvastatin (LIPITOR) 40 MG tablet Take 1 tablet (40 mg total) by mouth daily. 90 tablet 1  . Azilsartan Medoxomil (EDARBI) 40 MG TABS Take 1 tablet by mouth daily. 56 tablet 0  . Multiple Vitamins-Minerals (CENTRUM SILVER PO) Take by mouth daily.    Marland Kitchen omeprazole (PRILOSEC) 40 MG capsule Take 1 capsule (40 mg total) by mouth daily. 30 capsule 1   No facility-administered medications prior to visit.     ROS See HPI  Objective:  BP (!) 158/80   Pulse 68   Temp 98.6 F (37 C) (Oral)   Ht 5' 6.5" (1.689 m)   Wt 146 lb (66.2 kg)   SpO2 96%   BMI 23.21 kg/m   BP Readings from Last 3 Encounters:  09/23/18 (!) 158/80  08/28/18 (!) 164/84  01/21/18 (!) 150/78    Wt Readings from Last 3 Encounters:  09/23/18 146 lb (66.2 kg)  08/28/18 151 lb (68.5 kg)  01/21/18 149 lb (67.6 kg)    Physical Exam Vitals signs reviewed.  HENT:     Right Ear: Tympanic membrane, ear canal and external ear normal.     Left Ear: Tympanic membrane, ear canal and external ear normal.     Nose: Mucosal edema and rhinorrhea present.     Right Turbinates:  Swollen.     Left Turbinates: Swollen.     Right Sinus: Maxillary sinus tenderness present.     Left Sinus: Maxillary sinus tenderness present.     Mouth/Throat:     Pharynx: Posterior oropharyngeal erythema present. No oropharyngeal exudate.  Cardiovascular:     Rate and Rhythm: Normal rate and regular rhythm.     Pulses: Normal pulses.     Heart sounds: Normal heart sounds.  Pulmonary:     Effort: Pulmonary effort is normal.     Breath sounds: Normal breath sounds.  Neurological:     Mental Status: He is alert and oriented to person, place, and time.  Psychiatric:        Mood and Affect: Mood normal.        Behavior: Behavior normal.     Lab Results  Component Value Date   WBC 6.4 08/28/2018   HGB 15.0 08/28/2018   HCT 43.8 08/28/2018   PLT 223.0 08/28/2018   GLUCOSE 103 (H) 08/28/2018   GLUCOSE 103 (H) 08/28/2018   CHOL 167 08/28/2018   TRIG 75.0 08/28/2018   HDL 54.90 08/28/2018   LDLDIRECT 149.1 06/30/2013   LDLCALC 97 08/28/2018   ALT 37 08/28/2018   AST 28 08/28/2018   NA 141 08/28/2018   NA 141 08/28/2018   K  4.1 08/28/2018   K 4.1 08/28/2018   CL 104 08/28/2018   CL 104 08/28/2018   CREATININE 0.93 08/28/2018   CREATININE 0.93 08/28/2018   BUN 18 08/28/2018   BUN 18 08/28/2018   CO2 31 08/28/2018   CO2 31 08/28/2018   TSH 2.74 08/28/2018   PSA 0.58 08/28/2018   HGBA1C 6.2 08/28/2018    Dg Chest 2 View  Result Date: 08/28/2018 CLINICAL DATA:  RIGHT chest pain and cough for 2 weeks, history of hiatal hernia EXAM: CHEST - 2 VIEW COMPARISON:  09/20/2016 FINDINGS: Normal heart size and pulmonary vascularity. Minimal fullness of RIGHT paratracheal soft tissues unchanged since 06/04/2013. Lungs mildly hyperinflated but clear. No pulmonary infiltrate, pleural effusion or pneumothorax. Eventration of the LEFT diaphragm focally unchanged. No acute osseous findings. IMPRESSION: Hyperinflation without acute infiltrate. Electronically Signed   By: Lavonia Dana M.D.    On: 08/28/2018 13:56    Assessment & Plan:   Aveon was seen today for cough.  Diagnoses and all orders for this visit:  Flu-like symptoms -     oseltamivir (TAMIFLU) 75 MG capsule; Take 1 capsule (75 mg total) by mouth 2 (two) times daily. -     HYDROcodone-homatropine (HYCODAN) 5-1.5 MG/5ML syrup; Take 5 mLs by mouth every 12 (twelve) hours as needed. -     guaiFENesin (MUCINEX) 600 MG 12 hr tablet; Take 1 tablet (600 mg total) by mouth 2 (two) times daily as needed for cough or to loosen phlegm. -     fluticasone (FLONASE) 50 MCG/ACT nasal spray; Place 2 sprays into both nostrils daily. -     predniSONE (DELTASONE) 20 MG tablet; Take 2 tablets (40 mg total) by mouth daily with breakfast.   I am having [name]. Ruggiero start on oseltamivir, HYDROcodone-homatropine, guaiFENesin, fluticasone, and predniSONE. I am also having him maintain his aspirin EC, Multiple Vitamins-Minerals (CENTRUM SILVER PO), Azilsartan Medoxomil, atorvastatin, and omeprazole.  Meds ordered this encounter  Medications  . oseltamivir (TAMIFLU) 75 MG capsule    Sig: Take 1 capsule (75 mg total) by mouth 2 (two) times daily.    Dispense:  10 capsule    Refill:  0    Order Specific Question:   Supervising Provider    Answer:   MATTHEWS, CODY [[id number]]  . HYDROcodone-homatropine (HYCODAN) 5-1.5 MG/5ML syrup    Sig: Take 5 mLs by mouth every 12 (twelve) hours as needed.    Dispense:  60 mL    Refill:  0    Order Specific Question:   Supervising Provider    Answer:   MATTHEWS, CODY [[id number]]  . guaiFENesin (MUCINEX) 600 MG 12 hr tablet    Sig: Take 1 tablet (600 mg total) by mouth 2 (two) times daily as needed for cough or to loosen phlegm.    Dispense:  14 tablet    Refill:  0    Order Specific Question:   Supervising Provider    Answer:   MATTHEWS, CODY [[id number]]  . fluticasone (FLONASE) 50 MCG/ACT nasal spray    Sig: Place 2 sprays into both nostrils daily.    Dispense:  16 g    Refill:  0    Order Specific  Question:   Supervising Provider    Answer:   MATTHEWS, CODY [[id number]]  . predniSONE (DELTASONE) 20 MG tablet    Sig: Take 2 tablets (40 mg total) by mouth daily with breakfast.    Dispense:  4 tablet    Refill:  0  Order Specific Question:   Supervising Provider    Answer:   Luetta Nutting [[id number]]    Problem List Items Addressed This Visit    None    Visit Diagnoses    Flu-like symptoms    -  Primary   Relevant Medications   oseltamivir (TAMIFLU) 75 MG capsule   HYDROcodone-homatropine (HYCODAN) 5-1.5 MG/5ML syrup   guaiFENesin (MUCINEX) 600 MG 12 hr tablet   fluticasone (FLONASE) 50 MCG/ACT nasal spray   predniSONE (DELTASONE) 20 MG tablet       Follow-up: Return if symptoms worsen or fail to improve.  Wilfred Lacy, NP

## 2018-09-23 NOTE — Telephone Encounter (Signed)
Wife called to report pt. with onset of cough, chills, sweats, and generally doesn't feel well, since Saturday, 1/11.  Reported cough is nonproductive, but sounds like it is loose.  Reported intermittent wheeze.  Denied shortness of breath.  Has not checked temperature, due to broken thermometer. C/o eyeballs hurting and body aches.  Reported their son was diagnosed with the flu today.  No available appts. At PCP office.  Appt. Scheduled @ [facility] at 3:30 PM.  Care advice given per protocol.  Verb. Understanding.       Reason for Disposition . SEVERE coughing spells (e.g., whooping sound after coughing, vomiting after coughing)  Answer Assessment - Initial Assessment Questions 1. ONSET: "When did the cough begin?"      Saturday, 1/11  2. SEVERITY: "How bad is the cough today?"      Had been coughing a lot, but has "lightened up" a little   3. RESPIRATORY DISTRESS: "Describe your breathing."      Denied 4. FEVER: "Do you have a fever?" If so, ask: "What is your temperature, how was it measured, and when did it start?"     Intermittent chills and sweats 5. HEMOPTYSIS: "Are you coughing up any blood?" If so ask: "How much?" (flecks, streaks, tablespoons, etc.)     No  6. TREATMENT: "What have you done so far to treat the cough?" (e.g., meds, fluids, humidifier)     Mucinex, ASA, Robitussin cough syrup 7. CARDIAC HISTORY: "Do you have any history of heart disease?" (e.g., heart attack, congestive heart failure)      Denied  8. LUNG HISTORY: "Do you have any history of lung disease?"  (e.g., pulmonary embolus, asthma, emphysema)     Denied  9. PE RISK FACTORS: "Do you have a history of blood clots?" (or: recent major surgery, recent prolonged travel, bedridden)     *No Answer* 10. OTHER SYMPTOMS: "Do you have any other symptoms? (e.g., runny nose, wheezing, chest pain)       Intermittent cough, intermittent wheeze ; started with nasal drainage and sneezing that has improved.  C/o  aching and eyeballs hurt.   11. PREGNANCY: "Is there any chance you are pregnant?" "When was your last menstrual period?"      N/a  12. TRAVEL: "Have you traveled out of the country in the last month?" (e.g., travel history, exposures)       N/a  Protocols used: COUGH - ACUTE NON-PRODUCTIVE-A-AH

## 2018-09-23 NOTE — Patient Instructions (Signed)

## 2018-09-30 ENCOUNTER — Ambulatory Visit (INDEPENDENT_AMBULATORY_CARE_PROVIDER_SITE_OTHER): Payer: Medicare Other | Admitting: Internal Medicine

## 2018-09-30 ENCOUNTER — Encounter: Payer: Self-pay | Admitting: Family Medicine

## 2018-09-30 ENCOUNTER — Encounter: Payer: Self-pay | Admitting: Internal Medicine

## 2018-09-30 ENCOUNTER — Ambulatory Visit: Payer: Self-pay

## 2018-09-30 ENCOUNTER — Ambulatory Visit (INDEPENDENT_AMBULATORY_CARE_PROVIDER_SITE_OTHER): Payer: Medicare Other | Admitting: Family Medicine

## 2018-09-30 ENCOUNTER — Ambulatory Visit (INDEPENDENT_AMBULATORY_CARE_PROVIDER_SITE_OTHER)
Admission: RE | Admit: 2018-09-30 | Discharge: 2018-09-30 | Disposition: A | Discharge status: Home / Self Care | Payer: Medicare Other | Source: Ambulatory Visit | Attending: Family Medicine | Admitting: Family Medicine

## 2018-09-30 VITALS — BP 144/80 | HR 64 | Ht 65.5 in | Wt 147.1 lb

## 2018-09-30 VITALS — BP 120/82 | HR 67 | Ht 66.5 in | Wt 147.0 lb

## 2018-09-30 DIAGNOSIS — K219 Gastro-esophageal reflux disease without esophagitis: Secondary | ICD-10-CM

## 2018-09-30 DIAGNOSIS — K222 Esophageal obstruction: Secondary | ICD-10-CM | POA: Diagnosis not present

## 2018-09-30 DIAGNOSIS — M4807 Spinal stenosis, lumbosacral region: Secondary | ICD-10-CM | POA: Diagnosis not present

## 2018-09-30 DIAGNOSIS — M47816 Spondylosis without myelopathy or radiculopathy, lumbar region: Secondary | ICD-10-CM | POA: Diagnosis not present

## 2018-09-30 DIAGNOSIS — M25551 Pain in right hip: Secondary | ICD-10-CM

## 2018-09-30 DIAGNOSIS — G5701 Lesion of sciatic nerve, right lower limb: Secondary | ICD-10-CM | POA: Insufficient documentation

## 2018-09-30 MED ORDER — OMEPRAZOLE 40 MG PO CPDR: 40.0000 mg | DELAYED_RELEASE_CAPSULE | Freq: Every day | ORAL | 11 refills | Status: DC

## 2018-09-30 NOTE — Patient Instructions (Signed)
We have sent the following medications to your pharmacy for you to pick up at your convenience:  Omeprazole.  Please follow up in one year  

## 2018-09-30 NOTE — Assessment & Plan Note (Signed)
Patient is what appears to be more of a piriformis syndrome.  X-rays of patient's hip as well as back ordered today.  Patient does have some mild atrophy of the lower extremities bilaterally that is somewhat concerning.  Patient does strength seems to be symmetric.  Discussed with patient about home exercise, over-the-counter medications, topical anti-inflammatories and icing regimen.  Patient is to call if any worsening symptoms.  Patient will try these conservative therapies and see me again in 3 to 4 weeks

## 2018-09-30 NOTE — Progress Notes (Signed)
HISTORY OF PRESENT ILLNESS:  Don Johnson is a 78 y.o. male with GERD complicated by peptic stricture requiring esophageal dilation and adenomatous colon polyps with a strong family history of colon cancer.  Patient presents today for routine follow-up regarding management of his complicated GERD.  He is maintained on omeprazole 40 mg daily.  On medication he does well without active reflux symptoms.  His last upper endoscopy with balloon dilation of the esophagus (20 mm) was performed April 2017.  The patient tells me that he has had no recurrent dysphasia.  In terms of surveillance colonoscopy his last examination was August 2018.  He has aged out.  His GI review of systems today is entirely negative.  Review of recent blood work from December 2019 finds unremarkable comprehensive metabolic panel and CBC with hemoglobin 15.0.  REVIEW OF SYSTEMS:  All non-GI ROS negative unless otherwise stated in the HPI except for back pain  Past Medical History:  Diagnosis Date  . Bilateral recurrent inguinal hernia   . BPH with obstruction/lower urinary tract symptoms   . Cough 09/20/2016  . DDD (degenerative disc disease), cervical   . Diverticulosis of colon   . GERD (gastroesophageal reflux disease)   . Hiatal hernia   . History of adenomatous polyp of colon    2006 tubular adenoma;  2010 tubular adenoma and hyperplastic  . Hyperlipidemia   . OA (osteoarthritis)    hands  . Organic sexual dysfunction   . Pre-diabetes   . S/P dilatation of esophageal stricture    multiple times --  last time w/ balloon 12-31-2015  . Wears dentures    upper denture, lower partial  . Wears glasses     Past Surgical History:  Procedure Laterality Date  . COLONOSCOPY  last one 03-14-2011  . ESOPHAGOGASTRODUODENOSCOPY (EGD) WITH ESOPHAGEAL DILATION  last one 12-31-2015  . HEMORROIDECTOMY  1970's  . INGUINAL HERNIA REPAIR Bilateral 1980's  . PENILE PROSTHESIS IMPLANT N/A 09/29/2016   Procedure: PENILE  PROTHESIS INFLATABLE;  Surgeon: Carolan Clines, MD;  Location: [facility];  Service: Urology;  Laterality: N/A;  . UMBILICAL HERNIA REPAIR  2007 approx    Social History Don Johnson  reports that he has never smoked. He has never used smokeless tobacco. He reports that he does not drink alcohol or use drugs.  family history includes Arthritis in his mother; COPD in his brother; Colon polyps in his mother; Diabetes in his mother and sister; Hyperlipidemia in his mother; Hypertension in his mother; Leukemia in his brother; Pancreatic cancer in his brother.  No Known Allergies     PHYSICAL EXAMINATION: Vital signs: BP (!) 144/80 (BP Location: Left Arm, Patient Position: Sitting, Cuff Size: Normal)   Pulse 64   Ht 5' 5.5" (1.664 m)   Wt 147 lb 2 oz (66.7 kg)   BMI 24.11 kg/m   Constitutional: generally well-appearing, no acute distress Psychiatric: alert and oriented x3, cooperative Eyes: extraocular movements intact, anicteric, conjunctiva pink Mouth: oral pharynx moist, no lesions Neck: supple no lymphadenopathy Cardiovascular: heart regular rate and rhythm, no murmur Lungs: clear to auscultation bilaterally Abdomen: soft, nontender, nondistended, no obvious ascites, no peritoneal signs, normal bowel sounds, no organomegaly Rectal: Omitted Extremities: no rubbing, cyanosis, or lower extremity edema bilaterally Skin: no lesions on visible extremities Neuro: No focal deficits.  Cranial nerves intact  ASSESSMENT:  1.  GERD complicated by peptic stricture requiring esophageal dilation.  The patient is asymptomatic post dilation on PPI 2.  Chronic  PPI use.  Discussed risks associated with long-term PPI use. 3.  Personal history of adenomatous colon polyps and strong family history of colon cancer.  Last colonoscopy 2018.  The patient has aged out of surveillance   PLAN:  1.  Reflux precautions 2.  Refill omeprazole 40 mg daily.  Multiple refills 3.   Routine office follow-up 1 year 4.  Contact the office in the interim for any questions or problems

## 2018-09-30 NOTE — Patient Instructions (Signed)
Good to see you  Ice is your friend We will get xray as well  Stay active  Tennis ball in back right pocket with a lot of sitting.  Exercises 3 times a week.  Over the counter get  Vitamin D 2000 Iu daily  Tart cherry extract any dose at night See me again in 4-6 weeks to make sure you are doing well

## 2018-09-30 NOTE — Progress Notes (Signed)
[facility] [address] [place], [place] [address] Phone: [phone] Subjective:    I'm seeing this patient by the request  of:  Janith Lima, MD   CC: Right hip pain  BTD:[id number]  Don Johnson is a 78 y.o. male coming in with complaint of right hip pain. Sitting makes his pain go away. No numbness and tingling noted in the toes. States his pain is so bad he can hardly move. History of falling out of an apple tree.  Onset- Chronic Location- lateral Duration-  Character-  Aggravating factors-  Reliving factors-  Therapies tried-  Severity-     Past Medical History:  Diagnosis Date  . Bilateral recurrent inguinal hernia   . BPH with obstruction/lower urinary tract symptoms   . Cough 09/20/2016  . DDD (degenerative disc disease), cervical   . Diverticulosis of colon   . GERD (gastroesophageal reflux disease)   . Hiatal hernia   . History of adenomatous polyp of colon    2006 tubular adenoma;  2010 tubular adenoma and hyperplastic  . Hyperlipidemia   . OA (osteoarthritis)    hands  . Organic sexual dysfunction   . Pre-diabetes   . S/P dilatation of esophageal stricture    multiple times --  last time w/ balloon 12-31-2015  . Wears dentures    upper denture, lower partial  . Wears glasses    Past Surgical History:  Procedure Laterality Date  . COLONOSCOPY  last one 03-14-2011  . ESOPHAGOGASTRODUODENOSCOPY (EGD) WITH ESOPHAGEAL DILATION  last one 12-31-2015  . HEMORROIDECTOMY  1970's  . INGUINAL HERNIA REPAIR Bilateral 1980's  . PENILE PROSTHESIS IMPLANT N/A 09/29/2016   Procedure: PENILE PROTHESIS INFLATABLE;  Surgeon: Carolan Clines, MD;  Location: [facility];  Service: Urology;  Laterality: N/A;  . UMBILICAL HERNIA REPAIR  2007 approx   Social History   Socioeconomic History  . Marital status: Married    Spouse name: Not on file  . Number of children: 1  . Years of education: Not on file  .  Highest education level: Not on file  Occupational History  . Occupation: Retired  Scientific laboratory technician  . Financial resource strain: Not on file  . Food insecurity:    Worry: Not on file    Inability: Not on file  . Transportation needs:    Medical: Not on file    Non-medical: Not on file  Tobacco Use  . Smoking status: Never Smoker  . Smokeless tobacco: Never Used  Substance and Sexual Activity  . Alcohol use: No    Alcohol/week: 0.0 standard drinks  . Drug use: No  . Sexual activity: Not on file  Lifestyle  . Physical activity:    Days per week: Not on file    Minutes per session: Not on file  . Stress: Not on file  Relationships  . Social connections:    Talks on phone: Not on file    Gets together: Not on file    Attends religious service: Not on file    Active member of club or organization: Not on file    Attends meetings of clubs or organizations: Not on file    Relationship status: Not on file  Other Topics Concern  . Not on file  Social History Narrative   Regular exercise-yes   Daily Caffeine Use-1 1/2 cup coffee in AM   Married, 1 boy   No Known Allergies Family History  Problem Relation Age of  Onset  . Diabetes Mother   . Colon polyps Mother   . Arthritis Mother   . Hypertension Mother   . Hyperlipidemia Mother   . Diabetes Sister   . Leukemia Brother   . COPD Brother   . Pancreatic cancer Brother     Current Outpatient Medications (Endocrine & Metabolic):  .  predniSONE (DELTASONE) 20 MG tablet, Take 2 tablets (40 mg total) by mouth daily with breakfast. (Patient not taking: Reported on 09/30/2018)  Current Outpatient Medications (Cardiovascular):  .  atorvastatin (LIPITOR) 40 MG tablet, Take 1 tablet (40 mg total) by mouth daily. .  Azilsartan Medoxomil (EDARBI) 40 MG TABS, Take 1 tablet by mouth daily. (Patient not taking: Reported on 09/30/2018)  Current Outpatient Medications (Respiratory):  .  fluticasone (FLONASE) 50 MCG/ACT nasal spray, Place 2  sprays into both nostrils daily. (Patient not taking: Reported on 09/30/2018) .  guaiFENesin (MUCINEX) 600 MG 12 hr tablet, Take 1 tablet (600 mg total) by mouth 2 (two) times daily as needed for cough or to loosen phlegm. (Patient not taking: Reported on 09/30/2018) .  HYDROcodone-homatropine (HYCODAN) 5-1.5 MG/5ML syrup, Take 5 mLs by mouth every 12 (twelve) hours as needed. (Patient not taking: Reported on 09/30/2018)  Current Outpatient Medications (Analgesics):  .  aspirin EC 81 MG tablet, Take 81 mg by mouth. Take 2 tablets daily   Current Outpatient Medications (Other):  Marland Kitchen  Multiple Vitamins-Minerals (CENTRUM SILVER PO), Take by mouth daily. Marland Kitchen  omeprazole (PRILOSEC) 40 MG capsule, Take 1 capsule (40 mg total) by mouth daily. Marland Kitchen  oseltamivir (TAMIFLU) 75 MG capsule, Take 1 capsule (75 mg total) by mouth 2 (two) times daily. (Patient not taking: Reported on 09/30/2018)    Past medical history, social, surgical and family history all reviewed in electronic medical record.  No pertanent information unless stated regarding to the chief complaint.   Review of Systems:  No headache, visual changes, nausea, vomiting, diarrhea, constipation, dizziness, abdominal pain, skin rash, fevers, chills, night sweats, weight loss, swollen lymph nodes, body aches, joint swelling, muscle aches, chest pain, shortness of breath, mood changes.   Objective  Blood pressure 120/82, pulse 67, height 5' 6.5" (1.689 m), weight 147 lb (66.7 kg), SpO2 97 %.    General: No apparent distress alert and oriented x3 mood and affect normal, dressed appropriately.  HEENT: Pupils equal, extraocular movements intact  Respiratory: Patient's speak in full sentences and does not appear short of breath  Cardiovascular: No lower extremity edema, non tender, no erythema  Skin: Warm dry intact with no signs of infection or rash on extremities or on axial skeleton.  Abdomen: Soft nontender  Neuro: Cranial nerves II through XII are  intact, neurovascularly intact in all extremities with 2+ DTRs and 2+ pulses.  Lymph: No lymphadenopathy of posterior or anterior cervical chain or axillae bilaterally.  Gait antalgic MSK:  Non tender with full range of motion and good stability and symmetric strength and tone of shoulders, elbows, wrist,  knee and ankles bilaterally.  Hip: Right ROM IR: 25 Deg, ER: 45 Deg, Flexion: 120 Deg, Extension: 100 Deg, Abduction: 45 Deg, Adduction: 15 Deg Strength IR: 5/5, ER: 5/5, Flexion: 5/5, Extension: 5/5, Abduction: 5/5, Adduction: 5/5 Pelvic alignment unremarkable to inspection and palpation. Standing hip rotation and gait without trendelenburg sign / unsteadiness. Pain over the piriformis with mild pain over the greater trochanteric area Mild positive Corky Sox on the right side No SI joint tenderness and normal minimal SI movement.  [id number]; 15 additional  minutes spent for Therapeutic exercises as stated in above notes.  This included exercises focusing on stretching, strengthening, with significant focus on eccentric aspects.   Long term goals include an improvement in range of motion, strength, endurance as well as avoiding reinjury. Patient's frequency would include in 1-2 times a day, 3-5 times a week for a duration of 6-12 weeks. Hip strengthening exercises which included:  Pelvic tilt/bracing to help with proper recruitment of the lower abs and pelvic floor muscles  Glute strengthening to properly contract glutes without over-engaging low back and hamstrings - prone hip extension and glute bridge exercises Proper stretching techniques to increase effectiveness for the hip flexors, groin, quads, piriformic and low back when appropriate    Proper technique shown and discussed handout in great detail with ATC.  All questions were discussed and answered.     Impression and Recommendations:     This case required medical decision making of moderate complexity. The above documentation has been  reviewed and is accurate and complete Lyndal Pulley, DO       Note: This dictation was prepared with Dragon dictation along with smaller phrase technology. Any transcriptional errors that result from this process are unintentional.

## 2018-10-28 ENCOUNTER — Ambulatory Visit: Payer: Medicare Other | Admitting: Family Medicine

## 2018-11-04 ENCOUNTER — Ambulatory Visit (INDEPENDENT_AMBULATORY_CARE_PROVIDER_SITE_OTHER): Payer: Medicare Other | Admitting: Internal Medicine

## 2018-11-04 ENCOUNTER — Encounter (INDEPENDENT_AMBULATORY_CARE_PROVIDER_SITE_OTHER): Payer: Self-pay

## 2018-11-04 ENCOUNTER — Telehealth: Payer: Self-pay | Admitting: Internal Medicine

## 2018-11-04 ENCOUNTER — Encounter: Payer: Self-pay | Admitting: Internal Medicine

## 2018-11-04 VITALS — BP 126/64 | HR 72 | Ht 65.5 in | Wt 143.0 lb

## 2018-11-04 DIAGNOSIS — R197 Diarrhea, unspecified: Secondary | ICD-10-CM | POA: Diagnosis not present

## 2018-11-04 DIAGNOSIS — K219 Gastro-esophageal reflux disease without esophagitis: Secondary | ICD-10-CM | POA: Diagnosis not present

## 2018-11-04 DIAGNOSIS — K625 Hemorrhage of anus and rectum: Secondary | ICD-10-CM | POA: Diagnosis not present

## 2018-11-04 DIAGNOSIS — K222 Esophageal obstruction: Secondary | ICD-10-CM | POA: Diagnosis not present

## 2018-11-04 NOTE — Progress Notes (Signed)
HISTORY OF PRESENT ILLNESS:  Don Johnson is a 78 y.o. male with GERD complicated by peptic stricture requiring esophageal dilation and adenomatous colon polyps as well as a strong family history of colon cancer who was last evaluated in this office 4 weeks ago for routine follow-up.  No problems at that time.  Told to continue PPI and follow-up in 1 year.  Patient was in his usual state of good health until 3 days ago when he developed nonbloody diarrhea.  This occurred intermittently over the next few days.  This morning he noticed some red blood associated with his loose stool.  His last bowel movement several hours ago had small amount of blood.  He was concerned and made this appointment.Marland Kitchen  He denies abdominal pain, fever, or other symptoms.  Hemoglobin from December 2019 was 15.0.  Patient has undergone multiple colonoscopies in 2002, 2006, 2007, 2010, 2013, and most recently August 2018.  At the time of his last examination he was noted to have a diminutive descending colon polyp (adenoma) and pandiverticulosis.  REVIEW OF SYSTEMS:  All non-GI ROS negative unless otherwise stated in the HPI except for back pain  Past Medical History:  Diagnosis Date  . Bilateral recurrent inguinal hernia   . BPH with obstruction/lower urinary tract symptoms   . Cough 09/20/2016  . DDD (degenerative disc disease), cervical   . Diverticulosis of colon   . GERD (gastroesophageal reflux disease)   . Hiatal hernia   . History of adenomatous polyp of colon    2006 tubular adenoma;  2010 tubular adenoma and hyperplastic  . Hyperlipidemia   . OA (osteoarthritis)    hands  . Organic sexual dysfunction   . Pre-diabetes   . S/P dilatation of esophageal stricture    multiple times --  last time w/ balloon 12-31-2015  . Wears dentures    upper denture, lower partial  . Wears glasses     Past Surgical History:  Procedure Laterality Date  . COLONOSCOPY  last one 03-14-2011  . ESOPHAGOGASTRODUODENOSCOPY  (EGD) WITH ESOPHAGEAL DILATION  last one 12-31-2015  . HEMORROIDECTOMY  1970's  . INGUINAL HERNIA REPAIR Bilateral 1980's  . PENILE PROSTHESIS IMPLANT N/A 09/29/2016   Procedure: PENILE PROTHESIS INFLATABLE;  Surgeon: Carolan Clines, MD;  Location: [facility];  Service: Urology;  Laterality: N/A;  . UMBILICAL HERNIA REPAIR  2007 approx    Social History Don Johnson  reports that he has never smoked. He has never used smokeless tobacco. He reports that he does not drink alcohol or use drugs.  family history includes Arthritis in his mother; COPD in his brother; Colon polyps in his mother; Diabetes in his mother and sister; Hyperlipidemia in his mother; Hypertension in his mother; Leukemia in his brother; Pancreatic cancer in his brother.  No Known Allergies     PHYSICAL EXAMINATION: Vital signs: BP 126/64   Pulse 72   Ht 5' 5.5" (1.664 m)   Wt 143 lb (64.9 kg)   BMI 23.43 kg/m   Constitutional: generally well-appearing, no acute distress Psychiatric: alert and oriented x3, cooperative Eyes: extraocular movements intact, anicteric, conjunctiva pink Mouth: oral pharynx moist, no lesions Neck: supple no lymphadenopathy Cardiovascular: heart regular rate and rhythm, no murmur Lungs: clear to auscultation bilaterally Abdomen: soft, nontender, nondistended, no obvious ascites, no peritoneal signs, normal bowel sounds, no organomegaly Rectal: Omitted Extremities: no clubbing, cyanosis, or lower extremity edema bilaterally Skin: no lesions on visible extremities Neuro: No focal deficits.  Cranial  nerves intact  ASSESSMENT:  1.  Infectious diarrhea. 2.  Intermittent minor rectal bleeding associated with diarrhea.  Suspect benign anorectal cause 3.  GERD complicated by peptic stricture.  Asymptomatic post dilation on PPI 4.  Adenomatous colon polyp.  Multiple prior colonoscopies.  Surveillance up-to-date  PLAN:  1.  Expectant management 2.  Reassurance 3.   Continue PPI 4.  Contact the office if his problems persist or worsen.  25-minute spent face-to-face with the patient.  Greater than 50% of the time used for counseling regarding his acute diarrheal illness and rectal bleeding.

## 2018-11-04 NOTE — Telephone Encounter (Signed)
Pt scheduled to see Dr. Henrene Pastor today at 3pm. Pt aware of appt.

## 2018-11-04 NOTE — Patient Instructions (Signed)
Please follow up as needed 

## 2018-11-04 NOTE — Telephone Encounter (Signed)
Pt reported that he is having diarrhea and blood in stool.  This is the second time that it's happened.  Pt requested to be seen ASAP.

## 2018-11-08 ENCOUNTER — Telehealth: Payer: Self-pay | Admitting: Internal Medicine

## 2018-11-08 ENCOUNTER — Other Ambulatory Visit: Payer: Self-pay

## 2018-11-08 DIAGNOSIS — R197 Diarrhea, unspecified: Secondary | ICD-10-CM

## 2018-11-08 NOTE — Telephone Encounter (Signed)
Pt had OV with Dr. Henrene Pastor 2.24.20.  Pt reported that he still has a lot of "pain and pressure."  Please CB.

## 2018-11-08 NOTE — Telephone Encounter (Signed)
Don Johnson pt seen Monday with Diarrhea. Called back and states he has continued to have diarrhea and rectal pressure along with sweating at night the last 2 night. Reports he soaked the bed with sweat the past 2 nights but he has no temperature. States he is very weak. Pt wants to know what he should/can do. Please advise as DOD.

## 2018-11-08 NOTE — Telephone Encounter (Signed)
DOD  I have reviewed Dr. Blanch Media note.  Sounds more like infectious diarrhea Plan: - stool for GI pathogens, WBCs. - Check CBC with diff, CMP, CRP. - Stop taking any over-the-counter medications for now including vitamins. - If he is having any upper respiratory problems, should get tested for flu. - Can use Imodium on as-needed basis for now.  But use it sparingly, max 2-3/day. - Has OV with Dr. Henrene Pastor. - Call us (can call me) on Monday if not better.  Hopefully, stool studies and the blood tests will be back by then.  Thx Merrie Roof

## 2018-11-08 NOTE — Telephone Encounter (Signed)
Spoke with pt and he is aware. Orders in epic.

## 2018-11-14 ENCOUNTER — Telehealth: Payer: Self-pay | Admitting: Internal Medicine

## 2018-11-14 ENCOUNTER — Other Ambulatory Visit (INDEPENDENT_AMBULATORY_CARE_PROVIDER_SITE_OTHER): Payer: Medicare Other

## 2018-11-14 DIAGNOSIS — R197 Diarrhea, unspecified: Secondary | ICD-10-CM

## 2018-11-14 LAB — HIGH SENSITIVITY CRP: CRP, High Sensitivity: 0.54 mg/L (ref 0.000–5.000)

## 2018-11-14 LAB — CBC WITH DIFFERENTIAL/PLATELET
Basophils Absolute: 0.1 10*3/uL (ref 0.0–0.1)
Basophils Relative: 0.6 % (ref 0.0–3.0)
EOS ABS: 0.2 10*3/uL (ref 0.0–0.7)
Eosinophils Relative: 1.6 % (ref 0.0–5.0)
HCT: 40.5 % (ref 39.0–52.0)
Hemoglobin: 13.6 g/dL (ref 13.0–17.0)
Lymphocytes Relative: 22.6 % (ref 12.0–46.0)
Lymphs Abs: 2.4 10*3/uL (ref 0.7–4.0)
MCHC: 33.5 g/dL (ref 30.0–36.0)
MCV: 91.3 fl (ref 78.0–100.0)
Monocytes Absolute: 1 10*3/uL (ref 0.1–1.0)
Monocytes Relative: 9.3 % (ref 3.0–12.0)
NEUTROS PCT: 65.9 % (ref 43.0–77.0)
Neutro Abs: 7.1 10*3/uL (ref 1.4–7.7)
Platelets: 260 10*3/uL (ref 150.0–400.0)
RBC: 4.44 Mil/uL (ref 4.22–5.81)
RDW: 13.7 % (ref 11.5–15.5)
WBC: 10.8 10*3/uL — ABNORMAL HIGH (ref 4.0–10.5)

## 2018-11-14 LAB — COMPREHENSIVE METABOLIC PANEL
ALBUMIN: 4 g/dL (ref 3.5–5.2)
ALK PHOS: 65 U/L (ref 39–117)
ALT: 18 U/L (ref 0–53)
AST: 20 U/L (ref 0–37)
BUN: 19 mg/dL (ref 6–23)
CALCIUM: 8.7 mg/dL (ref 8.4–10.5)
CO2: 29 mEq/L (ref 19–32)
Chloride: 106 mEq/L (ref 96–112)
Creatinine, Ser: 0.94 mg/dL (ref 0.40–1.50)
GFR: 77.63 mL/min (ref >60.00)
Glucose, Bld: 91 mg/dL (ref 70–99)
Potassium: 3.4 mEq/L — ABNORMAL LOW (ref 3.5–5.1)
Sodium: 142 mEq/L (ref 135–145)
Total Bilirubin: 0.6 mg/dL (ref 0.2–1.2)
Total Protein: 6.4 g/dL (ref 6.0–8.3)

## 2018-11-14 NOTE — Telephone Encounter (Signed)
Pt called back and states he wants to schedule an appt with an APP next week. Pt scheduled to see Don Savoy NP 11/19/18@2pm , pt aware of appt. He knows to come prior to appt for labs that were ordered last week.

## 2018-11-14 NOTE — Telephone Encounter (Signed)
Pt reports he is still having issues going to the bathroom as far as bowel movements. He states he is still having pain in the rectal area and is not making his water like he usually does. Pt also states he is having rectal bleeding again. Pt did not come for the labs that were ordered last week. Pt knows we do not handle any urinary issues.Discussed with pt that we could see him next week with an APP. Pt states he will try to get in with his PCP.

## 2018-11-14 NOTE — Telephone Encounter (Signed)
Pt called In wanting to be seen today by dr. Henrene Pastor or early Friday morning did advised that he had not avali. Appts. Pt wanted a msg sent up to the nurse because he is still having the same issues from appt 2weeks ago and was advised by the doctor to call if he still has problems

## 2018-11-14 NOTE — Addendum Note (Signed)
Addended by: Trenda Moots on: 03/16/8831 03:50 PM   Modules accepted: Orders

## 2018-11-15 ENCOUNTER — Other Ambulatory Visit: Payer: Medicare Other

## 2018-11-15 DIAGNOSIS — R197 Diarrhea, unspecified: Secondary | ICD-10-CM | POA: Diagnosis not present

## 2018-11-18 ENCOUNTER — Other Ambulatory Visit: Payer: Self-pay

## 2018-11-18 DIAGNOSIS — K219 Gastro-esophageal reflux disease without esophagitis: Secondary | ICD-10-CM

## 2018-11-18 DIAGNOSIS — E876 Hypokalemia: Secondary | ICD-10-CM

## 2018-11-18 DIAGNOSIS — R197 Diarrhea, unspecified: Secondary | ICD-10-CM

## 2018-11-18 MED ORDER — POTASSIUM CHLORIDE CRYS ER 20 MEQ PO TBCR: 20.0000 meq | EXTENDED_RELEASE_TABLET | Freq: Every day | ORAL | 0 refills | Status: DC

## 2018-11-19 ENCOUNTER — Encounter: Payer: Self-pay | Admitting: Nurse Practitioner

## 2018-11-19 ENCOUNTER — Ambulatory Visit (INDEPENDENT_AMBULATORY_CARE_PROVIDER_SITE_OTHER): Payer: Medicare Other | Admitting: Nurse Practitioner

## 2018-11-19 VITALS — BP 142/80 | HR 68 | Ht 65.5 in | Wt 145.0 lb

## 2018-11-19 DIAGNOSIS — R197 Diarrhea, unspecified: Secondary | ICD-10-CM

## 2018-11-19 DIAGNOSIS — K625 Hemorrhage of anus and rectum: Secondary | ICD-10-CM | POA: Diagnosis not present

## 2018-11-19 LAB — GASTROINTESTINAL PATHOGEN PANEL PCR
C. difficile Tox A/B, PCR: UNDETERMINED — AB
CAMPYLOBACTER, PCR: UNDETERMINED — AB
Cryptosporidium, PCR: UNDETERMINED — AB
E coli (ETEC) LT/ST PCR: UNDETERMINED — AB
E coli (STEC) stx1/stx2, PCR: UNDETERMINED — AB
E coli 0157, PCR: UNDETERMINED — AB
Giardia lamblia, PCR: UNDETERMINED — AB
Norovirus, PCR: UNDETERMINED — AB
Rotavirus A, PCR: UNDETERMINED — AB
Salmonella, PCR: UNDETERMINED — AB
Shigella, PCR: UNDETERMINED — AB

## 2018-11-19 NOTE — Patient Instructions (Signed)
If you are age 78 or older, your body mass index should be between 23-30. Your Body mass index is 23.76 kg/m. If this is out of the aforementioned range listed, please consider follow up with your Primary Care Provider.  If you are age 71 or younger, your body mass index should be between 19-25. Your Body mass index is 23.76 kg/m. If this is out of the aformentioned range listed, please consider follow up with your Primary Care Provider.   Dr. Blanch Media nurse will call you with stool study results.  Follow up as needed.  Thank you for choosing me and [facility].   Tye Savoy, NP

## 2018-11-19 NOTE — Progress Notes (Signed)
Chief Complaint:   Aloe up on diarrhea with rectal bleeding/rectal discomfort  IMPRESSION and PLAN:     92.  78 year old male here for follow-up of diarrhea / rectal bleeding and rectal pressure.  Diarrhea has resolved but bowel movements not yet back to baseline.  No rectal bleeding in 3 days.  Rectal discomfort has resolved.  -Diarrhea for which patient saw Dr. Henrene Pastor on 11/04/18 was presumed infectious.  For ongoing diarrhea we ordered stool studies which he submitted on 11/15/2018.  Results are not yet available but diarrhea has since resolved.  -Overall bleeding sounds like it was minor.  Hemoglobin with minor decline from baseline of 15 to 13.6. -Rectal discomfort / pressure and bleeding could have been secondary to a a small anal fissure.  Internal hemorrhoids also a possibility though those are not usually painful.  -Patient gives a remote history of hemorrhoid surgery.  He have recurrent bleeding patient will call the office for further evaluation.  At that time we will likely look with an anoscope to confirm internal hemorrhoids. Internal hemorrhoid banding brochure given just in case bleeding does recur and hemorrhoids found on anoscopy -Patient will call for any recurrence of symptoms  2. History of adenomatous colon polyps.  He is up-to-date on surveillance colonoscopies, last one August 2018  3. Hypokalemia, mild. Potassium 3.4 on recent labs. Hypokalemia secondary to diarrhea which I discussed with the patient and wife.    Potassium repletion in progress   HPI:     Patient is a 78 year old male known to Dr. Henrene Pastor.  He has history of GERD with peptic stricture, adenomatous colon polyps and a strong family history of colon cancer. Patient was seen by Dr. Henrene Pastor 11/04/18 for evaluation of diarrhea felt to be of infectious origin.  He had concurrent intermittent moderate rectal bleeding which we thought was likely anorectal in nature.  Reassurance was provided, patient was asked  to call the office should symptoms persist.  Patient called the office again on 11/08/2018 with rectal discomfort and ongoing diarrhea.  Decision was routed to Doc of day, Dr. Lyndel Safe.  Stool studies and labs were ordered.  Patient called the office again on 11/14/2018 asking for an appointment for recurrent rectal bleeding and rectal discomfort, he was given an appointment to see me today.   Labs that we ordered drawn 11/14/2018 were basically unremarkable except for mild hypokalemia which we prescribed daily potassium x7 days.  White count minimally elevated at 10.8, hemoglobin normal at 13.6 though down slightly from baseline of around 15.  Stool studies are still pending.   Patient is here with his wife.  Rectal bleeding, rectal discomfort resolved 3 or so days ago.  No further diarrhea but bowel movements are not back to baseline.  We reviewed his lab results.    Review of systems:     No chest pain, no SOB, no fevers, no urinary sx   Past Medical History:  Diagnosis Date  . Bilateral recurrent inguinal hernia   . BPH with obstruction/lower urinary tract symptoms   . Cough 09/20/2016  . DDD (degenerative disc disease), cervical   . Diverticulosis of colon   . GERD (gastroesophageal reflux disease)   . Hiatal hernia   . History of adenomatous polyp of colon    2006 tubular adenoma;  2010 tubular adenoma and hyperplastic  . Hyperlipidemia   . OA (osteoarthritis)    hands  . Organic sexual dysfunction   . Pre-diabetes   .  S/P dilatation of esophageal stricture    multiple times --  last time w/ balloon 12-31-2015  . Wears dentures    upper denture, lower partial  . Wears glasses     Patient's surgical history, family medical history, social history, medications and allergies were all reviewed in Epic   Serum creatinine: 0.94 mg/dL 11/14/18 1553 Estimated creatinine clearance: 58.4 mL/min  Current Outpatient Medications  Medication Sig Dispense Refill  . aspirin EC 81 MG tablet Take  81 mg by mouth. Take 2 tablets daily    . atorvastatin (LIPITOR) 40 MG tablet Take 1 tablet (40 mg total) by mouth daily. 90 tablet 1  . Multiple Vitamins-Minerals (CENTRUM SILVER PO) Take by mouth daily.    Marland Kitchen omeprazole (PRILOSEC) 40 MG capsule Take 1 capsule (40 mg total) by mouth daily. 30 capsule 11  . potassium chloride SA (K-DUR,KLOR-CON) 20 MEQ tablet Take 1 tablet (20 mEq total) by mouth daily for 7 days. 7 tablet 0   No current facility-administered medications for this visit.     Physical Exam:     BP (!) 142/80   Pulse 68   Ht 5' 5.5" (1.664 m)   Wt 145 lb (65.8 kg)   BMI 23.76 kg/m   GENERAL:  Pleasant male in NAD PSYCH: : Cooperative, normal affect EENT:  conjunctiva pink, mucous membranes moist, neck supple without masses CARDIAC:  RRR,  no peripheral edema PULM: Normal respiratory effort, lungs CTA bilaterally, no wheezing ABDOMEN:  Nondistended, soft, nontender. No obvious masses, no hepatomegaly,  normal bowel sounds SKIN:  turgor, no lesions seen Musculoskeletal:  Normal muscle tone, normal strength NEURO: Alert and oriented x 3, no focal neurologic deficits  I spent 25 minutes of face-to-face time with the patient. Greater than 50% of the time was spent counseling and coordinating care. Questions answered  Tye Savoy , NP 11/19/2018, 2:30 PM

## 2018-11-21 LAB — SPECIMEN STATUS REPORT

## 2018-11-25 NOTE — Progress Notes (Signed)
Assessment and plan reviewed 

## 2018-11-26 LAB — FECAL LEUKOCYTES

## 2019-03-18 ENCOUNTER — Other Ambulatory Visit: Payer: Self-pay | Admitting: Internal Medicine

## 2019-03-18 DIAGNOSIS — E785 Hyperlipidemia, unspecified: Secondary | ICD-10-CM

## 2019-03-18 MED ORDER — ATORVASTATIN CALCIUM 40 MG PO TABS: 40.0000 mg | ORAL_TABLET | Freq: Every day | ORAL | 1 refills | Status: DC

## 2019-06-16 ENCOUNTER — Other Ambulatory Visit: Payer: Self-pay | Admitting: Internal Medicine

## 2019-06-16 DIAGNOSIS — E785 Hyperlipidemia, unspecified: Secondary | ICD-10-CM

## 2019-06-20 ENCOUNTER — Ambulatory Visit (INDEPENDENT_AMBULATORY_CARE_PROVIDER_SITE_OTHER): Payer: Medicare Other

## 2019-06-20 ENCOUNTER — Other Ambulatory Visit: Payer: Self-pay

## 2019-06-20 DIAGNOSIS — Z23 Encounter for immunization: Secondary | ICD-10-CM

## 2019-08-06 ENCOUNTER — Other Ambulatory Visit: Payer: Self-pay

## 2019-09-02 ENCOUNTER — Encounter: Payer: Self-pay | Admitting: Internal Medicine

## 2019-09-02 ENCOUNTER — Other Ambulatory Visit: Payer: Self-pay

## 2019-09-02 ENCOUNTER — Ambulatory Visit (INDEPENDENT_AMBULATORY_CARE_PROVIDER_SITE_OTHER): Payer: Medicare Other | Admitting: Internal Medicine

## 2019-09-02 ENCOUNTER — Ambulatory Visit (INDEPENDENT_AMBULATORY_CARE_PROVIDER_SITE_OTHER): Payer: Medicare Other

## 2019-09-02 VITALS — BP 148/86 | HR 61 | Temp 98.0°F | Resp 16 | Ht 65.5 in | Wt 144.0 lb

## 2019-09-02 DIAGNOSIS — M545 Low back pain, unspecified: Secondary | ICD-10-CM

## 2019-09-02 DIAGNOSIS — I10 Essential (primary) hypertension: Secondary | ICD-10-CM

## 2019-09-02 DIAGNOSIS — G8929 Other chronic pain: Secondary | ICD-10-CM

## 2019-09-02 DIAGNOSIS — M542 Cervicalgia: Secondary | ICD-10-CM | POA: Insufficient documentation

## 2019-09-02 DIAGNOSIS — E785 Hyperlipidemia, unspecified: Secondary | ICD-10-CM

## 2019-09-02 DIAGNOSIS — N401 Enlarged prostate with lower urinary tract symptoms: Secondary | ICD-10-CM

## 2019-09-02 DIAGNOSIS — R7303 Prediabetes: Secondary | ICD-10-CM

## 2019-09-02 DIAGNOSIS — R351 Nocturia: Secondary | ICD-10-CM

## 2019-09-02 DIAGNOSIS — Z0000 Encounter for general adult medical examination without abnormal findings: Secondary | ICD-10-CM

## 2019-09-02 DIAGNOSIS — M47816 Spondylosis without myelopathy or radiculopathy, lumbar region: Secondary | ICD-10-CM | POA: Diagnosis not present

## 2019-09-02 DIAGNOSIS — Z23 Encounter for immunization: Secondary | ICD-10-CM | POA: Insufficient documentation

## 2019-09-02 LAB — CBC WITH DIFFERENTIAL/PLATELET
Basophils Absolute: 0.1 10*3/uL (ref 0.0–0.1)
Basophils Relative: 1.2 % (ref 0.0–3.0)
Eosinophils Absolute: 0.3 10*3/uL (ref 0.0–0.7)
Eosinophils Relative: 4.8 % (ref 0.0–5.0)
HCT: 42.6 % (ref 39.0–52.0)
Hemoglobin: 14 g/dL (ref 13.0–17.0)
Lymphocytes Relative: 32 % (ref 12.0–46.0)
Lymphs Abs: 1.9 10*3/uL (ref 0.7–4.0)
MCHC: 32.9 g/dL (ref 30.0–36.0)
MCV: 93.3 fl (ref 78.0–100.0)
Monocytes Absolute: 0.7 10*3/uL (ref 0.1–1.0)
Monocytes Relative: 11.7 % (ref 3.0–12.0)
Neutro Abs: 2.9 10*3/uL (ref 1.4–7.7)
Neutrophils Relative %: 50.3 % (ref 43.0–77.0)
Platelets: 215 10*3/uL (ref 150.0–400.0)
RBC: 4.56 Mil/uL (ref 4.22–5.81)
RDW: 14.3 % (ref 11.5–15.5)
WBC: 5.8 10*3/uL (ref 4.0–10.5)

## 2019-09-02 LAB — LIPID PANEL
Cholesterol: 153 mg/dL (ref 0–200)
HDL: 56.7 mg/dL (ref >39.00)
LDL Cholesterol: 89 mg/dL (ref 0–99)
NonHDL: 96.73
Total CHOL/HDL Ratio: 3
Triglycerides: 41 mg/dL (ref 0.0–149.0)
VLDL: 8.2 mg/dL (ref 0.0–40.0)

## 2019-09-02 LAB — HEPATIC FUNCTION PANEL
ALT: 25 U/L (ref 0–53)
AST: 26 U/L (ref 0–37)
Albumin: 4.3 g/dL (ref 3.5–5.2)
Alkaline Phosphatase: 73 U/L (ref 39–117)
Bilirubin, Direct: 0.1 mg/dL (ref 0.0–0.3)
Total Bilirubin: 0.6 mg/dL (ref 0.2–1.2)
Total Protein: 6.3 g/dL (ref 6.0–8.3)

## 2019-09-02 LAB — BASIC METABOLIC PANEL
BUN: 21 mg/dL (ref 6–23)
CO2: 29 mEq/L (ref 19–32)
Calcium: 8.7 mg/dL (ref 8.4–10.5)
Chloride: 106 mEq/L (ref 96–112)
Creatinine, Ser: 0.91 mg/dL (ref 0.40–1.50)
GFR: 80.43 mL/min (ref >60.00)
Glucose, Bld: 98 mg/dL (ref 70–99)
Potassium: 4.1 mEq/L (ref 3.5–5.1)
Sodium: 142 mEq/L (ref 135–145)

## 2019-09-02 LAB — URINALYSIS, ROUTINE W REFLEX MICROSCOPIC
Bilirubin Urine: NEGATIVE
Ketones, ur: NEGATIVE
Leukocytes,Ua: NEGATIVE
Nitrite: NEGATIVE
Specific Gravity, Urine: 1.025 (ref 1.000–1.030)
Total Protein, Urine: NEGATIVE
Urine Glucose: NEGATIVE
Urobilinogen, UA: 0.2 (ref 0.0–1.0)
pH: 5.5 (ref 5.0–8.0)

## 2019-09-02 LAB — TSH: TSH: 3 u[IU]/mL (ref 0.35–4.50)

## 2019-09-02 LAB — PSA: PSA: 0.57 ng/mL (ref 0.10–4.00)

## 2019-09-02 LAB — HEMOGLOBIN A1C: Hgb A1c MFr Bld: 6 % (ref 4.6–6.5)

## 2019-09-02 MED ORDER — ZOSTER VAC RECOMB ADJUVANTED 50 MCG/0.5ML IM SUSR: 0.5000 mL | Freq: Once | INTRAMUSCULAR | 1 refills | Status: AC

## 2019-09-02 NOTE — Patient Instructions (Signed)

## 2019-09-02 NOTE — Progress Notes (Signed)
Subjective:  Patient ID: Don Johnson, male    DOB: 04-29-1941  Age: 78 y.o. MRN: [id number]  CC: Annual Exam, Hypertension, Hyperlipidemia, and Back Pain  This visit occurred during the SARS-CoV-2 public health emergency.  Safety protocols were in place, including screening questions prior to the visit, additional usage of staff PPE, and extensive cleaning of exam room while observing appropriate contact time as indicated for disinfecting solutions.   HPI Don Johnson presents for a CPX.  He complains of chronic, intermittent, unchanged, nonradiating pain in his neck and low back. He is getting adequate symptom relief with Tylenol.  He denies paresthesias in his extremities.  He is very active and denies any recent episodes of CP, DOE, palpitations, edema, or fatigue.  He was aware of the abdominal hernia but he says it does not bother him.  Outpatient Medications Prior to Visit  Medication Sig Dispense Refill  . aspirin EC 81 MG tablet Take 81 mg by mouth. Take 2 tablets daily    . atorvastatin (LIPITOR) 40 MG tablet Take 1 tablet (40 mg total) by mouth daily. 90 tablet 1  . Multiple Vitamins-Minerals (CENTRUM SILVER PO) Take by mouth daily.    Marland Kitchen omeprazole (PRILOSEC) 40 MG capsule Take 1 capsule (40 mg total) by mouth daily. 30 capsule 11  . potassium chloride SA (K-DUR,KLOR-CON) 20 MEQ tablet Take 1 tablet (20 mEq total) by mouth daily for 7 days. 7 tablet 0   No facility-administered medications prior to visit.    ROS Review of Systems  Constitutional: Negative.  Negative for diaphoresis and fatigue.  HENT: Negative.  Negative for trouble swallowing.   Eyes: Negative for visual disturbance.  Respiratory: Negative for cough, chest tightness, shortness of breath and wheezing.   Cardiovascular: Negative for chest pain, palpitations and leg swelling.  Gastrointestinal: Negative for abdominal pain, constipation, diarrhea, nausea and vomiting.  Endocrine: Negative.     Genitourinary: Negative for difficulty urinating, scrotal swelling, testicular pain and urgency.  Musculoskeletal: Positive for back pain and neck pain. Negative for arthralgias.  Skin: Negative.  Negative for color change and rash.  Neurological: Negative.  Negative for dizziness, weakness, light-headedness and numbness.  Hematological: Negative for adenopathy. Does not bruise/bleed easily.  Psychiatric/Behavioral: Negative.     Objective:  BP (!) 148/86 (BP Location: Left Arm, Patient Position: Sitting, Cuff Size: Normal)   Pulse 61   Temp 98 F (36.7 C) (Oral)   Resp 16   Ht 5' 5.5" (1.664 m)   Wt 144 lb (65.3 kg)   SpO2 97%   BMI 23.60 kg/m   BP Readings from Last 3 Encounters:  09/02/19 (!) 148/86  11/19/18 (!) 142/80  11/04/18 126/64    Wt Readings from Last 3 Encounters:  09/02/19 144 lb (65.3 kg)  11/19/18 145 lb (65.8 kg)  11/04/18 143 lb (64.9 kg)    Physical Exam Vitals reviewed.  Constitutional:      Appearance: Normal appearance.  HENT:     Nose: Nose normal.     Mouth/Throat:     Mouth: Mucous membranes are moist.  Eyes:     General: No scleral icterus.    Conjunctiva/sclera: Conjunctivae normal.  Cardiovascular:     Rate and Rhythm: Normal rate and regular rhythm.     Heart sounds: No murmur.  Pulmonary:     Effort: Pulmonary effort is normal.     Breath sounds: No stridor. No wheezing, rhonchi or rales.  Abdominal:     General: Abdomen  is flat. Bowel sounds are normal. There is no distension.     Palpations: Abdomen is soft. There is no hepatomegaly, splenomegaly or mass.     Tenderness: There is no abdominal tenderness. There is no guarding.     Hernia: A hernia is present. Hernia is present in the ventral area. There is no hernia in the umbilical area.  Genitourinary:    Pubic Area: No rash.      Penis: Normal and circumcised.      Testes: Normal.        Right: Mass or tenderness not present.        Left: Mass or tenderness not present.      Epididymis:     Right: Normal. Not inflamed or enlarged.     Left: Normal. Not inflamed or enlarged.     Prostate: Enlarged (1+ smooth symm BPH). Not tender and no nodules present.     Rectum: Normal. Guaiac result negative. No mass, tenderness, anal fissure, external hemorrhoid or internal hemorrhoid. Normal anal tone.     Comments: ++ prosthesis Musculoskeletal:        General: Normal range of motion.     Cervical back: Neck supple.     Right lower leg: No edema.     Left lower leg: No edema.  Lymphadenopathy:     Cervical: No cervical adenopathy.  Skin:    General: Skin is warm and dry.  Neurological:     General: No focal deficit present.     Mental Status: He is alert.  Psychiatric:        Mood and Affect: Mood normal.        Behavior: Behavior normal.     Lab Results  Component Value Date   WBC 10.8 (H) 11/14/2018   HGB 13.6 11/14/2018   HCT 40.5 11/14/2018   PLT 260.0 11/14/2018   GLUCOSE 91 11/14/2018   CHOL 167 08/28/2018   TRIG 75.0 08/28/2018   HDL 54.90 08/28/2018   LDLDIRECT 149.1 06/30/2013   LDLCALC 97 08/28/2018   ALT 18 11/14/2018   AST 20 11/14/2018   NA 142 11/14/2018   K 3.4 (L) 11/14/2018   CL 106 11/14/2018   CREATININE 0.94 11/14/2018   BUN 19 11/14/2018   CO2 29 11/14/2018   TSH 2.74 08/28/2018   PSA 0.58 08/28/2018   HGBA1C 6.2 08/28/2018    DG Lumbar Spine Complete  Result Date: 09/30/2018 CLINICAL DATA:  78 year old male with intermittent lower right back and right hip pain for the past year. No recent injury. Initial encounter. EXAM: LUMBAR SPINE - COMPLETE 4+ VIEW COMPARISON:  None. FINDINGS: Minimal curvature lumbar spine. No acute compression fracture. Minimal anterior wedging T12 may be congenital. No pars defect noted. L1-2 minimal disc space narrowing with osteophyte greater to right. L2-3 bilateral lateral osteophyte. L3-4 mild to moderate disc space narrowing greater on left with left lateral osteophyte. L4-5 moderate to  marked disc space narrowing. Minimal retrolisthesis L4. Mild facet degenerative changes. L5-S1 mild to moderate disc space narrowing greatest posteriorly. Minimal facet degenerative changes. Moderate stool. IMPRESSION: Multilevel degenerative changes as detailed above most notable L4-5 level. Electronically Signed   By: Genia Del M.D.   On: 09/30/2018 15:24   [place] LIMITED JOINT SPACE STRUCTURES LOW RIGHT  Result Date: 10/06/2018 No images saved   DG HIP UNILAT WITH PELVIS 2-3 VIEWS RIGHT  Result Date: 09/30/2018 CLINICAL DATA:  78 year old male with intermittent lower right back and right hip pain for the past  year. No recent injury. Initial encounter. EXAM: DG HIP (WITH OR WITHOUT PELVIS) 2-3V RIGHT COMPARISON:  Lumbar spine films same date dictated separately. FINDINGS: No significant hip joint space narrowing or surrounding degenerative changes. No fracture or dislocation. No plain film evidence of right femoral head avascular necrosis. Sacroiliac joints appear intact. Penile prosthesis noted. Degenerative changes lumbar spine as dictated on lumbar spine report. IMPRESSION: Negative plain film examination of the right hip. Electronically Signed   By: Genia Del M.D.   On: 09/30/2018 15:27   DG Cervical Spine Complete  Result Date: 09/02/2019 CLINICAL DATA:  Chronic neck pain.  No known injury. EXAM: CERVICAL SPINE - COMPLETE 4+ VIEW COMPARISON:  07/29/2015. FINDINGS: Diffuse multilevel degenerative change. Degenerative changes particularly prominent about C5-C6 and C6-C7. 3 mm anterolisthesis C4 on C5 and C7 on T1. No acute bony abnormality. Carotid vascular calcification. IMPRESSION: 1. Diffuse multilevel degenerative change. Degenerative changes particularly prominent about C5-C6 and C6-C7. 3 mm anterolisthesis C4 on C5 and C7 on T1. No acute bony abnormality. 2.  Carotid vascular disease. Electronically Signed   By: Marcello Moores  Register   On: 09/02/2019 14:48   DG Lumbar Spine  Complete  Result Date: 09/02/2019 CLINICAL DATA:  Chronic pain EXAM: LUMBAR SPINE - COMPLETE 4+ VIEW COMPARISON:  09/30/2018 FINDINGS: Vertebral body heights and alignment are similar. There is multilevel degenerative disc disease with loss of disc height and small endplate osteophytes. Facet hypertrophy is also present. Appearance is similar to the prior study. IMPRESSION: Multilevel degenerative changes similar in appearance to prior study. Electronically Signed   By: Macy Mis M.D.   On: 09/02/2019 14:47     Assessment & Plan:   Don Johnson was seen today for annual exam, hypertension, hyperlipidemia and back pain.  Diagnoses and all orders for this visit:  Essential hypertension - His blood pressure is adequately well controlled.  Electrolytes and renal function are normal. -     CBC with Differential -     Basic metabolic panel -     TSH -     Urinalysis, Routine w reflex microscopic  Hyperlipidemia with target LDL less than 130- He has achieved his LDL goal is doing well on the statin. -     Lipid panel -     TSH -     Hepatic function panel  BPH associated with nocturia- His PSA is low which is reassuring that he does not have prostate cancer.  He has no symptoms that need to be treated. -     Urinalysis, Routine w reflex microscopic -     PSA  Prediabetes-his A1c is at 6.2%.  Medical therapy is not indicated. -     Basic metabolic panel -     Hemoglobin A1c  Routine general medical examination at a health care facility- Exam completed, labs reviewed, vaccines reviewed and updated, no cancer screenings are indicated, patient education was given.  Need for shingles vaccine -     Zoster Vaccine Adjuvanted [facility]) injection; Inject 0.5 mLs into the muscle once for 1 dose.  Neck pain, bilateral posterior- Based on his symptoms, exam and plain films he has stable degenerative disc disease and is getting adequate symptom relief with Tylenol. -     DG Cervical Spine  Complete; Future  Chronic bilateral low back pain without sciatica- Based on his symptoms, exam and plain films he has stable degenerative disc disease and is getting adequate symptom relief with Tylenol -     DG Lumbar Spine  Complete; Future   I am having Don Johnson. Don Johnson start on Zoster Vaccine Adjuvanted. I am also having him maintain his aspirin EC, Multiple Vitamins-Minerals (CENTRUM SILVER PO), omeprazole, potassium chloride SA, and atorvastatin.  Meds ordered this encounter  Medications  . Zoster Vaccine Adjuvanted [facility]) injection    Sig: Inject 0.5 mLs into the muscle once for 1 dose.    Dispense:  0.5 mL    Refill:  1     Follow-up: No follow-ups on file.  Scarlette Calico, MD

## 2019-09-04 ENCOUNTER — Telehealth: Payer: Self-pay

## 2019-09-04 NOTE — Telephone Encounter (Signed)
Pt returned call for info from visit 09/02/19.

## 2019-09-25 DIAGNOSIS — Z23 Encounter for immunization: Secondary | ICD-10-CM | POA: Diagnosis not present

## 2019-10-24 DIAGNOSIS — Z23 Encounter for immunization: Secondary | ICD-10-CM | POA: Diagnosis not present

## 2019-11-06 ENCOUNTER — Other Ambulatory Visit: Payer: Self-pay | Admitting: Internal Medicine

## 2020-02-04 ENCOUNTER — Other Ambulatory Visit: Payer: Self-pay | Admitting: Internal Medicine

## 2020-02-23 ENCOUNTER — Telehealth: Payer: Self-pay

## 2020-02-23 NOTE — Telephone Encounter (Signed)
New message   The wife calling asking for something to help during a red-eye flight to [place].    [facility] [place], [address]

## 2020-02-23 NOTE — Telephone Encounter (Signed)
Last OV with PCP was December 2020

## 2020-02-24 ENCOUNTER — Other Ambulatory Visit: Payer: Self-pay | Admitting: Internal Medicine

## 2020-02-24 DIAGNOSIS — G4725 Circadian rhythm sleep disorder, jet lag type: Secondary | ICD-10-CM | POA: Insufficient documentation

## 2020-02-24 MED ORDER — ZOLPIDEM TARTRATE 5 MG PO TABS: 5.0000 mg | ORAL_TABLET | Freq: Every evening | ORAL | 0 refills | Status: DC | PRN

## 2020-02-24 NOTE — Progress Notes (Unsigned)
Jet lag

## 2020-03-21 ENCOUNTER — Other Ambulatory Visit: Payer: Self-pay | Admitting: Internal Medicine

## 2020-03-21 DIAGNOSIS — E785 Hyperlipidemia, unspecified: Secondary | ICD-10-CM

## 2020-05-03 ENCOUNTER — Other Ambulatory Visit: Payer: Self-pay | Admitting: Internal Medicine

## 2020-05-03 NOTE — Telephone Encounter (Signed)
Patient needs office visit.  

## 2020-07-06 DIAGNOSIS — Z23 Encounter for immunization: Secondary | ICD-10-CM | POA: Diagnosis not present

## 2020-07-17 DIAGNOSIS — Z23 Encounter for immunization: Secondary | ICD-10-CM | POA: Diagnosis not present

## 2020-07-30 DIAGNOSIS — H40013 Open angle with borderline findings, low risk, bilateral: Secondary | ICD-10-CM | POA: Diagnosis not present

## 2020-07-31 ENCOUNTER — Other Ambulatory Visit: Payer: Self-pay | Admitting: Internal Medicine

## 2020-09-16 ENCOUNTER — Other Ambulatory Visit: Payer: Self-pay | Admitting: Internal Medicine

## 2020-09-22 DIAGNOSIS — M25552 Pain in left hip: Secondary | ICD-10-CM | POA: Diagnosis not present

## 2020-09-22 DIAGNOSIS — M1612 Unilateral primary osteoarthritis, left hip: Secondary | ICD-10-CM | POA: Diagnosis not present

## 2020-09-27 ENCOUNTER — Ambulatory Visit: Payer: Medicare Other | Admitting: Internal Medicine

## 2020-10-06 ENCOUNTER — Other Ambulatory Visit: Payer: Self-pay

## 2020-10-06 ENCOUNTER — Ambulatory Visit (INDEPENDENT_AMBULATORY_CARE_PROVIDER_SITE_OTHER): Payer: Medicare Other | Admitting: Internal Medicine

## 2020-10-06 ENCOUNTER — Encounter: Payer: Self-pay | Admitting: Internal Medicine

## 2020-10-06 VITALS — BP 138/84 | HR 64 | Temp 97.9°F | Resp 16 | Ht 65.5 in | Wt 132.0 lb

## 2020-10-06 DIAGNOSIS — I10 Essential (primary) hypertension: Secondary | ICD-10-CM

## 2020-10-06 DIAGNOSIS — E785 Hyperlipidemia, unspecified: Secondary | ICD-10-CM

## 2020-10-06 DIAGNOSIS — R7303 Prediabetes: Secondary | ICD-10-CM | POA: Diagnosis not present

## 2020-10-06 LAB — URINALYSIS, ROUTINE W REFLEX MICROSCOPIC
Bilirubin Urine: NEGATIVE
Hgb urine dipstick: NEGATIVE
Ketones, ur: NEGATIVE
Leukocytes,Ua: NEGATIVE
Nitrite: NEGATIVE
RBC / HPF: NONE SEEN (ref 0–?)
Specific Gravity, Urine: >=1.03 — AB (ref 1.000–1.030)
Total Protein, Urine: NEGATIVE
Urine Glucose: NEGATIVE
Urobilinogen, UA: 0.2 (ref 0.0–1.0)
pH: 5.5 (ref 5.0–8.0)

## 2020-10-06 LAB — HEPATIC FUNCTION PANEL
ALT: 18 U/L (ref 0–53)
AST: 23 U/L (ref 0–37)
Albumin: 4.3 g/dL (ref 3.5–5.2)
Alkaline Phosphatase: 95 U/L (ref 39–117)
Bilirubin, Direct: 0.2 mg/dL (ref 0.0–0.3)
Total Bilirubin: 0.7 mg/dL (ref 0.2–1.2)
Total Protein: 7.1 g/dL (ref 6.0–8.3)

## 2020-10-06 LAB — LIPID PANEL
Cholesterol: 138 mg/dL (ref 0–200)
HDL: 54.5 mg/dL (ref >39.00)
LDL Cholesterol: 74 mg/dL (ref 0–99)
NonHDL: 83.27
Total CHOL/HDL Ratio: 3
Triglycerides: 44 mg/dL (ref 0.0–149.0)
VLDL: 8.8 mg/dL (ref 0.0–40.0)

## 2020-10-06 LAB — CBC WITH DIFFERENTIAL/PLATELET
Basophils Absolute: 0.1 10*3/uL (ref 0.0–0.1)
Basophils Relative: 1 % (ref 0.0–3.0)
Eosinophils Absolute: 0.3 10*3/uL (ref 0.0–0.7)
Eosinophils Relative: 4.3 % (ref 0.0–5.0)
HCT: 43.2 % (ref 39.0–52.0)
Hemoglobin: 14.6 g/dL (ref 13.0–17.0)
Lymphocytes Relative: 34 % (ref 12.0–46.0)
Lymphs Abs: 2.2 10*3/uL (ref 0.7–4.0)
MCHC: 33.8 g/dL (ref 30.0–36.0)
MCV: 90.8 fl (ref 78.0–100.0)
Monocytes Absolute: 0.8 10*3/uL (ref 0.1–1.0)
Monocytes Relative: 11.7 % (ref 3.0–12.0)
Neutro Abs: 3.2 10*3/uL (ref 1.4–7.7)
Neutrophils Relative %: 49 % (ref 43.0–77.0)
Platelets: 230 10*3/uL (ref 150.0–400.0)
RBC: 4.76 Mil/uL (ref 4.22–5.81)
RDW: 13.3 % (ref 11.5–15.5)
WBC: 6.4 10*3/uL (ref 4.0–10.5)

## 2020-10-06 LAB — BASIC METABOLIC PANEL
BUN: 28 mg/dL — ABNORMAL HIGH (ref 6–23)
CO2: 32 mEq/L (ref 19–32)
Calcium: 9.4 mg/dL (ref 8.4–10.5)
Chloride: 102 mEq/L (ref 96–112)
Creatinine, Ser: 1.04 mg/dL (ref 0.40–1.50)
GFR: 68.14 mL/min (ref >60.00)
Glucose, Bld: 94 mg/dL (ref 70–99)
Potassium: 4.3 mEq/L (ref 3.5–5.1)
Sodium: 139 mEq/L (ref 135–145)

## 2020-10-06 LAB — TSH: TSH: 2.72 u[IU]/mL (ref 0.35–4.50)

## 2020-10-06 LAB — HEMOGLOBIN A1C: Hgb A1c MFr Bld: 6.3 % (ref 4.6–6.5)

## 2020-10-06 NOTE — Progress Notes (Signed)
Subjective:  Patient ID: Don Johnson, male    DOB: 1940-11-25  Age: 80 y.o. MRN: [id number]  CC: Hypertension, Hyperlipidemia, and Gastroesophageal Reflux  This visit occurred during the SARS-CoV-2 public health emergency.  Safety protocols were in place, including screening questions prior to the visit, additional usage of staff PPE, and extensive cleaning of exam room while observing appropriate contact time as indicated for disinfecting solutions.    HPI Don Johnson presents for f/up - He has intentionally lost weight with lifestyle modifications.  He tells me he is being evaluated elsewhere for chronic left hip pain.  He is active and denies any recent episodes of chest pain, shortness of breath, palpitations, edema, or fatigue.  Outpatient Medications Prior to Visit  Medication Sig Dispense Refill  . Multiple Vitamins-Minerals (CENTRUM SILVER PO) Take by mouth daily.    Marland Kitchen omeprazole (PRILOSEC) 40 MG capsule TAKE ONE CAPSULE BY MOUTH DAILY 90 capsule 1  . aspirin EC 81 MG tablet Take 81 mg by mouth. Take 2 tablets daily    . atorvastatin (LIPITOR) 40 MG tablet Take 1 tablet (40 mg total) by mouth daily. 90 tablet 1  . zolpidem (AMBIEN) 5 MG tablet Take 1 tablet (5 mg total) by mouth at bedtime as needed for sleep. 10 tablet 0  . potassium chloride SA (K-DUR,KLOR-CON) 20 MEQ tablet Take 1 tablet (20 mEq total) by mouth daily for 7 days. 7 tablet 0   No facility-administered medications prior to visit.    ROS Review of Systems  Constitutional: Negative.  Negative for appetite change, diaphoresis, fatigue and unexpected weight change.  HENT: Negative.   Eyes: Negative for visual disturbance.  Respiratory: Negative for cough, chest tightness, shortness of breath and wheezing.   Cardiovascular: Negative for chest pain, palpitations and leg swelling.  Gastrointestinal: Negative for abdominal pain, blood in stool, constipation, diarrhea, nausea and vomiting.  Endocrine: Negative.    Genitourinary: Negative.  Negative for difficulty urinating, dysuria and hematuria.  Musculoskeletal: Positive for arthralgias. Negative for myalgias and neck pain.  Skin: Negative.  Negative for color change.  Neurological: Negative.  Negative for dizziness, weakness, light-headedness and headaches.  Hematological: Negative for adenopathy. Does not bruise/bleed easily.  Psychiatric/Behavioral: Negative.     Objective:  BP 138/84   Pulse 64   Temp 97.9 F (36.6 C) (Oral)   Resp 16   Ht 5' 5.5" (1.664 m)   Wt 132 lb (59.9 kg)   SpO2 98%   BMI 21.63 kg/m   BP Readings from Last 3 Encounters:  10/06/20 138/84  09/02/19 (!) 148/86  11/19/18 (!) 142/80    Wt Readings from Last 3 Encounters:  10/06/20 132 lb (59.9 kg)  09/02/19 144 lb (65.3 kg)  11/19/18 145 lb (65.8 kg)    Physical Exam Vitals reviewed.  Constitutional:      Appearance: Normal appearance.  HENT:     Nose: Nose normal.     Mouth/Throat:     Mouth: Mucous membranes are moist.  Eyes:     General: No scleral icterus.    Conjunctiva/sclera: Conjunctivae normal.  Cardiovascular:     Rate and Rhythm: Normal rate and regular rhythm.     Heart sounds: No murmur heard.   Pulmonary:     Effort: Pulmonary effort is normal.     Breath sounds: No stridor. No wheezing, rhonchi or rales.  Abdominal:     General: Abdomen is flat. Bowel sounds are normal. There is no distension.  Palpations: Abdomen is soft. There is no hepatomegaly, splenomegaly or mass.     Tenderness: There is no abdominal tenderness.     Hernia: There is no hernia in the left inguinal area or right inguinal area.  Genitourinary:    Penis: Normal.      Testes: Normal.        Right: Mass not present.        Left: Mass not present.     Epididymis:     Right: Normal.     Left: Normal.  Musculoskeletal:        General: Normal range of motion.     Cervical back: Neck supple.     Right hip: Normal.     Left hip: Normal. No deformity,  tenderness or bony tenderness. Normal range of motion.     Right lower leg: No edema.     Left lower leg: No edema.  Lymphadenopathy:     Cervical: No cervical adenopathy.     Lower Body: No right inguinal adenopathy. No left inguinal adenopathy.  Skin:    General: Skin is warm and dry.     Coloration: Skin is not pale.  Neurological:     General: No focal deficit present.     Mental Status: He is alert.  Psychiatric:        Mood and Affect: Mood normal.        Behavior: Behavior normal.     Lab Results  Component Value Date   WBC 6.4 10/06/2020   HGB 14.6 10/06/2020   HCT 43.2 10/06/2020   PLT 230.0 10/06/2020   GLUCOSE 94 10/06/2020   CHOL 138 10/06/2020   TRIG 44.0 10/06/2020   HDL 54.50 10/06/2020   LDLDIRECT 149.1 06/30/2013   LDLCALC 74 10/06/2020   ALT 18 10/06/2020   AST 23 10/06/2020   NA 139 10/06/2020   K 4.3 10/06/2020   CL 102 10/06/2020   CREATININE 1.04 10/06/2020   BUN 28 (H) 10/06/2020   CO2 32 10/06/2020   TSH 2.72 10/06/2020   PSA 0.57 09/02/2019   HGBA1C 6.3 10/06/2020    DG Lumbar Spine Complete  Result Date: 09/30/2018 CLINICAL DATA:  80 year old male with intermittent lower right back and right hip pain for the past year. No recent injury. Initial encounter. EXAM: LUMBAR SPINE - COMPLETE 4+ VIEW COMPARISON:  None. FINDINGS: Minimal curvature lumbar spine. No acute compression fracture. Minimal anterior wedging T12 may be congenital. No pars defect noted. L1-2 minimal disc space narrowing with osteophyte greater to right. L2-3 bilateral lateral osteophyte. L3-4 mild to moderate disc space narrowing greater on left with left lateral osteophyte. L4-5 moderate to marked disc space narrowing. Minimal retrolisthesis L4. Mild facet degenerative changes. L5-S1 mild to moderate disc space narrowing greatest posteriorly. Minimal facet degenerative changes. Moderate stool. IMPRESSION: Multilevel degenerative changes as detailed above most notable L4-5 level.  Electronically Signed   By: Genia Del M.D.   On: 09/30/2018 15:24   [place] LIMITED JOINT SPACE STRUCTURES LOW RIGHT  Result Date: 10/06/2018 No images saved   DG HIP UNILAT WITH PELVIS 2-3 VIEWS RIGHT  Result Date: 09/30/2018 CLINICAL DATA:  80 year old male with intermittent lower right back and right hip pain for the past year. No recent injury. Initial encounter. EXAM: DG HIP (WITH OR WITHOUT PELVIS) 2-3V RIGHT COMPARISON:  Lumbar spine films same date dictated separately. FINDINGS: No significant hip joint space narrowing or surrounding degenerative changes. No fracture or dislocation. No plain film evidence of right  femoral head avascular necrosis. Sacroiliac joints appear intact. Penile prosthesis noted. Degenerative changes lumbar spine as dictated on lumbar spine report. IMPRESSION: Negative plain film examination of the right hip. Electronically Signed   By: Genia Del M.D.   On: 09/30/2018 15:27    Assessment & Plan:   Kesaun was seen today for hypertension, hyperlipidemia and gastroesophageal reflux.  Diagnoses and all orders for this visit:  Essential hypertension- His blood pressure is adequately well controlled. -     CBC with Differential/Platelet; Future -     Basic metabolic panel; Future -     Urinalysis, Routine w reflex microscopic; Future -     Urinalysis, Routine w reflex microscopic -     Basic metabolic panel -     CBC with Differential/Platelet  Hyperlipidemia with target LDL less than 130- He has achieved his LDL goal is doing well on the statin. -     Lipid panel; Future -     TSH; Future -     Hepatic function panel; Future -     Hepatic function panel -     TSH -     Lipid panel  Prediabetes- His A1c is at 6.3%.  Medical therapy is not indicated. -     Basic metabolic panel; Future -     Hemoglobin A1c; Future -     Hemoglobin A1c -     Basic metabolic panel   I have discontinued [name]. Admire's aspirin EC, potassium chloride SA, and  zolpidem. I am also having him maintain his Multiple Vitamins-Minerals (CENTRUM SILVER PO) and omeprazole.  No orders of the defined types were placed in this encounter.    Follow-up: Return in about 6 months (around 04/05/2021).  Scarlette Calico, MD

## 2020-10-06 NOTE — Patient Instructions (Signed)
[employer] Current Therapy 2021 (pp. 213-216). [place], [place]: Elsevier.">  Gastroesophageal Reflux Disease, Adult Gastroesophageal reflux (GER) happens when acid from the stomach flows up into the tube that connects the mouth and the stomach (esophagus). Normally, food travels down the esophagus and stays in the stomach to be digested. However, when a person has GER, food and stomach acid sometimes move back up into the esophagus. If this becomes a more serious problem, the person may be diagnosed with a disease called gastroesophageal reflux disease (GERD). GERD occurs when the reflux:  Happens often.  Causes frequent or severe symptoms.  Causes problems such as damage to the esophagus. When stomach acid comes in contact with the esophagus, the acid may cause inflammation in the esophagus. Over time, GERD may create small holes (ulcers) in the lining of the esophagus. What are the causes? This condition is caused by a problem with the muscle between the esophagus and the stomach (lower esophageal sphincter, or LES). Normally, the LES muscle closes after food passes through the esophagus to the stomach. When the LES is weakened or abnormal, it does not close properly, and that allows food and stomach acid to go back up into the esophagus. The LES can be weakened by certain dietary substances, medicines, and medical conditions, including:  Tobacco use.  Pregnancy.  Having a hiatal hernia.  Alcohol use.  Certain foods and beverages, such as coffee, chocolate, onions, and peppermint. What increases the risk? You are more likely to develop this condition if you:  Have an increased body weight.  Have a connective tissue disorder.  Take NSAIDs, such as ibuprofen. What are the signs or symptoms? Symptoms of this condition include:  Heartburn.  Difficult or painful swallowing and the feeling of having a lump in the throat.  A bitter taste in the mouth.  Bad breath and having a large  amount of saliva.  Having an upset or bloated stomach and belching.  Chest pain. Different conditions can cause chest pain. Make sure you see your health care provider if you experience chest pain.  Shortness of breath or wheezing.  Ongoing (chronic) cough or a nighttime cough.  Wearing away of tooth enamel.  Weight loss. How is this diagnosed? This condition may be diagnosed based on a medical history and a physical exam. To determine if you have mild or severe GERD, your health care provider may also monitor how you respond to treatment. You may also have tests, including:  A test to examine your stomach and esophagus with a small camera (endoscopy).  A test that measures the acidity level in your esophagus.  A test that measures how much pressure is on your esophagus.  A barium swallow or modified barium swallow test to show the shape, size, and functioning of your esophagus. How is this treated? Treatment for this condition may vary depending on how severe your symptoms are. Your health care provider may recommend:  Changes to your diet.  Medicine.  Surgery. The goal of treatment is to help relieve your symptoms and to prevent complications. Follow these instructions at home: Eating and drinking  Follow a diet as recommended by your health care provider. This may involve avoiding foods and drinks such as: ? Coffee and tea, with or without caffeine. ? Drinks that contain alcohol. ? Energy drinks and sports drinks. ? Carbonated drinks or sodas. ? Chocolate and cocoa. ? Peppermint and mint flavorings. ? Garlic and onions. ? Horseradish. ? Spicy and acidic foods, including peppers, chili powder,   curry powder, vinegar, hot sauces, and barbecue sauce. ? Citrus fruit juices and citrus fruits, such as oranges, lemons, and limes. ? Tomato-based foods, such as red sauce, chili, salsa, and pizza with red sauce. ? Fried and fatty foods, such as donuts, french fries, potato  chips, and high-fat dressings. ? High-fat meats, such as hot dogs and fatty cuts of red and white meats, such as rib eye steak, sausage, ham, and bacon. ? High-fat dairy items, such as whole milk, butter, and cream cheese.  Eat small, frequent meals instead of large meals.  Avoid drinking large amounts of liquid with your meals.  Avoid eating meals during the 2-3 hours before bedtime.  Avoid lying down right after you eat.  Do not exercise right after you eat.   Lifestyle  Do not use any products that contain nicotine or tobacco. These products include cigarettes, chewing tobacco, and vaping devices, such as e-cigarettes. If you need help quitting, ask your health care provider.  Try to reduce your stress by using methods such as yoga or meditation. If you need help reducing stress, ask your health care provider.  If you are overweight, reduce your weight to an amount that is healthy for you. Ask your health care provider for guidance about a safe weight loss goal.   General instructions  Pay attention to any changes in your symptoms.  Take over-the-counter and prescription medicines only as told by your health care provider. Do not take aspirin, ibuprofen, or other NSAIDs unless your health care provider told you to take these medicines.  Wear loose-fitting clothing. Do not wear anything tight around your waist that causes pressure on your abdomen.  Raise (elevate) the head of your bed about 6 inches (15 cm). You can use a wedge to do this.  Avoid bending over if this makes your symptoms worse.  Keep all follow-up visits. This is important. Contact a health care provider if:  You have: ? New symptoms. ? Unexplained weight loss. ? Difficulty swallowing or it hurts to swallow. ? Wheezing or a persistent cough. ? A hoarse voice.  Your symptoms do not improve with treatment. Get help right away if:  You have sudden pain in your arms, neck, jaw, teeth, or back.  You  suddenly feel sweaty, dizzy, or light-headed.  You have chest pain or shortness of breath.  You vomit and the vomit is green, yellow, or black, or it looks like blood or coffee grounds.  You faint.  You have stool that is red, bloody, or black.  You cannot swallow, drink, or eat. These symptoms may represent a serious problem that is an emergency. Do not wait to see if the symptoms will go away. Get medical help right away. Call your local emergency services (911 in the [place] Do not drive yourself to the hospital. Summary  Gastroesophageal reflux happens when acid from the stomach flows up into the esophagus. GERD is a disease in which the reflux happens often, causes frequent or severe symptoms, or causes problems such as damage to the esophagus.  Treatment for this condition may vary depending on how severe your symptoms are. Your health care provider may recommend diet and lifestyle changes, medicine, or surgery.  Contact a health care provider if you have new or worsening symptoms.  Take over-the-counter and prescription medicines only as told by your health care provider. Do not take aspirin, ibuprofen, or other NSAIDs unless your health care provider told you to do so.  Keep all follow-up   visits as told by your health care provider. This is important. This information is not intended to replace advice given to you by your health care provider. Make sure you discuss any questions you have with your health care provider. Document Revised: 03/08/2020 Document Reviewed: 03/08/2020 Elsevier Patient Education  2021 [employer]vier Inc.  

## 2020-10-08 ENCOUNTER — Encounter: Payer: Self-pay | Admitting: Internal Medicine

## 2020-10-09 ENCOUNTER — Other Ambulatory Visit: Payer: Self-pay | Admitting: Internal Medicine

## 2020-10-09 DIAGNOSIS — E785 Hyperlipidemia, unspecified: Secondary | ICD-10-CM

## 2020-10-12 ENCOUNTER — Ambulatory Visit: Payer: Medicare Other

## 2020-10-13 DIAGNOSIS — M25552 Pain in left hip: Secondary | ICD-10-CM | POA: Diagnosis not present

## 2020-11-03 ENCOUNTER — Encounter: Payer: Self-pay | Admitting: Physical Therapy

## 2020-11-03 ENCOUNTER — Other Ambulatory Visit: Payer: Self-pay

## 2020-11-03 ENCOUNTER — Ambulatory Visit: Payer: Medicare Other | Attending: Physical Medicine and Rehabilitation | Admitting: Physical Therapy

## 2020-11-03 DIAGNOSIS — M25652 Stiffness of left hip, not elsewhere classified: Secondary | ICD-10-CM | POA: Diagnosis not present

## 2020-11-03 DIAGNOSIS — M25552 Pain in left hip: Secondary | ICD-10-CM | POA: Insufficient documentation

## 2020-11-03 NOTE — Therapy (Signed)
[facility] [place], [place], [address] Phone: [phone]   Fax:  [phone]  Physical Therapy Evaluation  Patient Details  Name: Don Johnson MRN: [id number] Date of Birth: 22-Aug-1941 Referring Provider (PT): Suella Broad MD   Encounter Date: 11/03/2020   PT End of Session - 11/03/20 1142    Visit Number 1    Number of Visits 12    Date for PT Re-Evaluation 12/01/20    Authorization Type FOTO AT LEAST EVERY 5TH VISIT.  PROGRESS NOTE AT 10TH VISIT.  KX MODIFIER AFTER 15 VISITS.    PT Start Time 0815    PT Stop Time 0849    PT Time Calculation (min) 34 min           Past Medical History:  Diagnosis Date  . Bilateral recurrent inguinal hernia   . BPH with obstruction/lower urinary tract symptoms   . Cough 09/20/2016  . DDD (degenerative disc disease), cervical   . Diverticulosis of colon   . GERD (gastroesophageal reflux disease)   . Hiatal hernia   . History of adenomatous polyp of colon    2006 tubular adenoma;  2010 tubular adenoma and hyperplastic  . Hyperlipidemia   . OA (osteoarthritis)    hands  . Organic sexual dysfunction   . Pre-diabetes   . S/P dilatation of esophageal stricture    multiple times --  last time w/ balloon 12-31-2015  . Wears dentures    upper denture, lower partial  . Wears glasses     Past Surgical History:  Procedure Laterality Date  . COLONOSCOPY  last one 03-14-2011  . ESOPHAGOGASTRODUODENOSCOPY (EGD) WITH ESOPHAGEAL DILATION  last one 12-31-2015  . HEMORROIDECTOMY  1970's  . INGUINAL HERNIA REPAIR Bilateral 1980's  . PENILE PROSTHESIS IMPLANT N/A 09/29/2016   Procedure: PENILE PROTHESIS INFLATABLE;  Surgeon: Carolan Clines, MD;  Location: [facility];  Service: Urology;  Laterality: N/A;  . UMBILICAL HERNIA REPAIR  2007 approx    There were no vitals filed for this visit.    Subjective Assessment - 11/03/20 1135    Subjective COVID-19 screen  performed prior to patient entering clinic.  The patient presents to the clinic today with c/o left hip pain that has been ongoing for about 5 months.  His pain can become quite severe when walking for longer periods of time.  His pain is rated at a 6/10 today.  Sitting decreases his pain.  An injection was helpful for a short time.    Pertinent History Hernia, DDD, OA.    How long can you walk comfortably? Have to sit when out shopping with wife.    Patient Stated Goals Get out of pain and walk without pain.    Currently in Pain? Yes    Pain Score 6     Pain Location --   Left groin.   Pain Orientation Left    Pain Descriptors / Indicators Aching;Sharp    Pain Type Acute pain    Pain Onset More than a month ago    Pain Frequency Constant    Aggravating Factors  See above.    Pain Relieving Factors See above.              [facility] PT Assessment - 11/03/20 0001      Assessment   Medical Diagnosis Pain in left hip.    Referring Provider (PT) Suella Broad MD    Onset Date/Surgical Date --   ~5 months.  Precautions   Precautions None      Restrictions   Weight Bearing Restrictions No      Balance Screen   Has the patient fallen in the past 6 months No    Has the patient had a decrease in activity level because of a fear of falling?  No    Is the patient reluctant to leave their home because of a fear of falling?  No      Prior Function   Level of Independence Independent      Observation/Other Assessments   Focus on Therapeutic Outcomes (FOTO)  Complete.      Posture/Postural Control   Posture/Postural Control No significant limitations      ROM / Strength   AROM / PROM / Strength AROM;Strength      AROM   Overall AROM Comments Left hip flexion 100 degrees and IR decreased by 50%.  Right hip WNL.      Strength   Overall Strength Comments Normal left hip strength.      Palpation   Palpation comment Minimal left lateral hip tenderness.  Referred pain to left groin  region.      Special Tests   Other special tests (+) left Scour Test.      Ambulation/Gait   Gait Comments Mild gait antalgia noted.                      Objective measurements completed on examination: See above findings.       [facility] Adult PT Treatment/Exercise - 11/03/20 0001      Exercises   Exercises Knee/Hip      Knee/Hip Exercises: Aerobic   Nustep Level 3 x 8 minutes.                       PT Long Term Goals - 11/03/20 1212      PT LONG TERM GOAL #1   Title Independent with a HEP.    Time 4    Period Weeks    Status New      PT LONG TERM GOAL #2   Title Restore normal left hip range of motion.    Time 4    Period Weeks    Status New      PT LONG TERM GOAL #3   Title Walk with wife while shopping without having to stop due to left hip pain.    Time 4    Period Weeks    Status New      PT LONG TERM GOAL #4   Title Perform ADL's with pain not > 2-3/10.    Time 4    Period Weeks    Status New                  Plan - 11/03/20 1206    Clinical Impression Statement The patient presents to OPPT with c/o left hip that that he states has been worsening over approximately the last 5 months.  He has limitations of left hip range of motion and referred pain into his left groin region.  He is not able to walk as long as previous due to pain.  His left hip strength is essentially normal.  Patient will benefit from skilled physical therapy intervention to address pain and deficits.    Personal Factors and Comorbidities Comorbidity 1;Comorbidity 2;Other    Comorbidities Hernia, DDD, OA.    Examination-Activity Limitations Locomotion Level;Other    Stability/Clinical Decision  Making Evolving/Moderate complexity    Clinical Decision Making Low    Rehab Potential Good    PT Frequency 2x / week    PT Duration 6 weeks    PT Treatment/Interventions ADLs/Self Care Home Management;Cryotherapy;Electrical Stimulation;Ultrasound;Moist  Heat;Iontophoresis 4mg /ml Dexamethasone;Functional mobility training;Therapeutic activities;Therapeutic exercise;Manual techniques;Patient/family education;Passive range of motion;Dry needling;Joint Manipulations    PT Next Visit Plan Left SKTC stretch, hip stretch(s) to increase IR, Nustep and progression to bike.  Modalties and/or STW/M as needed.    Consulted and Agree with Plan of Care Patient           Patient will benefit from skilled therapeutic intervention in order to improve the following deficits and impairments:  Decreased activity tolerance  Visit Diagnosis: Pain in left hip - Plan: PT plan of care cert/re-cert  Stiffness of left hip, not elsewhere classified - Plan: PT plan of care cert/re-cert     Problem List Patient Active Problem List   Diagnosis Date Noted  . Jet lag syndrome 02/24/2020  . Chronic bilateral low back pain without sciatica 09/02/2019  . Piriformis syndrome, right 09/30/2018  . Essential hypertension 08/28/2018  . DDD (degenerative disc disease), cervical 07/29/2015  . Prediabetes 06/30/2013  . Erectile dysfunction 06/30/2013  . Routine general medical examination at a health care facility 04/12/2011  . ESOPHAGEAL STRICTURE 12/14/2008  . Gastritis and gastroduodenitis 12/14/2008  . BPH associated with nocturia 12/03/2008  . Osteoarthrosis, hand 12/03/2008  . Hyperlipidemia with target LDL less than 130 09/28/2008    Gerold Sar, [place] MPT 11/03/2020, 12:19 PM  [facility] [address] [place], [place], [address] Phone: [phone]   Fax:  [phone]  Name: CEASER EBELING MRN: [id number] Date of Birth: Jan 29, 1941

## 2020-11-05 ENCOUNTER — Encounter: Payer: Self-pay | Admitting: Physical Therapy

## 2020-11-05 ENCOUNTER — Other Ambulatory Visit: Payer: Self-pay

## 2020-11-05 ENCOUNTER — Ambulatory Visit: Payer: Medicare Other | Admitting: Physical Therapy

## 2020-11-05 DIAGNOSIS — M25552 Pain in left hip: Secondary | ICD-10-CM

## 2020-11-05 DIAGNOSIS — M25652 Stiffness of left hip, not elsewhere classified: Secondary | ICD-10-CM | POA: Diagnosis not present

## 2020-11-05 NOTE — Therapy (Signed)
[facility] [place], [place], [address] Phone: [phone]   Fax:  [phone]  Physical Therapy Treatment  Patient Details  Name: Don Johnson MRN: [id number] Date of Birth: 02/14/1941 Referring Provider (PT): Suella Broad MD   Encounter Date: 11/05/2020   PT End of Session - 11/05/20 0823    Visit Number 2    Number of Visits 12    Date for PT Re-Evaluation 12/01/20    Authorization Type FOTO AT LEAST EVERY 5TH VISIT.  PROGRESS NOTE AT 10TH VISIT.  KX MODIFIER AFTER 15 VISITS.    PT Start Time 0732    PT Stop Time 0813    PT Time Calculation (min) 41 min    Activity Tolerance Patient tolerated treatment well    Behavior During Therapy WFL for tasks assessed/performed           Past Medical History:  Diagnosis Date  . Bilateral recurrent inguinal hernia   . BPH with obstruction/lower urinary tract symptoms   . Cough 09/20/2016  . DDD (degenerative disc disease), cervical   . Diverticulosis of colon   . GERD (gastroesophageal reflux disease)   . Hiatal hernia   . History of adenomatous polyp of colon    2006 tubular adenoma;  2010 tubular adenoma and hyperplastic  . Hyperlipidemia   . OA (osteoarthritis)    hands  . Organic sexual dysfunction   . Pre-diabetes   . S/P dilatation of esophageal stricture    multiple times --  last time w/ balloon 12-31-2015  . Wears dentures    upper denture, lower partial  . Wears glasses     Past Surgical History:  Procedure Laterality Date  . COLONOSCOPY  last one 03-14-2011  . ESOPHAGOGASTRODUODENOSCOPY (EGD) WITH ESOPHAGEAL DILATION  last one 12-31-2015  . HEMORROIDECTOMY  1970's  . INGUINAL HERNIA REPAIR Bilateral 1980's  . PENILE PROSTHESIS IMPLANT N/A 09/29/2016   Procedure: PENILE PROTHESIS INFLATABLE;  Surgeon: Carolan Clines, MD;  Location: [facility];  Service: Urology;  Laterality: N/A;  . UMBILICAL HERNIA REPAIR  2007 approx     There were no vitals filed for this visit.   Subjective Assessment - 11/05/20 0819    Subjective COVID-19 screen performed prior to patient entering clinic. Patient presented in clinic with reports of soreness of L hip.    Pertinent History Hernia, DDD, OA.    How long can you walk comfortably? Have to sit when out shopping with wife.    Patient Stated Goals Get out of pain and walk without pain.    Currently in Pain? Yes    Pain Score --   No score provided   Pain Location Hip    Pain Orientation Left    Pain Descriptors / Indicators Sore    Pain Type Acute pain    Pain Onset More than a month ago    Pain Frequency Constant              [facility] PT Assessment - 11/05/20 0001      Assessment   Medical Diagnosis Pain in left hip.    Referring Provider (PT) Suella Broad MD    Next MD Visit TBD      Precautions   Precautions None      Restrictions   Weight Bearing Restrictions No                         [facility] Adult PT Treatment/Exercise -  11/05/20 0001      Knee/Hip Exercises: Stretches   Other Knee/Hip Stretches LLE SKTC 3x30 sec      Knee/Hip Exercises: Aerobic   Nustep L3 x17 min      Knee/Hip Exercises: Standing   Heel Raises Both;20 reps    Heel Raises Limitations B toe raise x20 reps    Hip Flexion AROM;Left;2 sets;10 reps    Hip Abduction AROM;Left;2 sets;10 reps    Hip Extension AROM;Left;2 sets;10 reps    Forward Step Up Left;20 reps;Hand Hold: 2;Step Height: 6"      Knee/Hip Exercises: Seated   Long Arc Quad Strengthening;Left;2 sets;10 reps;Weights    Long Arc Quad Weight 4 lbs.    Clamshell with TheraBand Red   x20 reps                      PT Long Term Goals - 11/03/20 1212      PT LONG TERM GOAL #1   Title Independent with a HEP.    Time 4    Period Weeks    Status New      PT LONG TERM GOAL #2   Title Restore normal left hip range of motion.    Time 4    Period Weeks    Status New      PT LONG TERM  GOAL #3   Title Walk with wife while shopping without having to stop due to left hip pain.    Time 4    Period Weeks    Status New      PT LONG TERM GOAL #4   Title Perform ADL's with pain not > 2-3/10.    Time 4    Period Weeks    Status New                 Plan - 11/05/20 3007    Clinical Impression Statement Patient presented in clinic with reports of L hip soreness. Patient progressed through light ROM and strengthening with some limitation as patient encouraged to only complete within comfortable range. Patient reports limited with prolonged walking due to pain in L hip. Deficient passive L hip flexion noted with SKTC. Patient reported continued L hip soreness following end of treatment.    Personal Factors and Comorbidities Comorbidity 1;Comorbidity 2;Other    Comorbidities Hernia, DDD, OA.    Examination-Activity Limitations Locomotion Level;Other    Stability/Clinical Decision Making Evolving/Moderate complexity    Rehab Potential Good    PT Frequency 2x / week    PT Duration 6 weeks    PT Treatment/Interventions ADLs/Self Care Home Management;Cryotherapy;Electrical Stimulation;Ultrasound;Moist Heat;Iontophoresis 4mg /ml Dexamethasone;Functional mobility training;Therapeutic activities;Therapeutic exercise;Manual techniques;Patient/family education;Passive range of motion;Dry needling;Joint Manipulations    PT Next Visit Plan Left SKTC stretch, hip stretch(s) to increase IR, Nustep and progression to bike.  Modalties and/or STW/M as needed.    Consulted and Agree with Plan of Care Patient           Patient will benefit from skilled therapeutic intervention in order to improve the following deficits and impairments:  Decreased activity tolerance  Visit Diagnosis: Pain in left hip  Stiffness of left hip, not elsewhere classified     Problem List Patient Active Problem List   Diagnosis Date Noted  . Jet lag syndrome 02/24/2020  . Chronic bilateral low back pain  without sciatica 09/02/2019  . Piriformis syndrome, right 09/30/2018  . Essential hypertension 08/28/2018  . DDD (degenerative disc disease), cervical 07/29/2015  .  Prediabetes 06/30/2013  . Erectile dysfunction 06/30/2013  . Routine general medical examination at a health care facility 04/12/2011  . ESOPHAGEAL STRICTURE 12/14/2008  . Gastritis and gastroduodenitis 12/14/2008  . BPH associated with nocturia 12/03/2008  . Osteoarthrosis, hand 12/03/2008  . Hyperlipidemia with target LDL less than 130 09/28/2008    Standley Brooking, PTA 11/05/2020, 8:26 AM  [facility] [address] [place], [place], [address] Phone: [phone]   Fax:  [phone]  Name: Don Johnson MRN: [id number] Date of Birth: 12-21-1940

## 2020-11-08 ENCOUNTER — Other Ambulatory Visit: Payer: Self-pay | Admitting: Internal Medicine

## 2020-11-09 ENCOUNTER — Ambulatory Visit: Payer: Medicare Other | Attending: Physical Medicine and Rehabilitation | Admitting: *Deleted

## 2020-11-09 ENCOUNTER — Other Ambulatory Visit: Payer: Self-pay

## 2020-11-09 DIAGNOSIS — M25652 Stiffness of left hip, not elsewhere classified: Secondary | ICD-10-CM | POA: Insufficient documentation

## 2020-11-09 DIAGNOSIS — M25552 Pain in left hip: Secondary | ICD-10-CM | POA: Insufficient documentation

## 2020-11-09 NOTE — Therapy (Signed)
[facility] [place], [place], [address] Phone: [phone]   Fax:  [phone]  Physical Therapy Treatment  Patient Details  Name: Don Johnson MRN: [id number] Date of Birth: Apr 07, 1941 Referring Provider (PT): Suella Broad MD   Encounter Date: 11/09/2020   PT End of Session - 11/09/20 0823    Visit Number 3    Number of Visits 12    Date for PT Re-Evaluation 12/01/20    Authorization Type FOTO AT LEAST EVERY 5TH VISIT.  PROGRESS NOTE AT 10TH VISIT.  KX MODIFIER AFTER 15 VISITS.    PT Start Time 0815    PT Stop Time 0003    PT Time Calculation (min) 948 min           Past Medical History:  Diagnosis Date  . Bilateral recurrent inguinal hernia   . BPH with obstruction/lower urinary tract symptoms   . Cough 09/20/2016  . DDD (degenerative disc disease), cervical   . Diverticulosis of colon   . GERD (gastroesophageal reflux disease)   . Hiatal hernia   . History of adenomatous polyp of colon    2006 tubular adenoma;  2010 tubular adenoma and hyperplastic  . Hyperlipidemia   . OA (osteoarthritis)    hands  . Organic sexual dysfunction   . Pre-diabetes   . S/P dilatation of esophageal stricture    multiple times --  last time w/ balloon 12-31-2015  . Wears dentures    upper denture, lower partial  . Wears glasses     Past Surgical History:  Procedure Laterality Date  . COLONOSCOPY  last one 03-14-2011  . ESOPHAGOGASTRODUODENOSCOPY (EGD) WITH ESOPHAGEAL DILATION  last one 12-31-2015  . HEMORROIDECTOMY  1970's  . INGUINAL HERNIA REPAIR Bilateral 1980's  . PENILE PROSTHESIS IMPLANT N/A 09/29/2016   Procedure: PENILE PROTHESIS INFLATABLE;  Surgeon: Carolan Clines, MD;  Location: [facility];  Service: Urology;  Laterality: N/A;  . UMBILICAL HERNIA REPAIR  2007 approx    There were no vitals filed for this visit.   Subjective Assessment - 11/09/20 0822    Subjective COVID-19 screen  performed prior to patient entering clinic. Patient presented in clinic with reports of soreness of L hip.  3/10    Pertinent History Hernia, DDD, OA.    How long can you walk comfortably? Have to sit when out shopping with wife.    Patient Stated Goals Get out of pain and walk without pain.    Currently in Pain? Yes    Pain Score 3     Pain Location Hip    Pain Orientation Left    Pain Descriptors / Indicators Sore    Pain Type Acute pain    Pain Onset More than a month ago                             [facility] Adult PT Treatment/Exercise - 11/09/20 0001      Exercises   Exercises Knee/Hip      Knee/Hip Exercises: Stretches   Piriformis Stretch Left;10 seconds   x10   Other Knee/Hip Stretches LLE SKTC x 10 for 10 secs    Other Knee/Hip Stretches IR stretch x 10 hold 10 secs      Knee/Hip Exercises: Aerobic   Nustep L3 x15 min      Knee/Hip Exercises: Standing   Heel Raises Both;20 reps;2 sets    Heel Raises Limitations B toe raise2  x20 reps    Hip Flexion AROM;Left;2 sets;10 reps    Hip Abduction AROM;Left;2 sets;10 reps      Knee/Hip Exercises: Sidelying   Clams 2x10    Other Sidelying Knee/Hip Exercises Reverse Clams 2x 10 ( most challenging)                       PT Long Term Goals - 11/03/20 1212      PT LONG TERM GOAL #1   Title Independent with a HEP.    Time 4    Period Weeks    Status New      PT LONG TERM GOAL #2   Title Restore normal left hip range of motion.    Time 4    Period Weeks    Status New      PT LONG TERM GOAL #3   Title Walk with wife while shopping without having to stop due to left hip pain.    Time 4    Period Weeks    Status New      PT LONG TERM GOAL #4   Title Perform ADL's with pain not > 2-3/10.    Time 4    Period Weeks    Status New                 Plan - 11/09/20 0630    Clinical Impression Statement Pt arrived today doing fairly well with 3/10  LT hip pain. Rx focused on standing  hip exs as well as supine and side lying. Pt advised to keep intensity low and stretches in a comfortable range at this time. HEP was reviewed and a handout was given. All therex was tolerated well today.    Personal Factors and Comorbidities Comorbidity 1;Comorbidity 2;Other    Comorbidities Hernia, DDD, OA.    Examination-Activity Limitations Locomotion Level;Other    Stability/Clinical Decision Making Evolving/Moderate complexity    Rehab Potential Good    PT Frequency 2x / week    PT Treatment/Interventions ADLs/Self Care Home Management;Cryotherapy;Electrical Stimulation;Ultrasound;Moist Heat;Iontophoresis 4mg /ml Dexamethasone;Functional mobility training;Therapeutic activities;Therapeutic exercise;Manual techniques;Patient/family education;Passive range of motion;Dry needling;Joint Manipulations    PT Next Visit Plan Left SKTC stretch, hip stretch(s) to increase IR, Nustep and progression to bike.  Modalties and/or STW/M as needed.    Consulted and Agree with Plan of Care Patient           Patient will benefit from skilled therapeutic intervention in order to improve the following deficits and impairments:  Decreased activity tolerance  Visit Diagnosis: Pain in left hip  Stiffness of left hip, not elsewhere classified     Problem List Patient Active Problem List   Diagnosis Date Noted  . Jet lag syndrome 02/24/2020  . Chronic bilateral low back pain without sciatica 09/02/2019  . Piriformis syndrome, right 09/30/2018  . Essential hypertension 08/28/2018  . DDD (degenerative disc disease), cervical 07/29/2015  . Prediabetes 06/30/2013  . Erectile dysfunction 06/30/2013  . Routine general medical examination at a health care facility 04/12/2011  . ESOPHAGEAL STRICTURE 12/14/2008  . Gastritis and gastroduodenitis 12/14/2008  . BPH associated with nocturia 12/03/2008  . Osteoarthrosis, hand 12/03/2008  . Hyperlipidemia with target LDL less than 130 09/28/2008     Kaylon Hitz,CHRIS, PTA 11/09/2020, 9:31 AM  [facility] [place], [place], [address] Phone: [phone]   Fax:  [phone]  Name: Don Johnson MRN: [id number] Date of Birth: July 07, 1941

## 2020-11-11 ENCOUNTER — Encounter: Payer: Self-pay | Admitting: Physical Therapy

## 2020-11-11 ENCOUNTER — Other Ambulatory Visit: Payer: Self-pay

## 2020-11-11 ENCOUNTER — Ambulatory Visit: Payer: Medicare Other | Admitting: Physical Therapy

## 2020-11-11 DIAGNOSIS — M25552 Pain in left hip: Secondary | ICD-10-CM

## 2020-11-11 DIAGNOSIS — M25652 Stiffness of left hip, not elsewhere classified: Secondary | ICD-10-CM

## 2020-11-11 NOTE — Therapy (Signed)
[facility] [place], [place], [address] Phone: [phone]   Fax:  [phone]  Physical Therapy Treatment  Patient Details  Name: Don Johnson MRN: [id number] Date of Birth: 1940-12-21 Referring Provider (PT): Suella Broad MD   Encounter Date: 11/11/2020   PT End of Session - 11/11/20 0850    Visit Number 4    Number of Visits 12    Date for PT Re-Evaluation 12/01/20    Authorization Type FOTO AT LEAST EVERY 5TH VISIT.  PROGRESS NOTE AT 10TH VISIT.  KX MODIFIER AFTER 15 VISITS.    PT Start Time 0730    PT Stop Time 0815    PT Time Calculation (min) 45 min    Activity Tolerance Patient tolerated treatment well    Behavior During Therapy WFL for tasks assessed/performed           Past Medical History:  Diagnosis Date  . Bilateral recurrent inguinal hernia   . BPH with obstruction/lower urinary tract symptoms   . Cough 09/20/2016  . DDD (degenerative disc disease), cervical   . Diverticulosis of colon   . GERD (gastroesophageal reflux disease)   . Hiatal hernia   . History of adenomatous polyp of colon    2006 tubular adenoma;  2010 tubular adenoma and hyperplastic  . Hyperlipidemia   . OA (osteoarthritis)    hands  . Organic sexual dysfunction   . Pre-diabetes   . S/P dilatation of esophageal stricture    multiple times --  last time w/ balloon 12-31-2015  . Wears dentures    upper denture, lower partial  . Wears glasses     Past Surgical History:  Procedure Laterality Date  . COLONOSCOPY  last one 03-14-2011  . ESOPHAGOGASTRODUODENOSCOPY (EGD) WITH ESOPHAGEAL DILATION  last one 12-31-2015  . HEMORROIDECTOMY  1970's  . INGUINAL HERNIA REPAIR Bilateral 1980's  . PENILE PROSTHESIS IMPLANT N/A 09/29/2016   Procedure: PENILE PROTHESIS INFLATABLE;  Surgeon: Carolan Clines, MD;  Location: [facility];  Service: Urology;  Laterality: N/A;  . UMBILICAL HERNIA REPAIR  2007 approx    There  were no vitals filed for this visit.   Subjective Assessment - 11/11/20 0747    Subjective COVID-19 screening performed upon arrival. Patient arrives feeling pretty good,  2/10 in left hip. Feels like exercises are helping.    Pertinent History Hernia, DDD, OA.    How long can you walk comfortably? Have to sit when out shopping with wife.    Patient Stated Goals Get out of pain and walk without pain.    Currently in Pain? Yes    Pain Score 2     Pain Location Hip    Pain Orientation Left    Pain Descriptors / Indicators Sore    Pain Type Acute pain    Pain Onset More than a month ago    Pain Frequency Constant              [facility] PT Assessment - 11/11/20 0001      Assessment   Medical Diagnosis Pain in left hip.    Referring Provider (PT) Suella Broad MD                         [facility] Adult PT Treatment/Exercise - 11/11/20 0001      Exercises   Exercises Knee/Hip      Knee/Hip Exercises: Aerobic   Nustep L3 x17 min   requested to  complete 5 laps     Knee/Hip Exercises: Standing   Heel Raises Both;20 reps;2 sets    Heel Raises Limitations B toe raise2 x20 reps    Knee Flexion AROM;Both;2 sets;10 reps    Hip Flexion AROM;2 sets;10 reps;Both    Other Standing Knee Exercises lateral stepping on balance beam x3      Knee/Hip Exercises: Seated   Long Arc Quad --    Long Arc [employer] --    Other Seated Knee/Hip Exercises resisted IR and ER 2x10 each    Sit to Sand 2 sets;10 reps;without UE support   1x10 with L focus     Knee/Hip Exercises: Sidelying   Clams 2x10    Other Sidelying Knee/Hip Exercises Reverse Clams 2x 10 ( most challenging)                       PT Long Term Goals - 11/03/20 1212      PT LONG TERM GOAL #1   Title Independent with a HEP.    Time 4    Period Weeks    Status New      PT LONG TERM GOAL #2   Title Restore normal left hip range of motion.    Time 4    Period Weeks    Status New      PT LONG TERM  GOAL #3   Title Walk with wife while shopping without having to stop due to left hip pain.    Time 4    Period Weeks    Status New      PT LONG TERM GOAL #4   Title Perform ADL's with pain not > 2-3/10.    Time 4    Period Weeks    Status New                 Plan - 11/11/20 4782    Clinical Impression Statement Patient arrives doing well with minimal pain at start of session. Patient was able to complete TEs with no reports of increased pain. Patient demonstrated good form with all TEs. Patient still with difficulties with sitting and driving but overall feels like there is improvements in strength.    Personal Factors and Comorbidities Comorbidity 1;Comorbidity 2;Other    Comorbidities Hernia, DDD, OA.    Examination-Activity Limitations Locomotion Level;Other    Stability/Clinical Decision Making Evolving/Moderate complexity    Clinical Decision Making Low    Rehab Potential Good    PT Frequency 2x / week    PT Duration 6 weeks    PT Treatment/Interventions ADLs/Self Care Home Management;Cryotherapy;Electrical Stimulation;Ultrasound;Moist Heat;Iontophoresis 4mg /ml Dexamethasone;Functional mobility training;Therapeutic activities;Therapeutic exercise;Manual techniques;Patient/family education;Passive range of motion;Dry needling;Joint Manipulations    PT Next Visit Plan Left SKTC stretch, hip stretch(s) to increase IR, Nustep and progression to bike.  Modalties and/or STW/M as needed.    Consulted and Agree with Plan of Care Patient           Patient will benefit from skilled therapeutic intervention in order to improve the following deficits and impairments:  Decreased activity tolerance  Visit Diagnosis: Pain in left hip  Stiffness of left hip, not elsewhere classified     Problem List Patient Active Problem List   Diagnosis Date Noted  . Jet lag syndrome 02/24/2020  . Chronic bilateral low back pain without sciatica 09/02/2019  . Piriformis syndrome, right  09/30/2018  . Essential hypertension 08/28/2018  . DDD (degenerative disc disease), cervical 07/29/2015  .  Prediabetes 06/30/2013  . Erectile dysfunction 06/30/2013  . Routine general medical examination at a health care facility 04/12/2011  . ESOPHAGEAL STRICTURE 12/14/2008  . Gastritis and gastroduodenitis 12/14/2008  . BPH associated with nocturia 12/03/2008  . Osteoarthrosis, hand 12/03/2008  . Hyperlipidemia with target LDL less than 130 09/28/2008    Gabriela Eves, PT, DPT 11/11/2020, 8:56 AM  [facility] [place], [place], [address] Phone: [phone]   Fax:  [phone]  Name: HAIK MAHONEY MRN: [id number] Date of Birth: [birth year]

## 2020-11-16 ENCOUNTER — Other Ambulatory Visit: Payer: Self-pay

## 2020-11-16 ENCOUNTER — Ambulatory Visit: Payer: Medicare Other | Admitting: Physical Therapy

## 2020-11-16 DIAGNOSIS — M25652 Stiffness of left hip, not elsewhere classified: Secondary | ICD-10-CM

## 2020-11-16 DIAGNOSIS — M25552 Pain in left hip: Secondary | ICD-10-CM | POA: Diagnosis not present

## 2020-11-16 NOTE — Therapy (Signed)
[facility] [place], [place], [address] Phone: [phone]   Fax:  [phone]  Physical Therapy Treatment  Patient Details  Name: Don Johnson MRN: [id number] Date of Birth: [birth year] Referring Provider (PT): Suella Broad MD   Encounter Date: 11/16/2020   PT End of Session - 11/16/20 0753    Visit Number 5    Number of Visits 12    Date for PT Re-Evaluation 12/01/20    Authorization Type FOTO AT LEAST EVERY 5TH VISIT.  PROGRESS NOTE AT 10TH VISIT.  KX MODIFIER AFTER 15 VISITS.    PT Start Time 0730    Activity Tolerance Patient tolerated treatment well    Behavior During Therapy [facility] for tasks assessed/performed           Past Medical History:  Diagnosis Date  . Bilateral recurrent inguinal hernia   . BPH with obstruction/lower urinary tract symptoms   . Cough 09/20/2016  . DDD (degenerative disc disease), cervical   . Diverticulosis of colon   . GERD (gastroesophageal reflux disease)   . Hiatal hernia   . History of adenomatous polyp of colon    2006 tubular adenoma;  2010 tubular adenoma and hyperplastic  . Hyperlipidemia   . OA (osteoarthritis)    hands  . Organic sexual dysfunction   . Pre-diabetes   . S/P dilatation of esophageal stricture    multiple times --  last time w/ balloon 12-31-2015  . Wears dentures    upper denture, lower partial  . Wears glasses     Past Surgical History:  Procedure Laterality Date  . COLONOSCOPY  last one 03-14-2011  . ESOPHAGOGASTRODUODENOSCOPY (EGD) WITH ESOPHAGEAL DILATION  last one 12-31-2015  . HEMORROIDECTOMY  1970's  . INGUINAL HERNIA REPAIR Bilateral 1980's  . PENILE PROSTHESIS IMPLANT N/A 09/29/2016   Procedure: PENILE PROTHESIS INFLATABLE;  Surgeon: Carolan Clines, MD;  Location: [facility];  Service: Urology;  Laterality: N/A;  . UMBILICAL HERNIA REPAIR  2007 approx    There were no vitals filed for this visit.   Subjective  Assessment - 11/16/20 0753    Subjective COVID-19 screening performed upon arrival. Patient arrives with ongoing 2/10 pain in left hip.    Pertinent History Hernia, DDD, OA.    How long can you walk comfortably? Have to sit when out shopping with wife.    Patient Stated Goals Get out of pain and walk without pain.    Currently in Pain? Yes    Pain Score 2     Pain Location Hip    Pain Orientation Left    Pain Descriptors / Indicators Sore    Pain Type Acute pain    Pain Onset More than a month ago    Pain Frequency Constant              [facility] PT Assessment - 11/16/20 0001      Assessment   Medical Diagnosis Pain in left hip.    Referring Provider (PT) Suella Broad MD                         [facility] Adult PT Treatment/Exercise - 11/16/20 0001      Exercises   Exercises Knee/Hip      Knee/Hip Exercises: Stretches   Piriformis Stretch Left;3 reps;30 seconds    Other Knee/Hip Stretches LLE SKTC 3x30 seconds      Knee/Hip Exercises: Aerobic   Nustep L3 x17 min  Knee/Hip Exercises: Standing   Heel Raises Both;20 reps;2 sets    Heel Raises Limitations B toe raise2 x20 reps    Wall Squat 2 sets;10 reps      Knee/Hip Exercises: Seated   Long Arc Quad Strengthening;Left;2 sets;10 reps;Weights    Long Arc Quad Weight 3 lbs.    Other Seated Knee/Hip Exercises resisted IR and ER red theraband  2x10 each      Knee/Hip Exercises: Sidelying   Hip ABduction AROM;Left;2 sets;10 reps    Clams 2x10    Other Sidelying Knee/Hip Exercises Reverse Clams 2x 10 ( most challenging)                       PT Long Term Goals - 11/03/20 1212      PT LONG TERM GOAL #1   Title Independent with a HEP.    Time 4    Period Weeks    Status New      PT LONG TERM GOAL #2   Title Restore normal left hip range of motion.    Time 4    Period Weeks    Status New      PT LONG TERM GOAL #3   Title Walk with wife while shopping without having to stop due to left  hip pain.    Time 4    Period Weeks    Status New      PT LONG TERM GOAL #4   Title Perform ADL's with pain not > 2-3/10.    Time 4    Period Weeks    Status New                 Plan - 11/16/20 0757    Clinical Impression Statement Patient responded well to therapy session with no reports of increased pain while performing TEs. Patient provided with tactile cuing for proper position and LE placement for left LE stretching. Patient able to attain position for stretches independently for remaining reps.    Personal Factors and Comorbidities Comorbidity 1;Comorbidity 2;Other    Comorbidities Hernia, DDD, OA.    Examination-Activity Limitations Locomotion Level;Other    Stability/Clinical Decision Making Evolving/Moderate complexity    Clinical Decision Making Low    Rehab Potential Good    PT Frequency 2x / week    PT Duration 6 weeks    PT Treatment/Interventions ADLs/Self Care Home Management;Cryotherapy;Electrical Stimulation;Ultrasound;Moist Heat;Iontophoresis 4mg /ml Dexamethasone;Functional mobility training;Therapeutic activities;Therapeutic exercise;Manual techniques;Patient/family education;Passive range of motion;Dry needling;Joint Manipulations    PT Next Visit Plan Assess goalsl and FOTO; Left SKTC stretch, hip stretch(s) to increase IR, Nustep and progression to bike.  Modalties and/or STW/M as needed.    Consulted and Agree with Plan of Care Patient           Patient will benefit from skilled therapeutic intervention in order to improve the following deficits and impairments:  Decreased activity tolerance  Visit Diagnosis: Pain in left hip  Stiffness of left hip, not elsewhere classified     Problem List Patient Active Problem List   Diagnosis Date Noted  . Jet lag syndrome 02/24/2020  . Chronic bilateral low back pain without sciatica 09/02/2019  . Piriformis syndrome, right 09/30/2018  . Essential hypertension 08/28/2018  . DDD (degenerative disc  disease), cervical 07/29/2015  . Prediabetes 06/30/2013  . Erectile dysfunction 06/30/2013  . Routine general medical examination at a health care facility 04/12/2011  . ESOPHAGEAL STRICTURE 12/14/2008  . Gastritis and gastroduodenitis 12/14/2008  .  BPH associated with nocturia 12/03/2008  . Osteoarthrosis, hand 12/03/2008  . Hyperlipidemia with target LDL less than 130 09/28/2008    Gabriela Eves, PT, DPT 11/16/2020, 8:18 AM  [facility] [address] [place], [place], [address] Phone: [phone]   Fax:  [phone]  Name: Don Johnson MRN: [id number] Date of Birth: March 02, 1941

## 2020-11-18 ENCOUNTER — Encounter: Payer: Medicare Other | Admitting: Physical Therapy

## 2020-11-19 ENCOUNTER — Encounter: Payer: Self-pay | Admitting: Physical Therapy

## 2020-11-19 ENCOUNTER — Other Ambulatory Visit: Payer: Self-pay

## 2020-11-19 ENCOUNTER — Ambulatory Visit: Payer: Medicare Other | Admitting: Physical Therapy

## 2020-11-19 DIAGNOSIS — M25552 Pain in left hip: Secondary | ICD-10-CM

## 2020-11-19 DIAGNOSIS — M25652 Stiffness of left hip, not elsewhere classified: Secondary | ICD-10-CM

## 2020-11-19 NOTE — Therapy (Signed)
[facility] [place], [place], [address] Phone: [phone]   Fax:  [phone]  Physical Therapy Treatment  Patient Details  Name: ADRIANN BALLWEG MRN: [id number] Date of Birth: [birth year] Referring Provider (PT): Suella Broad MD   Encounter Date: 11/19/2020   PT End of Session - 11/19/20 0833    Visit Number 6    Number of Visits 12    Date for PT Re-Evaluation 12/01/20    Authorization Type FOTO AT LEAST EVERY 5TH VISIT.  PROGRESS NOTE AT 10TH VISIT.  KX MODIFIER AFTER 15 VISITS.    PT Start Time [id number]    PT Stop Time 0858    PT Time Calculation (min) 42 min    Activity Tolerance Patient tolerated treatment well    Behavior During Therapy [facility] for tasks assessed/performed           Past Medical History:  Diagnosis Date  . Bilateral recurrent inguinal hernia   . BPH with obstruction/lower urinary tract symptoms   . Cough 09/20/2016  . DDD (degenerative disc disease), cervical   . Diverticulosis of colon   . GERD (gastroesophageal reflux disease)   . Hiatal hernia   . History of adenomatous polyp of colon    2006 tubular adenoma;  2010 tubular adenoma and hyperplastic  . Hyperlipidemia   . OA (osteoarthritis)    hands  . Organic sexual dysfunction   . Pre-diabetes   . S/P dilatation of esophageal stricture    multiple times --  last time w/ balloon 12-31-2015  . Wears dentures    upper denture, lower partial  . Wears glasses     Past Surgical History:  Procedure Laterality Date  . COLONOSCOPY  last one 03-14-2011  . ESOPHAGOGASTRODUODENOSCOPY (EGD) WITH ESOPHAGEAL DILATION  last one 12-31-2015  . HEMORROIDECTOMY  1970's  . INGUINAL HERNIA REPAIR Bilateral 1980's  . PENILE PROSTHESIS IMPLANT N/A 09/29/2016   Procedure: PENILE PROTHESIS INFLATABLE;  Surgeon: Carolan Clines, MD;  Location: [facility];  Service: Urology;  Laterality: N/A;  . UMBILICAL HERNIA REPAIR  2007 approx     There were no vitals filed for this visit.   Subjective Assessment - 11/19/20 0827    Subjective COVID-19 screening performed upon arrival. Patient arrives with ongoing soreness of L hip and feeling like it is giving.    Pertinent History Hernia, DDD, OA.    How long can you walk comfortably? Have to sit when out shopping with wife.    Patient Stated Goals Get out of pain and walk without pain.    Currently in Pain? Other (Comment)   No pain assessment provided             [facility] PT Assessment - 11/19/20 0001      Assessment   Medical Diagnosis Pain in left hip.    Referring Provider (PT) Suella Broad MD    Next MD Visit TBD      Precautions   Precautions None      Restrictions   Weight Bearing Restrictions No                         [facility] Adult PT Treatment/Exercise - 11/19/20 0001      Knee/Hip Exercises: Aerobic   Nustep L3 x16 min   1 mi     Knee/Hip Exercises: Standing   Heel Raises Both;20 reps;2 sets    Heel Raises Limitations B toe raise2 x20 reps  Hip Abduction AROM;Both;2 sets;10 reps;Knee straight    Wall Squat 2 sets;10 reps      Knee/Hip Exercises: Supine   Bridges Strengthening;20 reps    Other Supine Knee/Hip Exercises B hip clam green theraband x20 reps    Other Supine Knee/Hip Exercises B march green theraband x20 reps      Knee/Hip Exercises: Sidelying   Hip ABduction AROM;Left;2 sets;10 reps    Clams 2x10                       PT Long Term Goals - 11/03/20 1212      PT LONG TERM GOAL #1   Title Independent with a HEP.    Time 4    Period Weeks    Status New      PT LONG TERM GOAL #2   Title Restore normal left hip range of motion.    Time 4    Period Weeks    Status New      PT LONG TERM GOAL #3   Title Walk with wife while shopping without having to stop due to left hip pain.    Time 4    Period Weeks    Status New      PT LONG TERM GOAL #4   Title Perform ADL's with pain not > 2-3/10.     Time 4    Period Weeks    Status New                 Plan - 11/19/20 0912    Clinical Impression Statement Patient presented in clinic with reports of continued L hip soreness but also reports a "giving." Patient able to tolerate all therex with no complaints of increased pain. Patient did require mod multimodal cueing for technique correction due to posterior rotation of hips in SL.    Personal Factors and Comorbidities Comorbidity 1;Comorbidity 2;Other    Comorbidities Hernia, DDD, OA.    Examination-Activity Limitations Locomotion Level;Other    Stability/Clinical Decision Making Evolving/Moderate complexity    Rehab Potential Good    PT Frequency 2x / week    PT Duration 6 weeks    PT Treatment/Interventions ADLs/Self Care Home Management;Cryotherapy;Electrical Stimulation;Ultrasound;Moist Heat;Iontophoresis 4mg /ml Dexamethasone;Functional mobility training;Therapeutic activities;Therapeutic exercise;Manual techniques;Patient/family education;Passive range of motion;Dry needling;Joint Manipulations    PT Next Visit Plan Assess goalsl and FOTO; Left SKTC stretch, hip stretch(s) to increase IR, Nustep and progression to bike.  Modalties and/or STW/M as needed.    Consulted and Agree with Plan of Care Patient           Patient will benefit from skilled therapeutic intervention in order to improve the following deficits and impairments:  Decreased activity tolerance  Visit Diagnosis: Pain in left hip  Stiffness of left hip, not elsewhere classified     Problem List Patient Active Problem List   Diagnosis Date Noted  . Jet lag syndrome 02/24/2020  . Chronic bilateral low back pain without sciatica 09/02/2019  . Piriformis syndrome, right 09/30/2018  . Essential hypertension 08/28/2018  . DDD (degenerative disc disease), cervical 07/29/2015  . Prediabetes 06/30/2013  . Erectile dysfunction 06/30/2013  . Routine general medical examination at a health care facility  04/12/2011  . ESOPHAGEAL STRICTURE 12/14/2008  . Gastritis and gastroduodenitis 12/14/2008  . BPH associated with nocturia 12/03/2008  . Osteoarthrosis, hand 12/03/2008  . Hyperlipidemia with target LDL less than 130 09/28/2008    Elizebeth Koller Rashad Auld, PTA 11/19/2020, 9:18 AM  [facility] [place], [place], [address] Phone: [phone]   Fax:  [phone]  Name: TIMMEY LAMBA MRN: [id number] Date of Birth: 08/20/1941

## 2020-11-22 ENCOUNTER — Other Ambulatory Visit: Payer: Self-pay

## 2020-11-22 ENCOUNTER — Encounter: Payer: Self-pay | Admitting: Physical Therapy

## 2020-11-22 ENCOUNTER — Ambulatory Visit: Payer: Medicare Other | Admitting: Physical Therapy

## 2020-11-22 DIAGNOSIS — M25652 Stiffness of left hip, not elsewhere classified: Secondary | ICD-10-CM

## 2020-11-22 DIAGNOSIS — M25552 Pain in left hip: Secondary | ICD-10-CM | POA: Diagnosis not present

## 2020-11-22 NOTE — Therapy (Signed)
[facility] [place], [place], [address] Phone: [phone]   Fax:  [phone]  Physical Therapy Treatment  Patient Details  Name: Don Johnson MRN: [id number] Date of Birth: 1940/09/13 Referring Provider (PT): Suella Broad MD   Encounter Date: 11/22/2020   PT End of Session - 11/22/20 0748    Visit Number 7    Number of Visits 12    Date for PT Re-Evaluation 12/01/20    Authorization Type FOTO AT LEAST EVERY 5TH VISIT.  PROGRESS NOTE AT 10TH VISIT.  KX MODIFIER AFTER 15 VISITS.    PT Start Time 0730   arrived prior to sign in time   PT Stop Time 0815    PT Time Calculation (min) 45 min    Activity Tolerance Patient tolerated treatment well    Behavior During Therapy [facility] for tasks assessed/performed           Past Medical History:  Diagnosis Date  . Bilateral recurrent inguinal hernia   . BPH with obstruction/lower urinary tract symptoms   . Cough 09/20/2016  . DDD (degenerative disc disease), cervical   . Diverticulosis of colon   . GERD (gastroesophageal reflux disease)   . Hiatal hernia   . History of adenomatous polyp of colon    2006 tubular adenoma;  2010 tubular adenoma and hyperplastic  . Hyperlipidemia   . OA (osteoarthritis)    hands  . Organic sexual dysfunction   . Pre-diabetes   . S/P dilatation of esophageal stricture    multiple times --  last time w/ balloon 12-31-2015  . Wears dentures    upper denture, lower partial  . Wears glasses     Past Surgical History:  Procedure Laterality Date  . COLONOSCOPY  last one 03-14-2011  . ESOPHAGOGASTRODUODENOSCOPY (EGD) WITH ESOPHAGEAL DILATION  last one 12-31-2015  . HEMORROIDECTOMY  1970's  . INGUINAL HERNIA REPAIR Bilateral 1980's  . PENILE PROSTHESIS IMPLANT N/A 09/29/2016   Procedure: PENILE PROTHESIS INFLATABLE;  Surgeon: Carolan Clines, MD;  Location: [facility];  Service: Urology;  Laterality: N/A;  . UMBILICAL HERNIA  REPAIR  2007 approx    There were no vitals filed for this visit.   Subjective Assessment - 11/22/20 0738    Subjective COVID-19 screening performed upon arrival. Patient arrives with no pain in left hip today.    Pertinent History Hernia, DDD, OA.    How long can you walk comfortably? Have to sit when out shopping with wife.    Patient Stated Goals Get out of pain and walk without pain.    Currently in Pain? No/denies              [facility] PT Assessment - 11/22/20 0001      Assessment   Medical Diagnosis Pain in left hip.    Referring Provider (PT) Suella Broad MD    Next MD Visit TBD      Precautions   Precautions None      Restrictions   Weight Bearing Restrictions No      Observation/Other Assessments   Focus on Therapeutic Outcomes (FOTO)  42% limitation                         [facility] Adult PT Treatment/Exercise - 11/22/20 0001      Exercises   Exercises Knee/Hip      Knee/Hip Exercises: Stretches   Other Knee/Hip Stretches LLE SKTC 3x30 seconds  Knee/Hip Exercises: Aerobic   Nustep L4 x15 min      Knee/Hip Exercises: Standing   Knee Flexion Strengthening;Left;20 reps    Hip Flexion Stengthening;Left;20 reps;Knee bent    Hip Abduction AROM;Knee straight;Stengthening;Left;20 reps    Forward Step Up Left;10 reps;20 reps;Hand Hold: 1;Step Height: 8"    Rocker Board 3 minutes    Other Standing Knee Exercises lateral stepping red theraband x5      Knee/Hip Exercises: Supine   Bridges Strengthening;20 reps    Other Supine Knee/Hip Exercises B hip clam green theraband x30 reps                       PT Long Term Goals - 11/22/20 0805      PT LONG TERM GOAL #1   Title Independent with a HEP.    Time 4    Period Weeks    Status On-going      PT LONG TERM GOAL #2   Title Restore normal left hip range of motion.    Time 4    Period Weeks    Status On-going      PT LONG TERM GOAL #3   Title Walk with wife while shopping  without having to stop due to left hip pain.    Time 4    Period Weeks    Status On-going      PT LONG TERM GOAL #4   Title Perform ADL's with pain not > 2-3/10.    Period Weeks    Status On-going                 Plan - 11/22/20 0805    Clinical Impression Statement Patient was able to tolerate treatment fairly well though did report some fatigue and soreness with standing TEs. Patient provided with verbal cuing for form and technique with good carryover for remaining reps. Goals ongoing at this time but does report improvement with pain. FOTO limitation 42%.    Personal Factors and Comorbidities Comorbidity 1;Comorbidity 2;Other    Comorbidities Hernia, DDD, OA.    Examination-Activity Limitations Locomotion Level;Other    Stability/Clinical Decision Making Evolving/Moderate complexity    Clinical Decision Making Low    Rehab Potential Good    PT Frequency 2x / week    PT Duration 6 weeks    PT Treatment/Interventions ADLs/Self Care Home Management;Cryotherapy;Electrical Stimulation;Ultrasound;Moist Heat;Iontophoresis 4mg /ml Dexamethasone;Functional mobility training;Therapeutic activities;Therapeutic exercise;Manual techniques;Patient/family education;Passive range of motion;Dry needling;Joint Manipulations    PT Next Visit Plan Continue with POC, Left SKTC stretch, hip stretch(s) to increase IR, Nustep and progression to bike.  Modalties and/or STW/M as needed.    Consulted and Agree with Plan of Care Patient           Patient will benefit from skilled therapeutic intervention in order to improve the following deficits and impairments:  Decreased activity tolerance  Visit Diagnosis: Pain in left hip  Stiffness of left hip, not elsewhere classified     Problem List Patient Active Problem List   Diagnosis Date Noted  . Jet lag syndrome 02/24/2020  . Chronic bilateral low back pain without sciatica 09/02/2019  . Piriformis syndrome, right 09/30/2018  . Essential  hypertension 08/28/2018  . DDD (degenerative disc disease), cervical 07/29/2015  . Prediabetes 06/30/2013  . Erectile dysfunction 06/30/2013  . Routine general medical examination at a health care facility 04/12/2011  . ESOPHAGEAL STRICTURE 12/14/2008  . Gastritis and gastroduodenitis 12/14/2008  . BPH associated with nocturia 12/03/2008  .  Osteoarthrosis, hand 12/03/2008  . Hyperlipidemia with target LDL less than 130 09/28/2008    Gabriela Eves, PT, DPT 11/22/2020, 8:20 AM  [facility] [address] [place], [place], [address] Phone: [phone]   Fax:  [phone]  Name: Don Johnson MRN: [id number] Date of Birth: [birth year]

## 2020-11-25 ENCOUNTER — Encounter: Payer: Self-pay | Admitting: Physical Therapy

## 2020-11-25 ENCOUNTER — Ambulatory Visit: Payer: Medicare Other | Admitting: Physical Therapy

## 2020-11-25 ENCOUNTER — Other Ambulatory Visit: Payer: Self-pay

## 2020-11-25 DIAGNOSIS — M25552 Pain in left hip: Secondary | ICD-10-CM

## 2020-11-25 DIAGNOSIS — M25652 Stiffness of left hip, not elsewhere classified: Secondary | ICD-10-CM | POA: Diagnosis not present

## 2020-11-25 NOTE — Therapy (Signed)
[facility] [place], [place], [address] Phone: [phone]   Fax:  [phone]  Physical Therapy Treatment  Patient Details  Name: Don Johnson MRN: [id number] Date of Birth: Oct 17, 1940 Referring Provider (PT): Suella Broad MD   Encounter Date: 11/25/2020   PT End of Session - 11/25/20 0758    Visit Number 8    Number of Visits 12    Date for PT Re-Evaluation 12/01/20    Authorization Type FOTO AT LEAST EVERY 5TH VISIT.  PROGRESS NOTE AT 10TH VISIT.  KX MODIFIER AFTER 15 VISITS.    PT Start Time 0739    PT Stop Time 0819    PT Time Calculation (min) 40 min    Activity Tolerance Patient tolerated treatment well    Behavior During Therapy [facility] for tasks assessed/performed           Past Medical History:  Diagnosis Date  . Bilateral recurrent inguinal hernia   . BPH with obstruction/lower urinary tract symptoms   . Cough 09/20/2016  . DDD (degenerative disc disease), cervical   . Diverticulosis of colon   . GERD (gastroesophageal reflux disease)   . Hiatal hernia   . History of adenomatous polyp of colon    2006 tubular adenoma;  2010 tubular adenoma and hyperplastic  . Hyperlipidemia   . OA (osteoarthritis)    hands  . Organic sexual dysfunction   . Pre-diabetes   . S/P dilatation of esophageal stricture    multiple times --  last time w/ balloon 12-31-2015  . Wears dentures    upper denture, lower partial  . Wears glasses     Past Surgical History:  Procedure Laterality Date  . COLONOSCOPY  last one 03-14-2011  . ESOPHAGOGASTRODUODENOSCOPY (EGD) WITH ESOPHAGEAL DILATION  last one 12-31-2015  . HEMORROIDECTOMY  1970's  . INGUINAL HERNIA REPAIR Bilateral 1980's  . PENILE PROSTHESIS IMPLANT N/A 09/29/2016   Procedure: PENILE PROTHESIS INFLATABLE;  Surgeon: Carolan Clines, MD;  Location: [facility];  Service: Urology;  Laterality: N/A;  . UMBILICAL HERNIA REPAIR  2007 approx     There were no vitals filed for this visit.   Subjective Assessment - 11/25/20 0757    Subjective COVID-19 screening performed upon arrival. Patient arrives with soreness after riding to [facility] [place] [facility] and back yesterday. Didn't do much exercise yesterday but normally riding for a long time causes soreness.    Pertinent History Hernia, DDD, OA.    How long can you walk comfortably? Have to sit when out shopping with wife.    Patient Stated Goals Get out of pain and walk without pain.    Currently in Pain? Yes    Pain Score 2     Pain Location Hip    Pain Orientation Left    Pain Descriptors / Indicators Sore    Pain Type Acute pain    Pain Onset More than a month ago    Pain Frequency Constant              [facility] PT Assessment - 11/25/20 0001      Assessment   Medical Diagnosis Pain in left hip.    Referring Provider (PT) Suella Broad MD    Next MD Visit TBD      Precautions   Precautions None      Restrictions   Weight Bearing Restrictions No  [facility] Adult PT Treatment/Exercise - 11/25/20 0001      Knee/Hip Exercises: Aerobic   Nustep L4 x16 min      Knee/Hip Exercises: Machines for Strengthening   Cybex Leg Press 2 pl, sesat 5 x30 reps      Knee/Hip Exercises: Standing   Hip Abduction AROM;Both;20 reps;Knee straight    Abduction Limitations L fire hydrant x20 reps    Hip Extension AROM;Left;20 reps;Knee bent    Lateral Step Up Left;2 sets;10 reps;Hand Hold: 2;Step Height: 6"    Forward Step Up Left;2 sets;10 reps;Hand Hold: 2;Step Height: 6"      Knee/Hip Exercises: Seated   Sit to Sand 15 reps;with UE support   green theraband clam     Knee/Hip Exercises: Supine   Bridges with Diona Foley Squeeze Strengthening;Both;10 reps                       PT Long Term Goals - 11/22/20 0805      PT LONG TERM GOAL #1   Title Independent with a HEP.    Time 4    Period Weeks    Status On-going      PT LONG TERM GOAL #2    Title Restore normal left hip range of motion.    Time 4    Period Weeks    Status On-going      PT LONG TERM GOAL #3   Title Walk with wife while shopping without having to stop due to left hip pain.    Time 4    Period Weeks    Status On-going      PT LONG TERM GOAL #4   Title Perform ADL's with pain not > 2-3/10.    Period Weeks    Status On-going                 Plan - 11/25/20 0827    Clinical Impression Statement Patient presented in clinic with reports of increased soreness after riding for a long period of time in the car. Patient progressed to more resisted and functional therex with no complaints of pain or accelerated soreness. Goals remain on-going at this time due to deficits and pain.    Personal Factors and Comorbidities Comorbidity 1;Comorbidity 2;Other    Comorbidities Hernia, DDD, OA.    Examination-Activity Limitations Locomotion Level;Other    Stability/Clinical Decision Making Evolving/Moderate complexity    Rehab Potential Good    PT Frequency 2x / week    PT Duration 6 weeks    PT Treatment/Interventions ADLs/Self Care Home Management;Cryotherapy;Electrical Stimulation;Ultrasound;Moist Heat;Iontophoresis 4mg /ml Dexamethasone;Functional mobility training;Therapeutic activities;Therapeutic exercise;Manual techniques;Patient/family education;Passive range of motion;Dry needling;Joint Manipulations    PT Next Visit Plan Continue with POC, Left SKTC stretch, hip stretch(s) to increase IR, Nustep and progression to bike.  Modalties and/or STW/M as needed.    Consulted and Agree with Plan of Care Patient           Patient will benefit from skilled therapeutic intervention in order to improve the following deficits and impairments:  Decreased activity tolerance  Visit Diagnosis: Pain in left hip  Stiffness of left hip, not elsewhere classified     Problem List Patient Active Problem List   Diagnosis Date Noted  . Jet lag syndrome 02/24/2020  .  Chronic bilateral low back pain without sciatica 09/02/2019  . Piriformis syndrome, right 09/30/2018  . Essential hypertension 08/28/2018  . DDD (degenerative disc disease), cervical 07/29/2015  . Prediabetes 06/30/2013  . Erectile  dysfunction 06/30/2013  . Routine general medical examination at a health care facility 04/12/2011  . ESOPHAGEAL STRICTURE 12/14/2008  . Gastritis and gastroduodenitis 12/14/2008  . BPH associated with nocturia 12/03/2008  . Osteoarthrosis, hand 12/03/2008  . Hyperlipidemia with target LDL less than 130 09/28/2008    Standley Brooking, PTA 11/25/2020, 8:39 AM  [facility] [address] [place], [place], [address] Phone: [phone]   Fax:  [phone]  Name: Don Johnson MRN: [id number] Date of Birth: [birth year]

## 2020-11-30 ENCOUNTER — Other Ambulatory Visit: Payer: Self-pay

## 2020-11-30 ENCOUNTER — Ambulatory Visit: Payer: Medicare Other | Admitting: Physical Therapy

## 2020-11-30 ENCOUNTER — Encounter: Payer: Self-pay | Admitting: Physical Therapy

## 2020-11-30 DIAGNOSIS — M25552 Pain in left hip: Secondary | ICD-10-CM | POA: Diagnosis not present

## 2020-11-30 DIAGNOSIS — M25652 Stiffness of left hip, not elsewhere classified: Secondary | ICD-10-CM

## 2020-11-30 NOTE — Therapy (Signed)
[facility] [place], [place], [address] Phone: [phone]   Fax:  [phone]  Physical Therapy Treatment  Patient Details  Name: MCCABE GLORIA MRN: [id number] Date of Birth: 1941-01-08 Referring Provider (PT): Suella Broad MD   Encounter Date: 11/30/2020   PT End of Session - 11/30/20 0832    Visit Number 9    Number of Visits 12    Date for PT Re-Evaluation 12/01/20    Authorization Type FOTO AT LEAST EVERY 5TH VISIT.  PROGRESS NOTE AT 10TH VISIT.  KX MODIFIER AFTER 15 VISITS.    PT Start Time [phone]    PT Stop Time 0857    PT Time Calculation (min) 40 min    Activity Tolerance Patient limited by fatigue    Behavior During Therapy [facility] for tasks assessed/performed           Past Medical History:  Diagnosis Date  . Bilateral recurrent inguinal hernia   . BPH with obstruction/lower urinary tract symptoms   . Cough 09/20/2016  . DDD (degenerative disc disease), cervical   . Diverticulosis of colon   . GERD (gastroesophageal reflux disease)   . Hiatal hernia   . History of adenomatous polyp of colon    2006 tubular adenoma;  2010 tubular adenoma and hyperplastic  . Hyperlipidemia   . OA (osteoarthritis)    hands  . Organic sexual dysfunction   . Pre-diabetes   . S/P dilatation of esophageal stricture    multiple times --  last time w/ balloon 12-31-2015  . Wears dentures    upper denture, lower partial  . Wears glasses     Past Surgical History:  Procedure Laterality Date  . COLONOSCOPY  last one 03-14-2011  . ESOPHAGOGASTRODUODENOSCOPY (EGD) WITH ESOPHAGEAL DILATION  last one 12-31-2015  . HEMORROIDECTOMY  1970's  . INGUINAL HERNIA REPAIR Bilateral 1980's  . PENILE PROSTHESIS IMPLANT N/A 09/29/2016   Procedure: PENILE PROTHESIS INFLATABLE;  Surgeon: Carolan Clines, MD;  Location: [facility];  Service: Urology;  Laterality: N/A;  . UMBILICAL HERNIA REPAIR  2007 approx    There were  no vitals filed for this visit.   Subjective Assessment - 11/30/20 0831    Subjective COVID-19 screening performed upon arrival. Patient arrives with less hip pain as he states he has good and bad days.    Pertinent History Hernia, DDD, OA.    How long can you walk comfortably? Have to sit when out shopping with wife.    Patient Stated Goals Get out of pain and walk without pain.    Currently in Pain? No/denies              [facility] PT Assessment - 11/30/20 0001      Assessment   Medical Diagnosis Pain in left hip.    Referring Provider (PT) Suella Broad MD    Next MD Visit TBD                         [facility] Adult PT Treatment/Exercise - 11/30/20 0001      Knee/Hip Exercises: Aerobic   Nustep L5 x15 min      Knee/Hip Exercises: Standing   Heel Raises Both;20 reps    Heel Raises Limitations B toe raise x20 reps    Hip Abduction AROM;Both;20 reps;Knee straight    Abduction Limitations L fire hydrant x20 reps    Hip Extension AROM;Left;20 reps;Knee bent    Lateral Step Up  Left;2 sets;10 reps;Hand Hold: 2;Step Height: 8"    Forward Step Up Left;2 sets;10 reps;Hand Hold: 2;Step Height: 8"      Knee/Hip Exercises: Seated   Sit to Sand 20 reps;without UE support   red theraband for clam     Knee/Hip Exercises: Supine   Bridges with [employer] Strengthening;Both;20 reps    Straight Leg Raises Strengthening;Left;20 reps    Other Supine Knee/Hip Exercises marching red theraband x20 rpes      Knee/Hip Exercises: Sidelying   Hip ABduction Strengthening;Left;10 reps    Clams 2x10 L hip red theraband                       PT Long Term Goals - 11/22/20 0805      PT LONG TERM GOAL #1   Title Independent with a HEP.    Time 4    Period Weeks    Status On-going      PT LONG TERM GOAL #2   Title Restore normal left hip range of motion.    Time 4    Period Weeks    Status On-going      PT LONG TERM GOAL #3   Title Walk with wife while shopping  without having to stop due to left hip pain.    Time 4    Period Weeks    Status On-going      PT LONG TERM GOAL #4   Title Perform ADL's with pain not > 2-3/10.    Period Weeks    Status On-going                 Plan - 11/30/20 0916    Clinical Impression Statement Patient presented in clinic with no reports of L hip pain although he has good and bad days related to hip pain. Patient guided through therex with no complaints of pain but of muscle fatigue and discomfort. Min extensor lag noted in SLR and hip abduction.    Personal Factors and Comorbidities Comorbidity 1;Comorbidity 2;Other    Comorbidities Hernia, DDD, OA.    Examination-Activity Limitations Locomotion Level;Other    Stability/Clinical Decision Making Evolving/Moderate complexity    Rehab Potential Good    PT Frequency 2x / week    PT Duration 6 weeks    PT Treatment/Interventions ADLs/Self Care Home Management;Cryotherapy;Electrical Stimulation;Ultrasound;Moist Heat;Iontophoresis 4mg /ml Dexamethasone;Functional mobility training;Therapeutic activities;Therapeutic exercise;Manual techniques;Patient/family education;Passive range of motion;Dry needling;Joint Manipulations    PT Next Visit Plan Continue with POC, Left SKTC stretch, hip stretch(s) to increase IR, Nustep and progression to bike.  Modalties and/or STW/M as needed.    Consulted and Agree with Plan of Care Patient           Patient will benefit from skilled therapeutic intervention in order to improve the following deficits and impairments:  Decreased activity tolerance  Visit Diagnosis: Pain in left hip  Stiffness of left hip, not elsewhere classified     Problem List Patient Active Problem List   Diagnosis Date Noted  . Jet lag syndrome 02/24/2020  . Chronic bilateral low back pain without sciatica 09/02/2019  . Piriformis syndrome, right 09/30/2018  . Essential hypertension 08/28/2018  . DDD (degenerative disc disease), cervical  07/29/2015  . Prediabetes 06/30/2013  . Erectile dysfunction 06/30/2013  . Routine general medical examination at a health care facility 04/12/2011  . ESOPHAGEAL STRICTURE 12/14/2008  . Gastritis and gastroduodenitis 12/14/2008  . BPH associated with nocturia 12/03/2008  . Osteoarthrosis, hand 12/03/2008  .  Hyperlipidemia with target LDL less than 130 09/28/2008    Standley Brooking, PTA 11/30/2020, 9:18 AM  [facility] [place], [place], [address] Phone: [phone]   Fax:  [phone]  Name: KENROY TIMBERMAN MRN: [id number] Date of Birth: [birth year]

## 2020-12-02 ENCOUNTER — Other Ambulatory Visit: Payer: Self-pay

## 2020-12-02 ENCOUNTER — Ambulatory Visit: Payer: Medicare Other | Admitting: Physical Therapy

## 2020-12-02 ENCOUNTER — Encounter: Payer: Self-pay | Admitting: Physical Therapy

## 2020-12-02 DIAGNOSIS — M25552 Pain in left hip: Secondary | ICD-10-CM

## 2020-12-02 DIAGNOSIS — M25652 Stiffness of left hip, not elsewhere classified: Secondary | ICD-10-CM | POA: Diagnosis not present

## 2020-12-02 NOTE — Therapy (Signed)
[facility] [place], [place], [address] Phone: [phone]   Fax:  [phone]  Physical Therapy Treatment  .Progress Note Reporting Period 11/03/2020 to 12/02/2020  See note below for Objective Data and Assessment of Progress/Goals. Patient has made some progress with goals and has good days and bad days where his hip would catch or his leg would get weak.    Patient Details  Name: Don Johnson MRN: [id number] Date of Birth: 1941-01-01 Referring Provider (PT): Suella Broad MD   Encounter Date: 12/02/2020   PT End of Session - 12/02/20 0745    Visit Number 10    Number of Visits 12    Date for PT Re-Evaluation 12/01/20    Authorization Type FOTO AT LEAST EVERY 5TH VISIT.  PROGRESS NOTE AT 10TH VISIT.  KX MODIFIER AFTER 15 VISITS.    PT Start Time [phone]    PT Stop Time 0815    PT Time Calculation (min) 41 min    Activity Tolerance Patient limited by fatigue    Behavior During Therapy [facility] for tasks assessed/performed           Past Medical History:  Diagnosis Date  . Bilateral recurrent inguinal hernia   . BPH with obstruction/lower urinary tract symptoms   . Cough 09/20/2016  . DDD (degenerative disc disease), cervical   . Diverticulosis of colon   . GERD (gastroesophageal reflux disease)   . Hiatal hernia   . History of adenomatous polyp of colon    2006 tubular adenoma;  2010 tubular adenoma and hyperplastic  . Hyperlipidemia   . OA (osteoarthritis)    hands  . Organic sexual dysfunction   . Pre-diabetes   . S/P dilatation of esophageal stricture    multiple times --  last time w/ balloon 12-31-2015  . Wears dentures    upper denture, lower partial  . Wears glasses     Past Surgical History:  Procedure Laterality Date  . COLONOSCOPY  last one 03-14-2011  . ESOPHAGOGASTRODUODENOSCOPY (EGD) WITH ESOPHAGEAL DILATION  last one 12-31-2015  . HEMORROIDECTOMY  1970's  . INGUINAL HERNIA REPAIR Bilateral  1980's  . PENILE PROSTHESIS IMPLANT N/A 09/29/2016   Procedure: PENILE PROTHESIS INFLATABLE;  Surgeon: Carolan Clines, MD;  Location: [facility];  Service: Urology;  Laterality: N/A;  . UMBILICAL HERNIA REPAIR  2007 approx    There were no vitals filed for this visit.   Subjective Assessment - 12/02/20 0744    Subjective COVID-19 screening performed upon arrival. Patient reports feeling fair.    Pertinent History Hernia, DDD, OA.    How long can you walk comfortably? Have to sit when out shopping with wife.    Patient Stated Goals Get out of pain and walk without pain.    Currently in Pain? No/denies              [facility] PT Assessment - 12/02/20 0001      Assessment   Medical Diagnosis Pain in left hip.    Referring Provider (PT) Suella Broad MD    Next MD Visit TBD                         [facility] Adult PT Treatment/Exercise - 12/02/20 0001      Knee/Hip Exercises: Aerobic   Nustep L5 x15 min      Knee/Hip Exercises: Machines for Strengthening   Cybex Knee Extension 10# 2x15    Cybex Knee  Flexion 20# 2x15    Cybex Leg Press 2 pl, seat 5 x30 reps      Knee/Hip Exercises: Standing   Lateral Step Up Left;2 sets;10 reps;Step Height: 8";Hand Hold: 1   holding with left x10 holding with right x10   Forward Step Up Left;2 sets;10 reps;Hand Hold: 2;Step Height: 8"   left hand hold x10 right hand hold x10     Knee/Hip Exercises: Sidelying   Clams 2x10 L hip red theraband                       PT Long Term Goals - 12/02/20 0759      PT LONG TERM GOAL #1   Title Independent with a HEP.    Time 4    Period Weeks    Status Partially Met      PT LONG TERM GOAL #2   Title Restore normal left hip range of motion.    Time 4    Period Weeks    Status On-going      PT LONG TERM GOAL #3   Title Walk with wife while shopping without having to stop due to left hip pain.    Time 4    Period Weeks    Status Partially Met      PT  LONG TERM GOAL #4   Title Perform ADL's with pain not > 2-3/10.    Time 4    Period Weeks    Status Achieved                 Plan - 12/02/20 0829    Clinical Impression Statement Patient arrives feeling fair in left hip. Patient was able to complete progression of TEs with no increase of pain, just some soreness. Patient has achieved some goals but others are ongoing at this time.    Personal Factors and Comorbidities Comorbidity 1;Comorbidity 2;Other    Comorbidities Hernia, DDD, OA.    Examination-Activity Limitations Locomotion Level;Other    Stability/Clinical Decision Making Evolving/Moderate complexity    Clinical Decision Making Low    Rehab Potential Good    PT Frequency 2x / week    PT Duration 6 weeks    PT Treatment/Interventions ADLs/Self Care Home Management;Cryotherapy;Electrical Stimulation;Ultrasound;Moist Heat;Iontophoresis 32m/ml Dexamethasone;Functional mobility training;Therapeutic activities;Therapeutic exercise;Manual techniques;Patient/family education;Passive range of motion;Dry needling;Joint Manipulations    PT Next Visit Plan Continue with POC, Left SKTC stretch, hip stretch(s) to increase IR, Nustep and progression to bike.  Modalties and/or STW/M as needed.    Consulted and Agree with Plan of Care Patient           Patient will benefit from skilled therapeutic intervention in order to improve the following deficits and impairments:  Decreased activity tolerance  Visit Diagnosis: Pain in left hip  Stiffness of left hip, not elsewhere classified     Problem List Patient Active Problem List   Diagnosis Date Noted  . Jet lag syndrome 02/24/2020  . Chronic bilateral low back pain without sciatica 09/02/2019  . Piriformis syndrome, right 09/30/2018  . Essential hypertension 08/28/2018  . DDD (degenerative disc disease), cervical 07/29/2015  . Prediabetes 06/30/2013  . Erectile dysfunction 06/30/2013  . Routine general medical examination at  a health care facility 04/12/2011  . ESOPHAGEAL STRICTURE 12/14/2008  . Gastritis and gastroduodenitis 12/14/2008  . BPH associated with nocturia 12/03/2008  . Osteoarthrosis, hand 12/03/2008  . Hyperlipidemia with target LDL less than 130 09/28/2008    KGabriela Eves PT, DPT  12/02/2020, 8:39 AM  [facility] [address] [place], [place], [address] Phone: [phone]   Fax:  [phone]  Name: Don Johnson MRN: [id number] Date of Birth: [birth year]

## 2020-12-06 ENCOUNTER — Ambulatory Visit: Payer: Medicare Other | Admitting: Physical Therapy

## 2020-12-07 ENCOUNTER — Other Ambulatory Visit: Payer: Self-pay

## 2020-12-07 ENCOUNTER — Ambulatory Visit: Payer: Medicare Other | Admitting: *Deleted

## 2020-12-07 DIAGNOSIS — M25652 Stiffness of left hip, not elsewhere classified: Secondary | ICD-10-CM

## 2020-12-07 DIAGNOSIS — M25552 Pain in left hip: Secondary | ICD-10-CM | POA: Diagnosis not present

## 2020-12-07 NOTE — Therapy (Signed)
[facility] [place], [place], [address] Phone: [phone]   Fax:  [phone]  Physical Therapy Treatment  Patient Details  Name: Don Johnson MRN: [id number] Date of Birth: 1940-11-17 Referring Provider (PT): Suella Broad MD   Encounter Date: 12/07/2020   PT End of Session - 12/07/20 0824    Visit Number 11    Number of Visits 12    Date for PT Re-Evaluation 12/01/20    Authorization Type FOTO AT LEAST EVERY 5TH VISIT.  PROGRESS NOTE AT 10TH VISIT.  KX MODIFIER AFTER 15 VISITS.    PT Start Time 0815    PT Stop Time 0902    PT Time Calculation (min) 47 min           Past Medical History:  Diagnosis Date  . Bilateral recurrent inguinal hernia   . BPH with obstruction/lower urinary tract symptoms   . Cough 09/20/2016  . DDD (degenerative disc disease), cervical   . Diverticulosis of colon   . GERD (gastroesophageal reflux disease)   . Hiatal hernia   . History of adenomatous polyp of colon    2006 tubular adenoma;  2010 tubular adenoma and hyperplastic  . Hyperlipidemia   . OA (osteoarthritis)    hands  . Organic sexual dysfunction   . Pre-diabetes   . S/P dilatation of esophageal stricture    multiple times --  last time w/ balloon 12-31-2015  . Wears dentures    upper denture, lower partial  . Wears glasses     Past Surgical History:  Procedure Laterality Date  . COLONOSCOPY  last one 03-14-2011  . ESOPHAGOGASTRODUODENOSCOPY (EGD) WITH ESOPHAGEAL DILATION  last one 12-31-2015  . HEMORROIDECTOMY  1970's  . INGUINAL HERNIA REPAIR Bilateral 1980's  . PENILE PROSTHESIS IMPLANT N/A 09/29/2016   Procedure: PENILE PROTHESIS INFLATABLE;  Surgeon: Carolan Clines, MD;  Location: [facility];  Service: Urology;  Laterality: N/A;  . UMBILICAL HERNIA REPAIR  2007 approx    There were no vitals filed for this visit.   Subjective Assessment - 12/07/20 0822    Subjective COVID-19 screening  performed upon arrival. Patient reports feeling ok. Hip pain is some better.    Pertinent History Hernia, DDD, OA.    How long can you walk comfortably? Have to sit when out shopping with wife.    Patient Stated Goals Get out of pain and walk without pain.    Currently in Pain? Yes    Pain Score 2     Pain Location Hip    Pain Orientation Left    Pain Descriptors / Indicators Sore    Pain Type Acute pain                             [facility] Adult PT Treatment/Exercise - 12/07/20 0001      Knee/Hip Exercises: Aerobic   Recumbent Bike L2  x 5 mins seat    Nustep L5 x10 min      Knee/Hip Exercises: Standing   Heel Raises Both;20 reps;2 sets    Heel Raises Limitations B toe raise 2x20 reps      Knee/Hip Exercises: Supine   Bridges 2 sets;10 reps   with AB bracing back straight   Bridges with [employer] Strengthening;Both;3 sets;10 reps   hold 5 secs     Knee/Hip Exercises: Sidelying   Clams 3x10    Other Sidelying Knee/Hip Exercises Reverse Clams 3x  10 ( most challenging) pillow BW knees                       PT Long Term Goals - 12/02/20 0759      PT LONG TERM GOAL #1   Title Independent with a HEP.    Time 4    Period Weeks    Status Partially Met      PT LONG TERM GOAL #2   Title Restore normal left hip range of motion.    Time 4    Period Weeks    Status On-going      PT LONG TERM GOAL #3   Title Walk with wife while shopping without having to stop due to left hip pain.    Time 4    Period Weeks    Status Partially Met      PT LONG TERM GOAL #4   Title Perform ADL's with pain not > 2-3/10.    Time 4    Period Weeks    Status Achieved                 Plan - 12/07/20 1012    Clinical Impression Statement Pt arrived today doing better overall with decreased hip pain. Rx focused  on strengthening as well as pain free AROM for IR and ER. and tolerated well.    Personal Factors and Comorbidities Comorbidity 1;Comorbidity  2;Other    Comorbidities Hernia, DDD, OA.    Examination-Activity Limitations Locomotion Level;Other    Stability/Clinical Decision Making Evolving/Moderate complexity    Rehab Potential Good    PT Frequency 2x / week    PT Duration 6 weeks    PT Treatment/Interventions ADLs/Self Care Home Management;Cryotherapy;Electrical Stimulation;Ultrasound;Moist Heat;Iontophoresis 41m/ml Dexamethasone;Functional mobility training;Therapeutic activities;Therapeutic exercise;Manual techniques;Patient/family education;Passive range of motion;Dry needling;Joint Manipulations    PT Next Visit Plan Continue with POC, Left SKTC stretch, hip stretch(s) to increase IR, Nustep and progression to bike.  Modalties and/or STW/M as needed.    Consulted and Agree with Plan of Care Patient           Patient will benefit from skilled therapeutic intervention in order to improve the following deficits and impairments:  Decreased activity tolerance  Visit Diagnosis: Pain in left hip  Stiffness of left hip, not elsewhere classified     Problem List Patient Active Problem List   Diagnosis Date Noted  . Jet lag syndrome 02/24/2020  . Chronic bilateral low back pain without sciatica 09/02/2019  . Piriformis syndrome, right 09/30/2018  . Essential hypertension 08/28/2018  . DDD (degenerative disc disease), cervical 07/29/2015  . Prediabetes 06/30/2013  . Erectile dysfunction 06/30/2013  . Routine general medical examination at a health care facility 04/12/2011  . ESOPHAGEAL STRICTURE 12/14/2008  . Gastritis and gastroduodenitis 12/14/2008  . BPH associated with nocturia 12/03/2008  . Osteoarthrosis, hand 12/03/2008  . Hyperlipidemia with target LDL less than 130 09/28/2008    Don Johnson,Don Johnson, PTA 12/07/2020, 10:23 AM  [facility] [address]Phone: [phone]  Fax:  [phone] Name: Don CECHMRN: [id number] of  Birth: 4Mar 30, 1942

## 2020-12-09 ENCOUNTER — Other Ambulatory Visit: Payer: Self-pay

## 2020-12-09 ENCOUNTER — Encounter: Payer: Self-pay | Admitting: Physical Therapy

## 2020-12-09 ENCOUNTER — Ambulatory Visit: Payer: Medicare Other | Admitting: Physical Therapy

## 2020-12-09 DIAGNOSIS — M25552 Pain in left hip: Secondary | ICD-10-CM

## 2020-12-09 DIAGNOSIS — M25652 Stiffness of left hip, not elsewhere classified: Secondary | ICD-10-CM | POA: Diagnosis not present

## 2020-12-09 NOTE — Therapy (Signed)
[facility] [place], [place], [address] Phone: [phone]   Fax:  [phone]  Physical Therapy Treatment PHYSICAL THERAPY DISCHARGE SUMMARY  Visits from Start of Care: 12  Current functional level related to goals / functional outcomes: See below   Remaining deficits: See goals   Education / Equipment: HEP Plan: Patient agrees to discharge.  Patient goals were partially met. Patient is being discharged due to financial reasons.  ?????     Patient Details  Name: Don Johnson MRN: [id number] Date of Birth: 1941/05/26 Referring Provider (PT): Suella Broad MD   Encounter Date: 12/09/2020   PT End of Session - 12/09/20 0751    Visit Number 12    Number of Visits 12    Date for PT Re-Evaluation 12/01/20    Authorization Type FOTO AT LEAST EVERY 5TH VISIT.  PROGRESS NOTE AT 10TH VISIT.  KX MODIFIER AFTER 15 VISITS.    PT Start Time 0730    PT Stop Time 0812    PT Time Calculation (min) 42 min    Activity Tolerance Patient tolerated treatment well    Behavior During Therapy WFL for tasks assessed/performed           Past Medical History:  Diagnosis Date  . Bilateral recurrent inguinal hernia   . BPH with obstruction/lower urinary tract symptoms   . Cough 09/20/2016  . DDD (degenerative disc disease), cervical   . Diverticulosis of colon   . GERD (gastroesophageal reflux disease)   . Hiatal hernia   . History of adenomatous polyp of colon    2006 tubular adenoma;  2010 tubular adenoma and hyperplastic  . Hyperlipidemia   . OA (osteoarthritis)    hands  . Organic sexual dysfunction   . Pre-diabetes   . S/P dilatation of esophageal stricture    multiple times --  last time w/ balloon 12-31-2015  . Wears dentures    upper denture, lower partial  . Wears glasses     Past Surgical History:  Procedure Laterality Date  . COLONOSCOPY  last one 03-14-2011  . ESOPHAGOGASTRODUODENOSCOPY (EGD) WITH  ESOPHAGEAL DILATION  last one 12-31-2015  . HEMORROIDECTOMY  1970's  . INGUINAL HERNIA REPAIR Bilateral 1980's  . PENILE PROSTHESIS IMPLANT N/A 09/29/2016   Procedure: PENILE PROTHESIS INFLATABLE;  Surgeon: Carolan Clines, MD;  Location: [facility];  Service: Urology;  Laterality: N/A;  . UMBILICAL HERNIA REPAIR  2007 approx    There were no vitals filed for this visit.   Subjective Assessment - 12/09/20 0750    Subjective COVID-19 screening performed upon arrival. Patient arrives feeling alright, minimal pain currently in right hip.    Pertinent History Hernia, DDD, OA.    How long can you walk comfortably? Have to sit when out shopping with wife.    Patient Stated Goals Get out of pain and walk without pain.    Currently in Pain? Yes    Pain Score --   did not provide number on pain scale             [facility] PT Assessment - 12/09/20 0001      Assessment   Medical Diagnosis Pain in left hip.    Referring Provider (PT) Suella Broad MD    Next MD Visit none      Strength   Strength Assessment Site Hip    Right/Left Hip Left    Left Hip Flexion 4+/5    Left Hip External Rotation 4-/5  Left Hip Internal Rotation 4-/5    Left Hip ABduction 4+/5    Left Hip ADduction 4+/5                         [facility] Adult PT Treatment/Exercise - 12/09/20 0001      Knee/Hip Exercises: Aerobic   Recumbent Bike L2  x 5 mins seat    Nustep L5 x10 min      Knee/Hip Exercises: Standing   Heel Raises Both;20 reps;2 sets    Heel Raises Limitations B toe raise 2x20 reps      Knee/Hip Exercises: Seated   Sit to Sand 20 reps;without UE support   x10 with L LE     Knee/Hip Exercises: Supine   Bridges 2 sets;10 reps                  PT Education - 12/09/20 1006    Education Details clams, reverse clams, bridging, hip ER and hip IR; red theraband    Person(s) Educated Patient    Methods Explanation;Demonstration;Handout    Comprehension  Verbalized understanding               PT Long Term Goals - 12/09/20 1011      PT LONG TERM GOAL #1   Title Independent with a HEP.    Time 4    Period Weeks    Status Achieved      PT LONG TERM GOAL #2   Title Restore normal left hip range of motion.    Time 4    Status Partially Met      PT LONG TERM GOAL #3   Title Walk with wife while shopping without having to stop due to left hip pain.    Time 4    Period Weeks    Status Partially Met      PT LONG TERM GOAL #4   Title Perform ADL's with pain not > 2-3/10.    Time 4    Period Weeks    Status Achieved                 Plan - 12/09/20 0757    Clinical Impression Statement Patient arrives to physical therapy with minimal right hip pain upon arrival. Patient guided through TEs with good technique and minimal reports of increased pain. Patient's goal partially met.  Patient and PT discussed advanced HEP and the importance of  performing to maintain gains in PT as well as to maintain flexibility and strength. Patient reported understanding. Patient's goals partially met and FOTO limitation 32%.    Personal Factors and Comorbidities Comorbidity 1;Comorbidity 2;Other    Comorbidities Hernia, DDD, OA.    Examination-Activity Limitations Locomotion Level;Other    Stability/Clinical Decision Making Evolving/Moderate complexity    Clinical Decision Making Low    Rehab Potential Good    PT Frequency 2x / week    PT Duration 6 weeks    PT Treatment/Interventions ADLs/Self Care Home Management;Cryotherapy;Electrical Stimulation;Ultrasound;Moist Heat;Iontophoresis 34m/ml Dexamethasone;Functional mobility training;Therapeutic activities;Therapeutic exercise;Manual techniques;Patient/family education;Passive range of motion;Dry needling;Joint Manipulations    PT Next Visit Plan DC    Consulted and Agree with Plan of Care Patient           Patient will benefit from skilled therapeutic intervention in order to improve the  following deficits and impairments:  Decreased activity tolerance  Visit Diagnosis: Pain in left hip  Stiffness of left hip, not elsewhere classified  Problem List Patient Active Problem List   Diagnosis Date Noted  . Jet lag syndrome 02/24/2020  . Chronic bilateral low back pain without sciatica 09/02/2019  . Piriformis syndrome, right 09/30/2018  . Essential hypertension 08/28/2018  . DDD (degenerative disc disease), cervical 07/29/2015  . Prediabetes 06/30/2013  . Erectile dysfunction 06/30/2013  . Routine general medical examination at a health care facility 04/12/2011  . ESOPHAGEAL STRICTURE 12/14/2008  . Gastritis and gastroduodenitis 12/14/2008  . BPH associated with nocturia 12/03/2008  . Osteoarthrosis, hand 12/03/2008  . Hyperlipidemia with target LDL less than 130 09/28/2008    Gabriela Eves, PT, DPT 12/09/2020, 10:39 AM  [facility] [place], [place], [address] Phone: [phone]   Fax:  [phone]  Name: Don Johnson MRN: [id number] Date of Birth: [birth year]

## 2020-12-29 ENCOUNTER — Telehealth: Payer: Self-pay | Admitting: Emergency Medicine

## 2020-12-29 NOTE — Telephone Encounter (Signed)
Pt is requesting to switch care from Dr Ronnald Ramp to Dr Sharlet Salina.

## 2020-12-29 NOTE — Telephone Encounter (Signed)
See below

## 2020-12-30 NOTE — Telephone Encounter (Signed)
See below

## 2020-12-30 NOTE — Telephone Encounter (Signed)
Yes, ok with me 

## 2021-01-04 NOTE — Telephone Encounter (Signed)
Fine, should still get care from PCP until switch

## 2021-01-22 ENCOUNTER — Other Ambulatory Visit: Payer: Self-pay | Admitting: Internal Medicine

## 2021-01-22 DIAGNOSIS — E785 Hyperlipidemia, unspecified: Secondary | ICD-10-CM

## 2021-04-04 ENCOUNTER — Other Ambulatory Visit: Payer: Self-pay | Admitting: Internal Medicine

## 2021-05-02 DIAGNOSIS — L237 Allergic contact dermatitis due to plants, except food: Secondary | ICD-10-CM | POA: Diagnosis not present

## 2021-06-22 ENCOUNTER — Other Ambulatory Visit: Payer: Self-pay

## 2021-06-22 ENCOUNTER — Ambulatory Visit (INDEPENDENT_AMBULATORY_CARE_PROVIDER_SITE_OTHER): Payer: Medicare Other

## 2021-06-22 DIAGNOSIS — Z23 Encounter for immunization: Secondary | ICD-10-CM

## 2021-07-12 ENCOUNTER — Other Ambulatory Visit: Payer: Self-pay | Admitting: Internal Medicine

## 2021-07-13 ENCOUNTER — Other Ambulatory Visit: Payer: Self-pay | Admitting: Internal Medicine

## 2021-07-20 DIAGNOSIS — M7741 Metatarsalgia, right foot: Secondary | ICD-10-CM | POA: Diagnosis not present

## 2021-07-20 DIAGNOSIS — S93124A Dislocation of metatarsophalangeal joint of right lesser toe(s), initial encounter: Secondary | ICD-10-CM | POA: Diagnosis not present

## 2021-07-20 DIAGNOSIS — M79671 Pain in right foot: Secondary | ICD-10-CM | POA: Diagnosis not present

## 2021-07-20 DIAGNOSIS — M79672 Pain in left foot: Secondary | ICD-10-CM | POA: Diagnosis not present

## 2021-07-28 DIAGNOSIS — U071 COVID-19: Secondary | ICD-10-CM | POA: Diagnosis not present

## 2021-07-28 DIAGNOSIS — Z23 Encounter for immunization: Secondary | ICD-10-CM | POA: Diagnosis not present

## 2021-07-29 DIAGNOSIS — H40013 Open angle with borderline findings, low risk, bilateral: Secondary | ICD-10-CM | POA: Diagnosis not present

## 2021-07-29 DIAGNOSIS — H2513 Age-related nuclear cataract, bilateral: Secondary | ICD-10-CM | POA: Diagnosis not present

## 2021-08-03 DIAGNOSIS — M79671 Pain in right foot: Secondary | ICD-10-CM | POA: Diagnosis not present

## 2021-08-10 DIAGNOSIS — M7741 Metatarsalgia, right foot: Secondary | ICD-10-CM | POA: Diagnosis not present

## 2021-08-10 DIAGNOSIS — M205X9 Other deformities of toe(s) (acquired), unspecified foot: Secondary | ICD-10-CM | POA: Diagnosis not present

## 2021-08-10 DIAGNOSIS — M6701 Short Achilles tendon (acquired), right ankle: Secondary | ICD-10-CM | POA: Diagnosis not present

## 2021-09-01 ENCOUNTER — Other Ambulatory Visit: Payer: Self-pay

## 2021-09-01 ENCOUNTER — Ambulatory Visit (INDEPENDENT_AMBULATORY_CARE_PROVIDER_SITE_OTHER): Payer: Medicare Other | Admitting: Internal Medicine

## 2021-09-01 ENCOUNTER — Encounter: Payer: Self-pay | Admitting: Internal Medicine

## 2021-09-01 VITALS — BP 146/86 | HR 55 | Temp 98.5°F | Resp 16 | Ht 65.5 in | Wt 139.0 lb

## 2021-09-01 DIAGNOSIS — R7303 Prediabetes: Secondary | ICD-10-CM | POA: Diagnosis not present

## 2021-09-01 DIAGNOSIS — R001 Bradycardia, unspecified: Secondary | ICD-10-CM | POA: Insufficient documentation

## 2021-09-01 DIAGNOSIS — Z23 Encounter for immunization: Secondary | ICD-10-CM

## 2021-09-01 DIAGNOSIS — I10 Essential (primary) hypertension: Secondary | ICD-10-CM | POA: Diagnosis not present

## 2021-09-01 LAB — CBC WITH DIFFERENTIAL/PLATELET
Basophils Absolute: 0.1 10*3/uL (ref 0.0–0.1)
Basophils Relative: 1 % (ref 0.0–3.0)
Eosinophils Absolute: 0.2 10*3/uL (ref 0.0–0.7)
Eosinophils Relative: 4.2 % (ref 0.0–5.0)
HCT: 42.7 % (ref 39.0–52.0)
Hemoglobin: 14 g/dL (ref 13.0–17.0)
Lymphocytes Relative: 31.7 % (ref 12.0–46.0)
Lymphs Abs: 1.8 10*3/uL (ref 0.7–4.0)
MCHC: 32.7 g/dL (ref 30.0–36.0)
MCV: 92.5 fl (ref 78.0–100.0)
Monocytes Absolute: 0.6 10*3/uL (ref 0.1–1.0)
Monocytes Relative: 11.1 % (ref 3.0–12.0)
Neutro Abs: 3 10*3/uL (ref 1.4–7.7)
Neutrophils Relative %: 52 % (ref 43.0–77.0)
Platelets: 217 10*3/uL (ref 150.0–400.0)
RBC: 4.62 Mil/uL (ref 4.22–5.81)
RDW: 13.5 % (ref 11.5–15.5)
WBC: 5.7 10*3/uL (ref 4.0–10.5)

## 2021-09-01 LAB — HEPATIC FUNCTION PANEL
ALT: 21 U/L (ref 0–53)
AST: 27 U/L (ref 0–37)
Albumin: 4.2 g/dL (ref 3.5–5.2)
Alkaline Phosphatase: 76 U/L (ref 39–117)
Bilirubin, Direct: 0.1 mg/dL (ref 0.0–0.3)
Total Bilirubin: 0.9 mg/dL (ref 0.2–1.2)
Total Protein: 6.6 g/dL (ref 6.0–8.3)

## 2021-09-01 LAB — BASIC METABOLIC PANEL
BUN: 20 mg/dL (ref 6–23)
CO2: 33 mEq/L — ABNORMAL HIGH (ref 19–32)
Calcium: 9.1 mg/dL (ref 8.4–10.5)
Chloride: 104 mEq/L (ref 96–112)
Creatinine, Ser: 0.9 mg/dL (ref 0.40–1.50)
GFR: 80.54 mL/min (ref >60.00)
Glucose, Bld: 87 mg/dL (ref 70–99)
Potassium: 4 mEq/L (ref 3.5–5.1)
Sodium: 141 mEq/L (ref 135–145)

## 2021-09-01 LAB — HEMOGLOBIN A1C: Hgb A1c MFr Bld: 6.3 % (ref 4.6–6.5)

## 2021-09-01 LAB — TSH: TSH: 2.15 u[IU]/mL (ref 0.35–5.50)

## 2021-09-01 MED ORDER — BOOSTRIX 5-2.5-18.5 LF-MCG/0.5 IM SUSP: 0.5000 mL | Freq: Once | INTRAMUSCULAR | 0 refills | Status: AC

## 2021-09-01 NOTE — Progress Notes (Signed)
Subjective:  Patient ID: Don Johnson, male    DOB: [birth year]  Age: 80 y.o. MRN: [id number]  CC: Hypertension  This visit occurred during the SARS-CoV-2 public health emergency.  Safety protocols were in place, including screening questions prior to the visit, additional usage of staff PPE, and extensive cleaning of exam room while observing appropriate contact time as indicated for disinfecting solutions.    HPI Don Johnson presents for f/up -  He is active and denies chest pain, shortness of breath, palpitations, near-syncope, dizziness, lightheadedness, diaphoresis, or edema.  Outpatient Medications Prior to Visit  Medication Sig Dispense Refill   atorvastatin (LIPITOR) 40 MG tablet Take 1 tablet (40 mg total) by mouth daily. 90 tablet 1   Multiple Vitamins-Minerals (CENTRUM SILVER PO) Take by mouth daily.     omeprazole (PRILOSEC) 40 MG capsule TAKE ONE CAPSULE BY MOUTH DAILY 90 capsule 0   No facility-administered medications prior to visit.    ROS Review of Systems  Constitutional:  Negative for diaphoresis, fatigue and unexpected weight change.  HENT: Negative.    Eyes: Negative.   Respiratory:  Negative for cough, chest tightness and shortness of breath.   Cardiovascular:  Negative for chest pain, palpitations and leg swelling.  Gastrointestinal:  Negative for abdominal pain, diarrhea, nausea and vomiting.  Endocrine: Negative.   Genitourinary: Negative.  Negative for difficulty urinating.  Musculoskeletal:  Negative for arthralgias and myalgias.  Skin: Negative.   Neurological:  Negative for dizziness, weakness and light-headedness.  Hematological:  Negative for adenopathy. Does not bruise/bleed easily.  Psychiatric/Behavioral: Negative.     Objective:  BP (!) 146/86 (BP Location: Left Arm, Patient Position: Sitting, Cuff Size: Large)    Pulse (!) 55    Temp 98.5 F (36.9 C) (Oral)    Resp 16    Ht 5' 5.5" (1.664 m)    Wt 139 lb (63 kg)    SpO2 99%    BMI  22.78 kg/m   BP Readings from Last 3 Encounters:  09/01/21 (!) 146/86  10/06/20 138/84  09/02/19 (!) 148/86    Wt Readings from Last 3 Encounters:  09/01/21 139 lb (63 kg)  10/06/20 132 lb (59.9 kg)  09/02/19 144 lb (65.3 kg)    Physical Exam Vitals reviewed.  HENT:     Nose: Nose normal.     Mouth/Throat:     Mouth: Mucous membranes are moist.  Eyes:     General: No scleral icterus.    Conjunctiva/sclera: Conjunctivae normal.  Cardiovascular:     Rate and Rhythm: Regular rhythm. Bradycardia present.     Heart sounds: Normal heart sounds, S1 normal and S2 normal. No murmur heard.   No friction rub. No gallop.     Comments: EKG- Sinus bradycardia with SA, 56 bpm Otherwise normal Pulmonary:     Effort: Pulmonary effort is normal.     Breath sounds: No stridor. No wheezing, rhonchi or rales.  Abdominal:     General: Abdomen is flat. Bowel sounds are normal. There is no distension.     Palpations: Abdomen is soft. There is no hepatomegaly, splenomegaly or mass.     Tenderness: There is no abdominal tenderness. There is no guarding.  Musculoskeletal:     Cervical back: Neck supple.     Right lower leg: No edema.     Left lower leg: No edema.  Lymphadenopathy:     Cervical: No cervical adenopathy.  Skin:    General: Skin is warm and dry.  Neurological:     General: No focal deficit present.  Psychiatric:        Mood and Affect: Mood normal.        Behavior: Behavior normal.    Lab Results  Component Value Date   WBC 5.7 09/01/2021   HGB 14.0 09/01/2021   HCT 42.7 09/01/2021   PLT 217.0 09/01/2021   GLUCOSE 87 09/01/2021   CHOL 138 10/06/2020   TRIG 44.0 10/06/2020   HDL 54.50 10/06/2020   LDLDIRECT 149.1 06/30/2013   LDLCALC 74 10/06/2020   ALT 21 09/01/2021   AST 27 09/01/2021   NA 141 09/01/2021   K 4.0 09/01/2021   CL 104 09/01/2021   CREATININE 0.90 09/01/2021   BUN 20 09/01/2021   CO2 33 (H) 09/01/2021   TSH 2.15 09/01/2021   PSA 0.57  09/02/2019   HGBA1C 6.3 09/01/2021    DG Lumbar Spine Complete  Result Date: 09/30/2018 CLINICAL DATA:  80 year old male with intermittent lower right back and right hip pain for the past year. No recent injury. Initial encounter. EXAM: LUMBAR SPINE - COMPLETE 4+ VIEW COMPARISON:  None. FINDINGS: Minimal curvature lumbar spine. No acute compression fracture. Minimal anterior wedging T12 may be congenital. No pars defect noted. L1-2 minimal disc space narrowing with osteophyte greater to right. L2-3 bilateral lateral osteophyte. L3-4 mild to moderate disc space narrowing greater on left with left lateral osteophyte. L4-5 moderate to marked disc space narrowing. Minimal retrolisthesis L4. Mild facet degenerative changes. L5-S1 mild to moderate disc space narrowing greatest posteriorly. Minimal facet degenerative changes. Moderate stool. IMPRESSION: Multilevel degenerative changes as detailed above most notable L4-5 level. Electronically Signed   By: Genia Del M.D.   On: 09/30/2018 15:24   [place] LIMITED JOINT SPACE STRUCTURES LOW RIGHT  Result Date: 10/06/2018 No images saved   DG HIP UNILAT WITH PELVIS 2-3 VIEWS RIGHT  Result Date: 09/30/2018 CLINICAL DATA:  80 year old male with intermittent lower right back and right hip pain for the past year. No recent injury. Initial encounter. EXAM: DG HIP (WITH OR WITHOUT PELVIS) 2-3V RIGHT COMPARISON:  Lumbar spine films same date dictated separately. FINDINGS: No significant hip joint space narrowing or surrounding degenerative changes. No fracture or dislocation. No plain film evidence of right femoral head avascular necrosis. Sacroiliac joints appear intact. Penile prosthesis noted. Degenerative changes lumbar spine as dictated on lumbar spine report. IMPRESSION: Negative plain film examination of the right hip. Electronically Signed   By: Genia Del M.D.   On: 09/30/2018 15:27    Assessment & Plan:   Cortney was seen today for  hypertension.  Diagnoses and all orders for this visit:  Essential hypertension- His blood pressure is adequately well controlled.  Electrolytes and renal function are normal. -     CBC with Differential/Platelet; Future -     Basic metabolic panel; Future -     TSH; Future -     Hepatic function panel; Future -     Hepatic function panel -     TSH -     Basic metabolic panel -     CBC with Differential/Platelet  Prediabetes- His A1c is at 6.3%.  Medical therapy is not indicated. -     Basic metabolic panel; Future -     Hemoglobin A1c; Future -     Hemoglobin A1c -     Basic metabolic panel  Bradycardia- His labs are negative for secondary causes.  He is asymptomatic with this.  No intervention is  needed. -     EKG 12-Lead  Need for prophylactic vaccination with combined diphtheria-tetanus-pertussis (DTP) vaccine -     Tdap (BOOSTRIX) 5-2.5-18.5 LF-MCG/0.5 injection; Inject 0.5 mLs into the muscle once for 1 dose.   I am having [name]. [place] start on Boostrix. I am also having him maintain his Multiple Vitamins-Minerals (CENTRUM SILVER PO), atorvastatin, and omeprazole.  Meds ordered this encounter  Medications   Tdap (BOOSTRIX) 5-2.5-18.5 LF-MCG/0.5 injection    Sig: Inject 0.5 mLs into the muscle once for 1 dose.    Dispense:  0.5 mL    Refill:  0     Follow-up: Return in about 6 months (around 03/02/2022).  Scarlette Calico, MD

## 2021-09-01 NOTE — Patient Instructions (Signed)
Bradycardia, Adult °Bradycardia is a slower-than-normal heartbeat. A normal resting heart rate for an adult ranges from 60 to 100 beats per minute. With bradycardia, the resting heart rate is less than 60 beats per minute. °Bradycardia can prevent enough oxygen from reaching certain areas of your body when you are active. It can be serious if it keeps enough oxygen from reaching your brain and other parts of your body. Bradycardia is not a problem for everyone. For some healthy adults, a slow resting heart rate is normal. °What are the causes? °This condition may be caused by: °A problem with the heart, including: °A problem with the heart's electrical system, such as a heart block. With a heart block, electrical signals between the chambers of the heart are partially or completely blocked, so they are not able to work as they should. °A problem with the heart's natural pacemaker (sinus node). °Heart disease. °A heart attack. °Heart damage. °Lyme disease. °A heart infection. °A heart condition that is present at birth (congenital heart defect). °Certain medicines that treat heart conditions. °Certain conditions, such as hypothyroidism and obstructive sleep apnea. °Problems with the balance of chemicals and other substances, like potassium, in the blood. °Trauma. °Radiation therapy. °What increases the risk? °You are more likely to develop this condition if you: °Are age 65 or older. °Have high blood pressure (hypertension), high cholesterol (hyperlipidemia), or diabetes. °Drink heavily, use tobacco or nicotine products, or use drugs. °What are the signs or symptoms? °Symptoms of this condition include: °Light-headedness. °Feeling faint or fainting. °Fatigue and weakness. °Trouble with activity or exercise. °Shortness of breath. °Chest pain (angina). °Drowsiness. °Confusion. °Dizziness. °How is this diagnosed? °This condition may be diagnosed based on: °Your symptoms. °Your medical history. °A physical exam. °During  the exam, your health care provider will listen to your heartbeat and check your pulse. To confirm the diagnosis, your health care provider may order tests, such as: °Blood tests. °An electrocardiogram (ECG). This test records the heart's electrical activity. The test can show how fast your heart is beating and whether the heartbeat is steady. °A test in which you wear a portable device (event recorder or Holter monitor) to record your heart's electrical activity while you go about your day. °An exercise test. °How is this treated? °Treatment for this condition depends on the cause of the condition and how severe your symptoms are. Treatment may involve: °Treatment of the underlying condition. °Changing your medicines or how much medicine you take. °Having a small, battery-operated device called a pacemaker implanted under the skin. When bradycardia occurs, this device can be used to increase your heart rate and help your heart beat in a regular rhythm. °Follow these instructions at home: °Lifestyle °Manage any health conditions that contribute to bradycardia as told by your health care provider. °Follow a heart-healthy diet. A nutrition specialist (dietitian) can help educate you about healthy food options and changes. °Follow an exercise program that is approved by your health care provider. °Maintain a healthy weight. °Try to reduce or manage your stress, such as with yoga or meditation. If you need help reducing stress, ask your health care provider. °Do not use any products that contain nicotine or tobacco. These products include cigarettes, chewing tobacco, and vaping devices, such as e-cigarettes. If you need help quitting, ask your health care provider. °Do not use illegal drugs. °Alcohol use °If you drink alcohol: °Limit how much you have to: °0-1 drink a day for women who are not pregnant. °0-2 drinks a day   for men. °Know how much alcohol is in a drink. In the [place] one drink equals one 12 oz bottle of  beer (355 mL), one 5 oz glass of wine (148 mL), or one 1½ oz glass of hard liquor (44 mL). °General instructions °Take over-the-counter and prescription medicines only as told by your health care provider. °Keep all follow-up visits. This is important. °How is this prevented? °In some cases, bradycardia may be prevented by: °Treating underlying medical problems. °Stopping behaviors or medicines that can trigger the condition. °Contact a health care provider if: °You feel light-headed or dizzy. °You almost faint. °You feel weak or are easily fatigued during physical activity. °You experience confusion or have memory problems. °Get help right away if: °You faint. °You have chest pains or an irregular heartbeat (palpitations). °You have trouble breathing. °These symptoms may represent a serious problem that is an emergency. Do not wait to see if the symptoms will go away. Get medical help right away. Call your local emergency services (911 in the [place] Do not drive yourself to the hospital. °Summary °Bradycardia is a slower-than-normal heartbeat. With bradycardia, the resting heart rate is less than 60 beats per minute. °Treatment for this condition depends on the cause. °Manage any health conditions that contribute to bradycardia as told by your health care provider. °Do not use any products that contain nicotine or tobacco. These products include cigarettes, chewing tobacco, and vaping devices, such as e-cigarettes. °Keep all follow-up visits. This is important. °This information is not intended to replace advice given to you by your health care provider. Make sure you discuss any questions you have with your health care provider. °Document Revised: 12/19/2020 Document Reviewed: 12/19/2020 °Elsevier Patient Education © 2022 [employer] ° °

## 2021-09-02 ENCOUNTER — Other Ambulatory Visit: Payer: Self-pay

## 2021-09-02 ENCOUNTER — Other Ambulatory Visit (HOSPITAL_COMMUNITY): Payer: Self-pay | Admitting: Orthopedic Surgery

## 2021-09-02 ENCOUNTER — Encounter (HOSPITAL_BASED_OUTPATIENT_CLINIC_OR_DEPARTMENT_OTHER): Payer: Self-pay | Admitting: Orthopedic Surgery

## 2021-09-06 ENCOUNTER — Other Ambulatory Visit: Payer: Self-pay | Admitting: Internal Medicine

## 2021-09-06 DIAGNOSIS — E785 Hyperlipidemia, unspecified: Secondary | ICD-10-CM

## 2021-09-08 NOTE — Progress Notes (Signed)

## 2021-09-15 ENCOUNTER — Ambulatory Visit (HOSPITAL_BASED_OUTPATIENT_CLINIC_OR_DEPARTMENT_OTHER)
Admission: RE | Admit: 2021-09-15 | Discharge: 2021-09-15 | Disposition: A | Discharge status: Home / Self Care | Payer: Medicare Other | Attending: Orthopedic Surgery | Admitting: Orthopedic Surgery

## 2021-09-15 ENCOUNTER — Other Ambulatory Visit: Payer: Self-pay

## 2021-09-15 ENCOUNTER — Ambulatory Visit (HOSPITAL_BASED_OUTPATIENT_CLINIC_OR_DEPARTMENT_OTHER): Payer: Medicare Other | Admitting: Certified Registered"

## 2021-09-15 ENCOUNTER — Ambulatory Visit (HOSPITAL_BASED_OUTPATIENT_CLINIC_OR_DEPARTMENT_OTHER): Payer: Medicare Other

## 2021-09-15 ENCOUNTER — Encounter (HOSPITAL_BASED_OUTPATIENT_CLINIC_OR_DEPARTMENT_OTHER)
Admission: RE | Disposition: A | Discharge status: Home / Self Care | Payer: Self-pay | Source: Home / Self Care | Attending: Orthopedic Surgery

## 2021-09-15 ENCOUNTER — Encounter (HOSPITAL_BASED_OUTPATIENT_CLINIC_OR_DEPARTMENT_OTHER): Payer: Self-pay | Admitting: Orthopedic Surgery

## 2021-09-15 DIAGNOSIS — X58XXXA Exposure to other specified factors, initial encounter: Secondary | ICD-10-CM | POA: Insufficient documentation

## 2021-09-15 DIAGNOSIS — M205X1 Other deformities of toe(s) (acquired), right foot: Secondary | ICD-10-CM | POA: Diagnosis not present

## 2021-09-15 DIAGNOSIS — Z79899 Other long term (current) drug therapy: Secondary | ICD-10-CM | POA: Diagnosis not present

## 2021-09-15 DIAGNOSIS — M2041 Other hammer toe(s) (acquired), right foot: Secondary | ICD-10-CM | POA: Insufficient documentation

## 2021-09-15 DIAGNOSIS — S93124A Dislocation of metatarsophalangeal joint of right lesser toe(s), initial encounter: Secondary | ICD-10-CM | POA: Insufficient documentation

## 2021-09-15 DIAGNOSIS — G8918 Other acute postprocedural pain: Secondary | ICD-10-CM | POA: Diagnosis not present

## 2021-09-15 DIAGNOSIS — M6701 Short Achilles tendon (acquired), right ankle: Secondary | ICD-10-CM | POA: Diagnosis not present

## 2021-09-15 DIAGNOSIS — M7741 Metatarsalgia, right foot: Secondary | ICD-10-CM | POA: Insufficient documentation

## 2021-09-15 DIAGNOSIS — M24574 Contracture, right foot: Secondary | ICD-10-CM | POA: Diagnosis not present

## 2021-09-15 DIAGNOSIS — K219 Gastro-esophageal reflux disease without esophagitis: Secondary | ICD-10-CM | POA: Insufficient documentation

## 2021-09-15 DIAGNOSIS — E785 Hyperlipidemia, unspecified: Secondary | ICD-10-CM | POA: Diagnosis not present

## 2021-09-15 DIAGNOSIS — M79671 Pain in right foot: Secondary | ICD-10-CM | POA: Diagnosis present

## 2021-09-15 HISTORY — PX: GASTROCNEMIUS RECESSION: SHX863

## 2021-09-15 HISTORY — PX: HAMMER TOE SURGERY: SHX385

## 2021-09-15 SURGERY — CORRECTION, HAMMER TOE
Anesthesia: General | Site: Toe | Laterality: Right

## 2021-09-15 MED ORDER — PHENYLEPHRINE HCL (PRESSORS) 10 MG/ML IV SOLN
INTRAVENOUS | Status: DC | PRN
Start: 2021-09-15 — End: 2021-09-15
  Administered 2021-09-15: 120 ug via INTRAVENOUS
  Administered 2021-09-15 (×3): 80 ug via INTRAVENOUS
  Administered 2021-09-15: 120 ug via INTRAVENOUS

## 2021-09-15 MED ORDER — SODIUM CHLORIDE 0.9 % IV SOLN: INTRAVENOUS | Status: DC

## 2021-09-15 MED ORDER — DEXAMETHASONE SODIUM PHOSPHATE 10 MG/ML IJ SOLN
INTRAMUSCULAR | Status: DC | PRN
Start: 2021-09-15 — End: 2021-09-15
  Administered 2021-09-15: 6 mg via INTRAVENOUS

## 2021-09-15 MED ORDER — GLYCOPYRROLATE 0.2 MG/ML IJ SOLN
INTRAMUSCULAR | Status: DC | PRN
  Administered 2021-09-15: .2 mg via INTRAVENOUS

## 2021-09-15 MED ORDER — CEFAZOLIN SODIUM-DEXTROSE 2-4 GM/100ML-% IV SOLN
2.0000 g | INTRAVENOUS | Status: AC
  Administered 2021-09-15: 2 g via INTRAVENOUS

## 2021-09-15 MED ORDER — BUPIVACAINE-EPINEPHRINE (PF) 0.5% -1:200000 IJ SOLN
INTRAMUSCULAR | Status: AC
  Filled 2021-09-15: qty 30

## 2021-09-15 MED ORDER — ONDANSETRON HCL 4 MG/2ML IJ SOLN: 4.0000 mg | Freq: Once | INTRAMUSCULAR | Status: DC | PRN

## 2021-09-15 MED ORDER — PHENYLEPHRINE HCL-NACL 20-0.9 MG/250ML-% IV SOLN
INTRAVENOUS | Status: DC | PRN
Start: 2021-09-15 — End: 2021-09-15
  Administered 2021-09-15: 40 ug/min via INTRAVENOUS

## 2021-09-15 MED ORDER — PHENYLEPHRINE HCL (PRESSORS) 10 MG/ML IV SOLN
INTRAVENOUS | Status: AC
  Filled 2021-09-15: qty 1

## 2021-09-15 MED ORDER — FENTANYL CITRATE (PF) 100 MCG/2ML IJ SOLN: 25.0000 ug | INTRAMUSCULAR | Status: DC | PRN

## 2021-09-15 MED ORDER — VANCOMYCIN HCL 500 MG IV SOLR
INTRAVENOUS | Status: DC | PRN
  Administered 2021-09-15: 500 mg via TOPICAL

## 2021-09-15 MED ORDER — LACTATED RINGERS IV SOLN: INTRAVENOUS | Status: DC

## 2021-09-15 MED ORDER — CEFAZOLIN SODIUM-DEXTROSE 2-4 GM/100ML-% IV SOLN
INTRAVENOUS | Status: AC
  Filled 2021-09-15: qty 100

## 2021-09-15 MED ORDER — PROPOFOL 500 MG/50ML IV EMUL
INTRAVENOUS | Status: DC | PRN
Start: 2021-09-15 — End: 2021-09-15
  Administered 2021-09-15: 25 ug/kg/min via INTRAVENOUS

## 2021-09-15 MED ORDER — VANCOMYCIN HCL 500 MG IV SOLR
INTRAVENOUS | Status: AC
  Filled 2021-09-15: qty 10

## 2021-09-15 MED ORDER — MIDAZOLAM HCL 2 MG/2ML IJ SOLN: 2.0000 mg | Freq: Once | INTRAMUSCULAR | Status: DC

## 2021-09-15 MED ORDER — PROPOFOL 10 MG/ML IV BOLUS
INTRAVENOUS | Status: DC | PRN
  Administered 2021-09-15: 50 mg via INTRAVENOUS
  Administered 2021-09-15: 200 mg via INTRAVENOUS

## 2021-09-15 MED ORDER — 0.9 % SODIUM CHLORIDE (POUR BTL) OPTIME
TOPICAL | Status: DC | PRN
  Administered 2021-09-15: 200 mL

## 2021-09-15 MED ORDER — ROPIVACAINE HCL 5 MG/ML IJ SOLN
INTRAMUSCULAR | Status: DC | PRN
  Administered 2021-09-15: 30 mL via PERINEURAL

## 2021-09-15 MED ORDER — MIDAZOLAM HCL 2 MG/2ML IJ SOLN
INTRAMUSCULAR | Status: AC
  Filled 2021-09-15: qty 2

## 2021-09-15 MED ORDER — ONDANSETRON HCL 4 MG/2ML IJ SOLN
INTRAMUSCULAR | Status: DC | PRN
  Administered 2021-09-15: 4 mg via INTRAVENOUS

## 2021-09-15 MED ORDER — FENTANYL CITRATE (PF) 100 MCG/2ML IJ SOLN
INTRAMUSCULAR | Status: AC
  Filled 2021-09-15: qty 2

## 2021-09-15 MED ORDER — LIDOCAINE 2% (20 MG/ML) 5 ML SYRINGE
INTRAMUSCULAR | Status: DC | PRN
Start: 2021-09-15 — End: 2021-09-15
  Administered 2021-09-15: 100 mg via INTRAVENOUS

## 2021-09-15 MED ORDER — ACETAMINOPHEN 10 MG/ML IV SOLN: 1000.0000 mg | Freq: Once | INTRAVENOUS | Status: DC | PRN

## 2021-09-15 MED ORDER — OXYCODONE HCL 5 MG PO TABS: 5.0000 mg | ORAL_TABLET | Freq: Four times a day (QID) | ORAL | 0 refills | Status: AC | PRN

## 2021-09-15 MED ORDER — SUCCINYLCHOLINE CHLORIDE 200 MG/10ML IV SOSY
PREFILLED_SYRINGE | INTRAVENOUS | Status: DC | PRN
Start: 2021-09-15 — End: 2021-09-15
  Administered 2021-09-15: 60 mg via INTRAVENOUS

## 2021-09-15 MED ORDER — FENTANYL CITRATE (PF) 100 MCG/2ML IJ SOLN
100.0000 ug | Freq: Once | INTRAMUSCULAR | Status: AC
  Administered 2021-09-15: 75 ug via INTRAVENOUS

## 2021-09-15 SURGICAL SUPPLY — 75 items
APL PRP STRL LF DISP 70% ISPRP (MISCELLANEOUS) ×2
BANDAGE ESMARK 6X9 LF (GAUZE/BANDAGES/DRESSINGS) IMPLANT
BLADE ARTHRO LOK 4 BEAVER (BLADE) ×1 IMPLANT
BLADE AVERAGE 25X9 (BLADE) IMPLANT
BLADE LONG MED 25X9 (BLADE) ×1 IMPLANT
BLADE OSC/SAG .038X5.5 CUT EDG (BLADE) IMPLANT
BLADE SURG 15 STRL LF DISP TIS (BLADE) ×6 IMPLANT
BLADE SURG 15 STRL SS (BLADE) ×6
BNDG CMPR 9X4 STRL LF SNTH (GAUZE/BANDAGES/DRESSINGS)
BNDG CMPR 9X6 STRL LF SNTH (GAUZE/BANDAGES/DRESSINGS)
BNDG COHESIVE 4X5 TAN ST LF (GAUZE/BANDAGES/DRESSINGS) IMPLANT
BNDG COHESIVE 6X5 TAN ST LF (GAUZE/BANDAGES/DRESSINGS) IMPLANT
BNDG CONFORM 2 STRL LF (GAUZE/BANDAGES/DRESSINGS) IMPLANT
BNDG CONFORM 3 STRL LF (GAUZE/BANDAGES/DRESSINGS) ×4 IMPLANT
BNDG ELASTIC 4X5.8 VLCR STR LF (GAUZE/BANDAGES/DRESSINGS) ×1 IMPLANT
BNDG ESMARK 4X9 LF (GAUZE/BANDAGES/DRESSINGS) IMPLANT
BNDG ESMARK 6X9 LF (GAUZE/BANDAGES/DRESSINGS)
BOOT STEPPER DURA LG (SOFTGOODS) IMPLANT
BOOT STEPPER DURA MED (SOFTGOODS) ×1 IMPLANT
BOOT STEPPER DURA XLG (SOFTGOODS) IMPLANT
CHLORAPREP W/TINT 26 (MISCELLANEOUS) ×4 IMPLANT
COVER BACK TABLE 60X90IN (DRAPES) ×4 IMPLANT
CUFF TOURN SGL QUICK 34 (TOURNIQUET CUFF)
CUFF TRNQT CYL 34X4.125X (TOURNIQUET CUFF) IMPLANT
DRAPE EXTREMITY T 121X128X90 (DISPOSABLE) ×4 IMPLANT
DRAPE OEC MINIVIEW 54X84 (DRAPES) ×4 IMPLANT
DRAPE U-SHAPE 47X51 STRL (DRAPES) ×4 IMPLANT
DRSG MEPITEL 4X7.2 (GAUZE/BANDAGES/DRESSINGS) ×4 IMPLANT
DRSG PAD ABDOMINAL 8X10 ST (GAUZE/BANDAGES/DRESSINGS) ×4 IMPLANT
ELECT REM PT RETURN 9FT ADLT (ELECTROSURGICAL) ×3
ELECTRODE REM PT RTRN 9FT ADLT (ELECTROSURGICAL) ×3 IMPLANT
GAUZE SPONGE 4X4 12PLY STRL (GAUZE/BANDAGES/DRESSINGS) ×4 IMPLANT
GLOVE SRG 8 PF TXTR STRL LF DI (GLOVE) ×6 IMPLANT
GLOVE SURG ENC MOIS LTX SZ8 (GLOVE) ×4 IMPLANT
GLOVE SURG LTX SZ8 (GLOVE) ×3 IMPLANT
GLOVE SURG POLYISO LF SZ6.5 (GLOVE) ×1 IMPLANT
GLOVE SURG POLYISO LF SZ7 (GLOVE) ×1 IMPLANT
GLOVE SURG UNDER POLY LF SZ7 (GLOVE) ×3 IMPLANT
GLOVE SURG UNDER POLY LF SZ8 (GLOVE) ×3
GOWN STRL REUS W/ TWL LRG LVL3 (GOWN DISPOSABLE) ×3 IMPLANT
GOWN STRL REUS W/ TWL XL LVL3 (GOWN DISPOSABLE) ×6 IMPLANT
GOWN STRL REUS W/TWL LRG LVL3 (GOWN DISPOSABLE) ×6
GOWN STRL REUS W/TWL XL LVL3 (GOWN DISPOSABLE) ×3
K-WIRE .054X4 (WIRE) IMPLANT
K-WIRE DOUBLE 9.062 TROCAR (WIRE) ×3 IMPLANT
NDL HYPO 25X1 1.5 SAFETY (NEEDLE) IMPLANT
NEEDLE HYPO 22GX1.5 SAFETY (NEEDLE) IMPLANT
NEEDLE HYPO 25X1 1.5 SAFETY (NEEDLE) IMPLANT
NS IRRIG 1000ML POUR BTL (IV SOLUTION) ×4 IMPLANT
PACK BASIN DAY SURGERY FS (CUSTOM PROCEDURE TRAY) ×4 IMPLANT
PAD CAST 4YDX4 CTTN HI CHSV (CAST SUPPLIES) ×3 IMPLANT
PADDING CAST ABS 4INX4YD NS (CAST SUPPLIES)
PADDING CAST ABS COTTON 4X4 ST (CAST SUPPLIES) IMPLANT
PADDING CAST COTTON 4X4 STRL (CAST SUPPLIES) ×3
PADDING CAST COTTON 6X4 STRL (CAST SUPPLIES) IMPLANT
PENCIL SMOKE EVACUATOR (MISCELLANEOUS) ×4 IMPLANT
SANITIZER HAND PURELL 535ML FO (MISCELLANEOUS) ×4 IMPLANT
SHEET MEDIUM DRAPE 40X70 STRL (DRAPES) ×4 IMPLANT
SLEEVE SCD COMPRESS KNEE MED (STOCKING) ×4 IMPLANT
SPLINT FAST PLASTER 5X30 (CAST SUPPLIES)
SPLINT PLASTER CAST FAST 5X30 (CAST SUPPLIES) IMPLANT
SPONGE T-LAP 18X18 ~~LOC~~+RFID (SPONGE) ×4 IMPLANT
STOCKINETTE 6  STRL (DRAPES) ×3
STOCKINETTE 6 STRL (DRAPES) ×3 IMPLANT
SUCTION FRAZIER HANDLE 10FR (MISCELLANEOUS) ×3
SUCTION TUBE FRAZIER 10FR DISP (MISCELLANEOUS) IMPLANT
SUT ETHILON 3 0 PS 1 (SUTURE) ×5 IMPLANT
SUT MNCRL AB 3-0 PS2 18 (SUTURE) ×4 IMPLANT
SUT VICRYL 0 SH 27 (SUTURE) IMPLANT
SYR BULB EAR ULCER 3OZ GRN STR (SYRINGE) ×4 IMPLANT
SYR CONTROL 10ML LL (SYRINGE) IMPLANT
TOWEL GREEN STERILE FF (TOWEL DISPOSABLE) ×4 IMPLANT
TUBE CONNECTING 20X1/4 (TUBING) IMPLANT
UNDERPAD 30X36 HEAVY ABSORB (UNDERPADS AND DIAPERS) ×4 IMPLANT
YANKAUER SUCT BULB TIP NO VENT (SUCTIONS) IMPLANT

## 2021-09-15 NOTE — Transfer of Care (Signed)
Immediate Anesthesia Transfer of Care Note  Patient: Don Johnson  Procedure(s) Performed: Right 2-4 metatarsal head resection; hammertoe correction (Right: Toe) Gastroc recession (Right: Leg Lower)  Patient Location: PACU  Anesthesia Type:General and Regional  Level of Consciousness: drowsy  Airway & Oxygen Therapy: Patient Spontanous Breathing and Patient connected to face mask oxygen  Post-op Assessment: Report given to RN and Post -op Vital signs reviewed and stable  Post vital signs: Reviewed and stable  Last Vitals:  Vitals Value Taken Time  BP    Temp    Pulse 63 09/15/21 1338  Resp 14 09/15/21 1338  SpO2 99 % 09/15/21 1338  Vitals shown include unvalidated device data.  Last Pain:  Vitals:   09/15/21 0931  TempSrc: Oral  PainSc: 0-No pain         Complications: No notable events documented.

## 2021-09-15 NOTE — Anesthesia Preprocedure Evaluation (Signed)
Anesthesia Evaluation  Patient identified by MRN, date of birth, ID band Patient awake    Reviewed: Allergy & Precautions, NPO status , Patient's Chart, lab work & pertinent test results  Airway Mallampati: II  TM Distance: >3 FB Neck ROM: Full    Dental  (+) Upper Dentures, Partial Lower   Pulmonary neg pulmonary ROS,    Pulmonary exam normal breath sounds clear to auscultation       Cardiovascular hypertension, Normal cardiovascular exam Rhythm:Regular Rate:Normal  untreated   Neuro/Psych negative neurological ROS  negative psych ROS   GI/Hepatic Neg liver ROS, GERD  Medicated,  Endo/Other  negative endocrine ROS  Renal/GU negative Renal ROS  negative genitourinary   Musculoskeletal  (+) Arthritis , Osteoarthritis,    Abdominal   Peds negative pediatric ROS (+)  Hematology negative hematology ROS (+)   Anesthesia Other Findings   Reproductive/Obstetrics negative OB ROS                             Anesthesia Physical Anesthesia Plan  ASA: 3  Anesthesia Plan: General   Post-op Pain Management: Regional block   Induction: Intravenous  PONV Risk Score and Plan: 2 and Ondansetron, Dexamethasone and Treatment may vary due to age or medical condition  Airway Management Planned: LMA  Additional Equipment:   Intra-op Plan:   Post-operative Plan: Extubation in OR  Informed Consent: I have reviewed the patients History and Physical, chart, labs and discussed the procedure including the risks, benefits and alternatives for the proposed anesthesia with the patient or authorized representative who has indicated his/her understanding and acceptance.     Dental advisory given  Plan Discussed with: CRNA and Surgeon  Anesthesia Plan Comments:         Anesthesia Quick Evaluation

## 2021-09-15 NOTE — Anesthesia Procedure Notes (Signed)
Anesthesia Procedure Image    

## 2021-09-15 NOTE — Op Note (Signed)
09/15/2021  1:39 PM  PATIENT:  Don Johnson  81 y.o. male  PRE-OPERATIVE DIAGNOSIS:  1.  acquired short right Achilles tendon      2.  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  Right 4th and 5th hammertoes      4.  Right metatarsalgia  POST-OPERATIVE DIAGNOSIS:  same  Procedure(s): 1.  Right gastrocnemius recession 2.  Extensor tendon lengthening of the right second, third, fourth and fifth toes 3.  Right second, third and fourth metatarsal head excisions through a plantar approach 4.  Right second, third and fourth hammertoe corrections 5 right second and third MTP joint open reduction 6.  Right foot AP, lateral and oblique radiographs  SURGEON:  Wylene Simmer, MD  ASSISTANT: None  ANESTHESIA:   General, regional  EBL:  minimal   TOURNIQUET:   Total Tourniquet Time Documented: Thigh (Right) - 46 minutes Total: Thigh (Right) - 46 minutes  COMPLICATIONS:  None apparent  DISPOSITION:  Extubated, awake and stable to recovery.  INDICATION FOR PROCEDURE: The patient is an 81 year old male without significant past medical history.  He has chronic right forefoot pain due to metatarsalgia and a tight heel cord as well as second and third claw toe deformities and fourth and fifth hammertoes.  He has failed nonoperative treatment with activity modification, oral anti-inflammatories and shoewear modification.  He presents now for surgical correction of these painful right forefoot deformities.  The risks and benefits of the alternative treatment options have been discussed in detail.  The patient wishes to proceed with surgery and specifically understands risks of bleeding, infection, nerve damage, blood clots, need for additional surgery, amputation and death.   PROCEDURE IN DETAIL:  After pre operative consent was obtained, and the correct operative site was identified, the patient was brought to the operating room and placed supine on the OR table.   Anesthesia was administered.  Pre-operative antibiotics were administered.  A surgical timeout was taken.  The right lower extremity was prepped and draped in standard sterile fashion with the tourniquet around the thigh.  The extremity was elevated and the tourniquet was inflated to 300 mmHg.  The patient's dorsal extensor tendons to the second through fifth toes were all noted to be quite tight.  A Beaver blade was inserted adjacent to the extensor tendons of the second toe.  The extensor digitorum longus and brevis tendons were both released.  The same procedure was performed for the third, fourth and fifth toes through separate incisions.  Attention was then turned to the plantar aspect of the forefoot.  An ellipsoid incision was made at the plantar aspect of the foot beneath the second through fourth metatarsal heads.  A flap of skin was resected to allow for better closure.  Dissection was then carried bluntly down through the plantar bursal tissue to the second metatarsal head.  The flexor tendons were identified.  The metatarsal head was exposed.  Retractors were placed around the metatarsal neck protecting the soft tissues medially and laterally.  The oscillating saw was used to cut through the neck of the metatarsal.  The head of the metatarsal was removed with a Research scientist (life sciences).  The same procedure was then performed for the third and fourth metatarsals.  Attention was turned to the dorsum of the second toe where a transverse incision was made over the PIP joint.  Dissection was carried down through the subcutaneous tissues.  An oscillating saw  was used to resect the head of the proximal phalanx followed by the base of the middle phalanx.  The joint was then reduced.  A 0.0625 K wire was advanced across the DIP joint and a cut out through the tip of the toe.  The PIP joint was reduced and the wire advanced across the joint to the base of the proximal phalanx.  The second MTP joint was reduced and  the wire advanced into the second metatarsal shaft under fluoroscopic guidance.  The wire was advanced to the base of the metatarsal where it engaged and subchondral bone.  AP, lateral and oblique radiographs confirmed appropriate reduction of the IP and MP joints in appropriate position and length of the wire.  The wire was then bent, trimmed and capped.  The same procedure was then performed for the third and fourth toes taking care to reduce the dislocated third MTP joint appropriately.  The tourniquet was then released.  Hemostasis was achieved.  Final AP, lateral and oblique radiographs showed appropriate reduction of the second, third and fourth MTP joints and the second, third and fourth toe IP joints.  The wounds were irrigated copiously and sprinkled with vancomycin powder.  The skin incisions were closed with horizontal mattress sutures of 3-0 nylon.  Sterile dressings were applied followed by a compression wrap and a cam boot.  The toes were appropriately perfused after application of the dressings.  The patient was awakened from anesthesia and transported to the recovery room in stable condition.  FOLLOW UP PLAN: Weightbearing as tolerated in the cam boot.  Follow-up in the office in 2 weeks for suture removal.  No indication for DVT prophylaxis in this ambulatory patient.   RADIOGRAPHS: AP, lateral and oblique radiographs of the right foot are obtained intraoperatively.  These show interval resection of the second, third and fourth metatarsal heads and correction of the second and third claw toes as well as the fourth and fifth hammertoes.  Hardware is appropriately positioned and of the appropriate lengths.  No other acute injuries are noted.

## 2021-09-15 NOTE — Discharge Instructions (Addendum)
Don Simmer, MD [facility]  Please read the following information regarding your care after surgery.  Medications  You only need a prescription for the narcotic pain medicine (ex. oxycodone, Percocet, Norco).  All of the other medicines listed below are available over the counter. X Aleve 2 pills twice a day for the first 3 days after surgery. X acetominophen (Tylenol) 650 mg every 4-6 hours as you need for minor to moderate pain X oxycodone as prescribed for severe pain  Narcotic pain medicine (ex. oxycodone, Percocet, Vicodin) will cause constipation.  To prevent this problem, take the following medicines while you are taking any pain medicine. X docusate sodium (Colace) 100 mg twice a day X senna (Senokot) 2 tablets twice a day  X To help prevent blood clots, take a baby aspirin (81 mg) twice a day for two weeks after surgery.  You should also get up every hour while you are awake to move around.    Weight Bearing X Bear weight when you are able on your operated leg or foot in the CAM boot.  Cast / Splint / Dressing X Keep your splint, cast or dressing clean and dry.  Dont put anything (coat hanger, pencil, etc) down inside of it.  If it gets damp, use a hair dryer on the cool setting to dry it.  If it gets soaked, call the office to schedule an appointment for a cast change.    After your dressing, cast or splint is removed; you may shower, but do not soak or scrub the wound.  Allow the water to run over it, and then gently pat it dry.  Swelling It is normal for you to have swelling where you had surgery.  To reduce swelling and pain, keep your toes above your nose for at least 3 days after surgery.  It may be necessary to keep your foot or leg elevated for several weeks.  If it hurts, it should be elevated.  Follow Up Call my office at [phone] when you are discharged from the hospital or surgery center to schedule an appointment to be seen two weeks after surgery.  Call my  office at [phone] if you develop a fever >101.5 F, nausea, vomiting, bleeding from the surgical site or severe pain.    Regional Anesthesia Blocks  1. Numbness or the inability to move the "blocked" extremity may last from 3-48 hours after placement. The length of time depends on the medication injected and your individual response to the medication. If the numbness is not going away after 48 hours, call your surgeon.  2. The extremity that is blocked will need to be protected until the numbness is gone and the  Strength has returned. Because you cannot feel it, you will need to take extra care to avoid injury. Because it may be weak, you may have difficulty moving it or using it. You may not know what position it is in without looking at it while the block is in effect.  3. For blocks in the legs and feet, returning to weight bearing and walking needs to be done carefully. You will need to wait until the numbness is entirely gone and the strength has returned. You should be able to move your leg and foot normally before you try and bear weight or walk. You will need someone to be with you when you first try to ensure you do not fall and possibly risk injury.  4. Bruising and tenderness at the needle site are  common side effects and will resolve in a few days.  5. Persistent numbness or new problems with movement should be communicated to the surgeon or the Center [phone] [place] [phone]).        Post Anesthesia Home Care Instructions  Activity: Get plenty of rest for the remainder of the day. A responsible individual must stay with you for 24 hours following the procedure.  For the next 24 hours, DO NOT: -Drive a car -Paediatric nurse -Drink alcoholic beverages -Take any medication unless instructed by your physician -Make any legal decisions or sign important papers.  Meals: Start with liquid foods such as gelatin or soup.  Progress to regular foods as tolerated. Avoid greasy, spicy, heavy foods. If nausea and/or vomiting occur, drink only clear liquids until the nausea and/or vomiting subsides. Call your physician if vomiting continues.  Special Instructions/Symptoms: Your throat may feel dry or sore from the anesthesia or the breathing tube placed in your throat during surgery. If this causes discomfort, gargle with warm salt water. The discomfort should disappear within 24 hours.  If you had a scopolamine patch placed behind your ear for the management of post- operative nausea and/or vomiting:  1. The medication in the patch is effective for 72 hours, after which it should be removed.  Wrap patch in a tissue and discard in the trash. Wash hands thoroughly with soap and water. 2. You may remove the patch earlier than 72 hours if you experience unpleasant side effects which may include dry mouth, dizziness or visual disturbances. 3. Avoid touching the patch. Wash your hands with soap and water after contact with the patch.

## 2021-09-15 NOTE — H&P (Signed)
Don Johnson is an 81 y.o. male.   Chief Complaint: Right foot pain HPI: 81 year old male without significant past medical history complains of a long history of right forefoot pain.  He has struggled with shoewear and notes pain at the central forefoot.  He reports progression of hammertoe deformities over many years.  He has failed nonoperative treatment to date including activity modification, oral anti-inflammatories and shoewear modification.  He presents now for surgical treatment of these painful forefoot deformities.  Past Medical History:  Diagnosis Date   Bilateral recurrent inguinal hernia    BPH with obstruction/lower urinary tract symptoms    Cough 09/20/2016   DDD (degenerative disc disease), cervical    Diverticulosis of colon    GERD (gastroesophageal reflux disease)    Hiatal hernia    History of adenomatous polyp of colon    2006 tubular adenoma;  2010 tubular adenoma and hyperplastic   Hyperlipidemia    OA (osteoarthritis)    hands   Organic sexual dysfunction    Pre-diabetes    S/P dilatation of esophageal stricture    multiple times --  last time w/ balloon 12-31-2015   Wears dentures    upper denture, lower partial   Wears glasses     Past Surgical History:  Procedure Laterality Date   COLONOSCOPY  last one 03-14-2011   ESOPHAGOGASTRODUODENOSCOPY (EGD) WITH ESOPHAGEAL DILATION  last one 12-31-2015   HEMORROIDECTOMY  1970's   INGUINAL HERNIA REPAIR Bilateral 1980's   PENILE PROSTHESIS IMPLANT N/A 09/29/2016   Procedure: PENILE PROTHESIS INFLATABLE;  Surgeon: Carolan Clines, MD;  Location: [place];  Service: Urology;  Laterality: N/A;   UMBILICAL HERNIA REPAIR  2007 approx    Family History  Problem Relation Age of Onset   Diabetes Mother    Colon polyps Mother    Arthritis Mother    Hypertension Mother    Hyperlipidemia Mother    Diabetes Sister    Leukemia Brother    COPD Brother    Pancreatic cancer Brother    Colon  cancer Neg Hx    Stomach cancer Neg Hx    Social History:  reports that he has never smoked. He has never used smokeless tobacco. He reports that he does not drink alcohol and does not use drugs.  Allergies: No Known Allergies  Medications Prior to Admission  Medication Sig Dispense Refill   atorvastatin (LIPITOR) 40 MG tablet Take 1 tablet (40 mg total) by mouth daily. 90 tablet 1   Multiple Vitamins-Minerals (CENTRUM SILVER PO) Take by mouth daily.     omeprazole (PRILOSEC) 40 MG capsule TAKE ONE CAPSULE BY MOUTH DAILY 90 capsule 0    No results found for this or any previous visit (from the past 48 hour(s)). No results found.  Review of Systems no recent fever, chills, nausea, vomiting or changes in his appetite  Blood pressure (!) 186/79, pulse (!) 59, temperature (!) 97.2 F (36.2 C), temperature source Oral, resp. rate 18, height 5' 5.5" (1.664 m), weight 60.7 kg, SpO2 97 %. Physical Exam  Well-nourished well-developed man in no apparent distress.  Alert and oriented x4.  Normal mood and affect.  Gait is normal.  The right forefoot has significant plantar bursitis.  He has severe second through fourth hammertoe deformities.  Skin is healthy and intact.  Pulses are palpable.  No lymphadenopathy.  Hammertoe deformities are not passively correctable.  Heel cord is tight.   Assessment/Plan Right central metatarsalgia, second through fourth hammertoes and  short Achilles tendon -to the operating room today for gastrocnemius recession, second through fourth metatarsal head excision and hammertoe corrections.  The risks and benefits of the alternative treatment options have been discussed in detail.  The patient wishes to proceed with surgery and specifically understands risks of bleeding, infection, nerve damage, blood clots, need for additional surgery, amputation and death.   Wylene Simmer, MD 09/30/21, 11:31 AM

## 2021-09-15 NOTE — Anesthesia Postprocedure Evaluation (Signed)
Anesthesia Post Note  Patient: Don Johnson  Procedure(s) Performed: Right 2-4 metatarsal head resection; hammertoe correction (Right: Toe) Gastroc recession (Right: Leg Lower)     Patient location during evaluation: PACU Anesthesia Type: General Level of consciousness: awake and alert Pain management: pain level controlled Vital Signs Assessment: post-procedure vital signs reviewed and stable Respiratory status: spontaneous breathing, nonlabored ventilation, respiratory function stable and patient connected to nasal cannula oxygen Cardiovascular status: blood pressure returned to baseline and stable Postop Assessment: no apparent nausea or vomiting Anesthetic complications: no   No notable events documented.  Last Vitals:  Vitals:   09/15/21 1400 09/15/21 1415  BP: (!) 159/86 (!) 171/95  Pulse: 66 66  Resp: (!) 22 18  Temp:    SpO2: 97% 98%    Last Pain:  Vitals:   09/15/21 1400  TempSrc:   PainSc: 0-No pain                 Tameyah Koch S

## 2021-09-15 NOTE — Progress Notes (Signed)
Assisted Dr. Kalman Shan with right, ultrasound guided, popliteal block. Side rails up, monitors on throughout procedure. See vital signs in flow sheet. Tolerated Procedure well.

## 2021-09-15 NOTE — Anesthesia Procedure Notes (Signed)
Procedure Name: LMA Insertion Date/Time: 09/15/2021 12:15 PM Performed by: Ezequiel Kayser, CRNA Pre-anesthesia Checklist: Patient identified, Emergency Drugs available, Suction available and Patient being monitored Patient Re-evaluated:Patient Re-evaluated prior to induction Oxygen Delivery Method: Circle System Utilized Preoxygenation: Pre-oxygenation with 100% oxygen Induction Type: IV induction Ventilation: Mask ventilation without difficulty LMA: LMA inserted LMA Size: 4.0 Number of attempts: 1 Airway Equipment and Method: Bite block Placement Confirmation: positive ETCO2 Tube secured with: Tape Dental Injury: Teeth and Oropharynx as per pre-operative assessment

## 2021-09-15 NOTE — Anesthesia Procedure Notes (Signed)
Anesthesia Regional Block: Popliteal block   Pre-Anesthetic Checklist: , timeout performed,  Correct Patient, Correct Site, Correct Laterality,  Correct Procedure, Correct Position, site marked,  Risks and benefits discussed,  Surgical consent,  Pre-op evaluation,  At surgeon's request and post-op pain management  Laterality: Right  Prep: chloraprep       Needles:  Injection technique: Single-shot  Needle Type: Echogenic Needle     Needle Length: 9cm      Additional Needles:   Procedures:,,,, ultrasound used (permanent image in chart),,    Narrative:  Start time: 09/15/2021 11:25 AM End time: 09/15/2021 11:32 AM Injection made incrementally with aspirations every 5 mL.  Performed by: Personally  Anesthesiologist: Myrtie Soman, MD  Additional Notes: Patient tolerated the procedure well without complications

## 2021-09-16 ENCOUNTER — Encounter (HOSPITAL_BASED_OUTPATIENT_CLINIC_OR_DEPARTMENT_OTHER): Payer: Self-pay | Admitting: Orthopedic Surgery

## 2021-10-24 ENCOUNTER — Other Ambulatory Visit: Payer: Self-pay | Admitting: Internal Medicine

## 2021-10-31 DIAGNOSIS — Z4889 Encounter for other specified surgical aftercare: Secondary | ICD-10-CM | POA: Diagnosis not present

## 2021-10-31 DIAGNOSIS — M79671 Pain in right foot: Secondary | ICD-10-CM | POA: Diagnosis not present

## 2021-10-31 DIAGNOSIS — M205X9 Other deformities of toe(s) (acquired), unspecified foot: Secondary | ICD-10-CM | POA: Diagnosis not present

## 2021-11-30 DIAGNOSIS — Z4889 Encounter for other specified surgical aftercare: Secondary | ICD-10-CM | POA: Diagnosis not present

## 2021-11-30 DIAGNOSIS — M205X1 Other deformities of toe(s) (acquired), right foot: Secondary | ICD-10-CM | POA: Diagnosis not present

## 2022-02-17 ENCOUNTER — Ambulatory Visit (INDEPENDENT_AMBULATORY_CARE_PROVIDER_SITE_OTHER): Payer: Medicare Other | Admitting: Internal Medicine

## 2022-02-17 ENCOUNTER — Encounter: Payer: Self-pay | Admitting: Internal Medicine

## 2022-02-17 VITALS — BP 110/70 | HR 50 | Ht 65.5 in | Wt 135.6 lb

## 2022-02-17 DIAGNOSIS — K219 Gastro-esophageal reflux disease without esophagitis: Secondary | ICD-10-CM

## 2022-02-17 DIAGNOSIS — K439 Ventral hernia without obstruction or gangrene: Secondary | ICD-10-CM

## 2022-02-17 MED ORDER — OMEPRAZOLE 40 MG PO CPDR: 40.0000 mg | DELAYED_RELEASE_CAPSULE | Freq: Every day | ORAL | 3 refills | Status: DC

## 2022-02-17 NOTE — Patient Instructions (Addendum)
If you are age 81 or older, your body mass index should be between 23-30. Your Body mass index is 22.22 kg/m. If this is out of the aforementioned range listed, please consider follow up with your Primary Care Provider.  If you are age 63 or younger, your body mass index should be between 19-25. Your Body mass index is 22.22 kg/m. If this is out of the aformentioned range listed, please consider follow up with your Primary Care Provider.   ________________________________________________________  The [facility] providers would like to encourage you to use [facility] to communicate with providers for non-urgent requests or questions.  Due to long hold times on the telephone, sending your provider a message by [facility] may be a faster and more efficient way to get a response.  Please allow 48 business hours for a response.  Please remember that this is for non-urgent requests.  _______________________________________________________  We have sent the following medications to your pharmacy for you to pick up at your convenience:  Omeprazole  Please follow up in 2 years

## 2022-02-17 NOTE — Progress Notes (Signed)
HISTORY OF PRESENT ILLNESS:  Don Johnson is a 81 y.o. male with a history of GERD complicated by peptic stricture requiring esophageal dilation, and adenomatous colon polyps.  He presents today for routine follow-up regarding management of his chronic GERD.  He also has questions regarding his ventral hernia.  Patient was last seen in this office February 2020.  At that time he was experiencing problems with diarrhea, felt to be infectious.  Terms of his GERD, he was asymptomatic on PPI.  Patient tells me that he has continued on omeprazole 40 mg daily.  He tells me that he has been taking this every other day without reflux symptoms.  No recurrent dysphagia.  His last upper endoscopy with esophageal dilation was May 2010 with Dr. Sharlett Iles.  Next, patient tells me that he has had prior umbilical hernia repair.  However, he has a small hernia above this that has had for many many years.  Occasionally it gets uncomfortable, particularly after large meals.  He asked for evaluation.  Does have a history of adenomatous colon polyps with his last colonoscopy 2018.  No future surveillance recommended.  He denies any lower GI complaints.  Review of outside blood work from December 2022 shows normal CBC with hemoglobin 14.0.  Normal comprehensive metabolic panel.  Hemoglobin A1c 6.3.  REVIEW OF SYSTEMS:  All non-GI ROS negative unless otherwise stated in the HPI except for allergies  Past Medical History:  Diagnosis Date   Bilateral recurrent inguinal hernia    BPH with obstruction/lower urinary tract symptoms    Cough 09/20/2016   DDD (degenerative disc disease), cervical    Diverticulosis of colon    GERD (gastroesophageal reflux disease)    Hiatal hernia    History of adenomatous polyp of colon    2006 tubular adenoma;  2010 tubular adenoma and hyperplastic   Hyperlipidemia    OA (osteoarthritis)    hands   Organic sexual dysfunction    Pre-diabetes    S/P dilatation of esophageal stricture     multiple times --  last time w/ balloon 12-31-2015   Wears dentures    upper denture, lower partial   Wears glasses     Past Surgical History:  Procedure Laterality Date   COLONOSCOPY  last one 03-14-2011   ESOPHAGOGASTRODUODENOSCOPY (EGD) WITH ESOPHAGEAL DILATION  last one 12-31-2015   GASTROCNEMIUS RECESSION Right 09/15/2021   Procedure: Gastroc recession;  Surgeon: Wylene Simmer, MD;  Location: [place];  Service: Orthopedics;  Laterality: Right;   HAMMER TOE SURGERY Right 09/15/2021   Procedure: Right 2-4 metatarsal head resection; hammertoe correction;  Surgeon: Wylene Simmer, MD;  Location: [place];  Service: Orthopedics;  Laterality: Right;   HEMORROIDECTOMY  1970's   INGUINAL HERNIA REPAIR Bilateral 1980's   PENILE PROSTHESIS IMPLANT N/A 09/29/2016   Procedure: PENILE PROTHESIS INFLATABLE;  Surgeon: Carolan Clines, MD;  Location: [place];  Service: Urology;  Laterality: N/A;   UMBILICAL HERNIA REPAIR  2007 approx    Social History [employer]  reports that he has never smoked. He has never used smokeless tobacco. He reports that he does not drink alcohol and does not use drugs.  family history includes Arthritis in his mother; COPD in his brother; Colon polyps in his mother; Diabetes in his mother and sister; Hyperlipidemia in his mother; Hypertension in his mother; Leukemia in his brother; Pancreatic cancer in his brother.  No Known Allergies     PHYSICAL EXAMINATION: Vital signs:  BP 110/70   Pulse (!) 50   Ht 5' 5.5" (1.664 m)   Wt 135 lb 9.6 oz (61.5 kg)   BMI 22.22 kg/m   Constitutional: generally well-appearing, no acute distress Psychiatric: alert and oriented x3, cooperative Eyes: extraocular movements intact, anicteric, conjunctiva pink Mouth: oral pharynx moist, no lesions Neck: supple no lymphadenopathy Cardiovascular: heart regular rate and rhythm, no murmur Lungs: clear to auscultation  bilaterally Abdomen: soft, nontender, nondistended, no obvious ascites, no peritoneal signs, normal bowel sounds, no organomegaly.  He does have evidence of prior umbilical hernia repair.  There is a small ventral hernia just above the umbilicus.  Mildly uncomfortable to palpation.  Otherwise unremarkable Rectal: Omitted Extremities: no clubbing, cyanosis, or lower extremity edema bilaterally Skin: no lesions on visible extremities Neuro: No focal deficits.  Cranial nerves intact  ASSESSMENT:  1.  GERD complicated by peptic stricture.  Asymptomatic post dilation on every other day PPI 2.  History of multiple adenomatous colon polyps.  Aged out of surveillance 3.  Small ventral hernia without complicating features   PLAN:  1.  Reflux precautions 2.  Refill omeprazole 40 mg daily.  Medication risk reviewed 3.  Discussed with him today for hernia.  Discussed with him that if he had persistent unrelenting or severe pain, this may be bowel entrapment, he should proceed to the emergency room.  He understands and agrees. 4.  Routine GI follow-up 2 years.  Sooner if needed

## 2022-03-31 ENCOUNTER — Other Ambulatory Visit: Payer: Self-pay | Admitting: Internal Medicine

## 2022-03-31 DIAGNOSIS — E785 Hyperlipidemia, unspecified: Secondary | ICD-10-CM

## 2022-04-17 ENCOUNTER — Ambulatory Visit: Payer: Self-pay | Admitting: Licensed Clinical Social Worker

## 2022-04-17 NOTE — Patient Instructions (Signed)
Visit Information  Thank you for taking time to visit with me today. Please don't hesitate to contact me if I can be of assistance to you.   Following are the goals we discussed today:   Goals Addressed             This Visit's Progress    COMPLETED: Care Coordination Activities/ No follow up required       Care Coordination Interventions: Collaborated with office to schedule AWV Assessed needs for care coordination         Please call the care guide team at [phone] if you need to cancel or reschedule your appointment.   If you are experiencing a Mental Health or [place] or need someone to talk to, please call 1-800-273-TALK (toll free, 24 hour hotline)   The patient verbalized understanding of instructions, educational materials, and care plan provided today and DECLINED offer to receive copy of patient instructions, educational materials, and care plan.   No further follow up required: [place], [place] [phone]

## 2022-04-17 NOTE — Patient Outreach (Signed)
  Care Coordination   04/17/2022 Name: Don Johnson MRN: [id number] DOB: 1940-12-16   Care Coordination Outreach Attempts:  Contact was made with the patient today to offer care coordination services as a benefit of their health plan. The patient requested a return call on a later date.   Follow Up Plan:  phone appointment scheduled 04/17/22  Encounter Outcome:  Pt. Scheduled  Care Coordination Interventions Activated:  No   Care Coordination Interventions:  No, not indicated    Casimer Lanius, [place] [phone]

## 2022-04-17 NOTE — Patient Outreach (Signed)
  Care Coordination  Initial Visit Note   04/17/2022 Name: Don Johnson MRN: [id number] DOB: [birth year]  GARELD OBRECHT is a 81 y.o. year old male who sees Janith Lima, MD for primary care. I spoke with  Toni Amend by phone today  What matters to the patients health and wellness today?     Patient reports no concerns or needs with health and wellness related to physical or mental heath. .  Recommendation: Patient may benefit from, and is in agreement to To allow LCSW to make referral to office to Schedule AWV.    Goals Addressed             This Visit's Progress    COMPLETED: Care Coordination Activities/ No follow up required       Care Coordination Interventions: Collaborated with office to schedule AWV Assessed needs for care coordination         SDOH assessments and interventions completed:  Yes  SDOH Interventions Today    Flowsheet Row Most Recent Value  SDOH Interventions   Food Insecurity Interventions Intervention Not Indicated  Housing Interventions Intervention Not Indicated  Stress Interventions Intervention Not Indicated  Social Connections Interventions Intervention Not Indicated  Transportation Interventions Intervention Not Indicated       Care Coordination Interventions Activated:  Yes  Care Coordination Interventions:  Yes, provided   Follow up plan: No further intervention required. Referral made to schedule AWV with provider office    Encounter Outcome:  Pt. Visit Completed   Casimer Lanius, [place] [phone]

## 2022-05-19 ENCOUNTER — Ambulatory Visit (INDEPENDENT_AMBULATORY_CARE_PROVIDER_SITE_OTHER): Payer: Medicare Other | Admitting: *Deleted

## 2022-05-19 ENCOUNTER — Encounter: Payer: Self-pay | Admitting: *Deleted

## 2022-05-19 VITALS — Ht 65.5 in | Wt 135.0 lb

## 2022-05-19 DIAGNOSIS — Z0000 Encounter for general adult medical examination without abnormal findings: Secondary | ICD-10-CM | POA: Diagnosis not present

## 2022-05-19 NOTE — Progress Notes (Signed)
Subjective:   Don Johnson is a 81 y.o. male who presents for an Initial Medicare Annual Wellness Visit.  I connected with  Toni Amend on 05/19/22 by a audio enabled telemedicine application and verified that I am speaking with the correct person using two identifiers.  Patient Location: Home  Provider Location: Office/Clinic  I discussed the limitations of evaluation and management by telemedicine. The patient expressed understanding and agreed to proceed.   Review of Systems    Defer to PCP    See problem list for additional risk factors    Objective:    Today's Vitals   05/19/22 1130  Weight: 135 lb (61.2 kg)  Height: 5' 5.5" (1.664 m)   Body mass index is 22.12 kg/m.     05/19/2022   11:31 AM 09/15/2021    9:26 AM 11/03/2020   11:34 AM 11/06/2017    2:13 PM 09/29/2016    7:26 AM 08/01/2016    1:04 PM 08/01/2015    5:36 PM  Advanced Directives  Does Patient Have a Medical Advance Directive? No Yes Yes Yes Yes Yes Yes  Type of Social research officer, government;Living will   [place];Living will [place];Living will Living will;Healthcare Power of Attorney  Does patient want to make changes to medical advance directive?  No - Patient declined   No - Patient declined  No - Patient declined  Copy of [place] in Chart?     No - copy requested Yes Yes  Would patient like information on creating a medical advance directive? No - Patient declined          Current Medications (verified) Outpatient Encounter Medications as of 05/19/2022  Medication Sig   atorvastatin (LIPITOR) 40 MG tablet TAKE ONE TABLET BY MOUTH DAILY   Multiple Vitamins-Minerals (CENTRUM SILVER PO) Take by mouth daily.   omeprazole (PRILOSEC) 40 MG capsule Take 1 capsule (40 mg total) by mouth daily.   No facility-administered encounter medications on file as of 05/19/2022.    Allergies (verified) Patient has no known  allergies.   History: Past Medical History:  Diagnosis Date   Bilateral recurrent inguinal hernia    BPH with obstruction/lower urinary tract symptoms    Cough 09/20/2016   DDD (degenerative disc disease), cervical    Diverticulosis of colon    GERD (gastroesophageal reflux disease)    Hiatal hernia    History of adenomatous polyp of colon    2006 tubular adenoma;  2010 tubular adenoma and hyperplastic   Hyperlipidemia    OA (osteoarthritis)    hands   Organic sexual dysfunction    Pre-diabetes    S/P dilatation of esophageal stricture    multiple times --  last time w/ balloon 12-31-2015   Wears dentures    upper denture, lower partial   Wears glasses    Past Surgical History:  Procedure Laterality Date   COLONOSCOPY  last one 03-14-2011   ESOPHAGOGASTRODUODENOSCOPY (EGD) WITH ESOPHAGEAL DILATION  last one 12-31-2015   GASTROCNEMIUS RECESSION Right 09/15/2021   Procedure: Gastroc recession;  Surgeon: Wylene Simmer, MD;  Location: [facility];  Service: Orthopedics;  Laterality: Right;   HAMMER TOE SURGERY Right 09/15/2021   Procedure: Right 2-4 metatarsal head resection; hammertoe correction;  Surgeon: Wylene Simmer, MD;  Location: [facility];  Service: Orthopedics;  Laterality: Right;   HEMORROIDECTOMY  1970's   INGUINAL HERNIA REPAIR Bilateral 1980's  PENILE PROSTHESIS IMPLANT N/A 09/29/2016   Procedure: PENILE PROTHESIS INFLATABLE;  Surgeon: Carolan Clines, MD;  Location: [facility];  Service: Urology;  Laterality: N/A;   UMBILICAL HERNIA REPAIR  2007 approx   Family History  Problem Relation Age of Onset   Diabetes Mother    Colon polyps Mother    Arthritis Mother    Hypertension Mother    Hyperlipidemia Mother    Diabetes Sister    Leukemia Brother    COPD Brother    Pancreatic cancer Brother    Colon cancer Neg Hx    Stomach cancer Neg Hx    Social History   Socioeconomic History   Marital status: Married     Spouse name: Not on file   Number of children: 1   Years of education: Not on file   Highest education level: Not on file  Occupational History   Occupation: Retired  Tobacco Use   Smoking status: Never   Smokeless tobacco: Never  Vaping Use   Vaping Use: Never used  Substance and Sexual Activity   Alcohol use: No    Alcohol/week: 0.0 standard drinks of alcohol   Drug use: No   Sexual activity: Not Currently  Other Topics Concern   Not on file  Social History Narrative   Regular exercise-yes   Daily Caffeine Use-1 1/2 cup coffee in AM   Married, 1 boy   Social Determinants of Health   Financial Resource Strain: Low Risk  (05/19/2022)   Overall Financial Resource Strain (CARDIA)    Difficulty of Paying Living Expenses: Not hard at all  Food Insecurity: No Food Insecurity (05/19/2022)   Hunger Vital Sign    Worried About Running Out of Food in the Last Year: Never true    Ran Out of Food in the Last Year: Never true  Transportation Needs: No Transportation Needs (05/19/2022)   PRAPARE - Hydrologist (Medical): No    Lack of Transportation (Non-Medical): No  Physical Activity: Sufficiently Active (05/19/2022)   Exercise Vital Sign    Days of Exercise per Week: 7 days    Minutes of Exercise per Session: 30 min  Stress: No Stress Concern Present (05/19/2022)   [place]    Feeling of Stress : Not at all  Social Connections: Moderately Integrated (05/19/2022)   Social Connection and Isolation Panel [NHANES]    Frequency of Communication with Friends and Family: Twice a week    Frequency of Social Gatherings with Friends and Family: Twice a week    Attends Religious Services: More than 4 times per year    Active Member of [employer] or Organizations: No    Attends Music therapist: Never    Marital Status: Married    Tobacco Counseling Counseling given: Not Answered   Clinical  Intake:  Pre-visit preparation completed: Yes  Pain : No/denies pain     Nutritional Status: BMI of 19-24  Normal Nutritional Risks: None Diabetes: No  How often do you need to have someone help you when you read instructions, pamphlets, or other written materials from your doctor or pharmacy?: 1 - Never What is the last grade level you completed in school?: 9th grade  Diabetic?no  Interpreter Needed?: No      Activities of Daily Living    05/19/2022   11:37 AM 09/15/2021    9:28 AM  In your present state of health, do you have  any difficulty performing the following activities:  Hearing? 0 0  Vision? 1 0  Difficulty concentrating or making decisions? 0 0  Walking or climbing stairs?  0  Dressing or bathing? 0 0  Doing errands, shopping? 0     Patient Care Team: Janith Lima, MD as PCP - General  Indicate any recent Medical Services you may have received from other than [facility] providers in the past year (date may be approximate).     Assessment:   This is a routine wellness examination for [place].  Hearing/Vision screen No results found.  Dietary issues and exercise activities discussed: Current Exercise Habits: Home exercise routine, Type of exercise: walking, Time (Minutes): 30, Frequency (Times/Week): 7, Weekly Exercise (Minutes/Week): 210, Intensity: Moderate, Exercise limited by: None identified   Goals Addressed   None   Depression Screen    05/19/2022   11:36 AM 05/19/2022   11:31 AM 10/06/2020    8:35 AM 09/02/2019    8:20 AM 08/30/2018   10:19 AM 08/28/2018    8:51 AM 08/18/2017   10:48 AM  PHQ 2/9 Scores  PHQ - 2 Score 0 0 0 0 0 0 0  PHQ- 9 Score       0    Fall Risk    05/19/2022   11:31 AM 10/06/2020    8:35 AM 09/02/2019    8:20 AM 08/06/2019   10:18 AM 08/30/2018   10:20 AM  Fall Risk   Falls in the past year? 0 0 0 0 0  Comment    Emmi Telephone Survey: data to providers prior to load   Number falls in past yr: 0  0    Injury with  Fall? 0  0    Risk for fall due to : No Fall Risks      Follow up Falls evaluation completed  Falls evaluation completed      [place]:  Any stairs in or around the home? Yes  If so, are there any without handrails? Yes  Home free of loose throw rugs in walkways, pet beds, electrical cords, etc? Yes  Adequate lighting in your home to reduce risk of falls? Yes   ASSISTIVE DEVICES UTILIZED TO PREVENT FALLS:  Life alert? No  Use of a cane, walker or w/c? No  Grab bars in the bathroom? No  Shower chair or bench in shower? No  Elevated toilet seat or a handicapped toilet? No   TIMED UP AND GO:  Was the test performed? No .  Length of time to ambulate 10 feet: n/a sec.     Cognitive Function:        05/19/2022   11:38 AM  6CIT Screen  What Year? 0 points  What month? 0 points  What time? 0 points  Count back from 20 0 points  Months in reverse 0 points  Repeat phrase 0 points  Total Score 0 points    Immunizations Immunization History  Administered Date(s) Administered   Fluad Quad(high Dose 65+) 06/20/2019, 06/22/2021   Influenza Split 07/10/2011, 06/27/2012   Influenza Whole 06/09/2009, 06/28/2010   Influenza, High Dose Seasonal PF 06/30/2013, 07/29/2015, 06/12/2016, 06/21/2017, 07/02/2018   Influenza,inj,Quad PF,6+ Mos 07/08/2014   Pneumococcal Conjugate-13 06/04/2013   Pneumococcal Polysaccharide-23 07/29/2015   Td 02/01/2010   Tdap 09/09/2021   Zoster Recombinat (Shingrix) 12/19/2019, 03/16/2020   Zoster, Live 07/08/2014    TDAP completed 09/09/2021  Flu Vaccine status: Due, Education has been provided regarding  the importance of this vaccine. Advised may receive this vaccine at local pharmacy or Health Dept. Aware to provide a copy of the vaccination record if obtained from local pharmacy or Health Dept. Verbalized acceptance and understanding.  Pneumococcal vaccine status: Up to date  Covid-19 vaccine status: Completed  vaccines  Qualifies for Shingles Vaccine? Yes   Zostavax completed Yes   Shingrix Completed?: Yes  Screening Tests Health Maintenance  Topic Date Due   COVID-19 Vaccine (1) Never done   INFLUENZA VACCINE  12/10/2022 (Originally 04/11/2022)   TETANUS/TDAP  09/10/2031   Pneumonia Vaccine 40+ Years old  Completed   Zoster Vaccines- Shingrix  Completed   HPV VACCINES  Aged Out    Health Maintenance  Health Maintenance Due  Topic Date Due   COVID-19 Vaccine (1) Never done    Colorectal cancer screening: No longer required.   Lung Cancer Screening: (Low Dose CT Chest recommended if Age 40-80 years, 30 pack-year currently smoking OR have quit w/in 15years.) does not qualify.   Lung Cancer Screening Referral: n/a  Additional Screening:  Hepatitis C Screening: does qualify; Completed no  Vision Screening: Recommended annual ophthalmology exams for early detection of glaucoma and other disorders of the eye. Is the patient up to date with their annual eye exam?  Yes  Who is the provider or what is the name of the office in which the patient attends annual eye exams? unknown If pt is not established with a provider, would they like to be referred to a provider to establish care? No .   Dental Screening: Recommended annual dental exams for proper oral hygiene  Community Resource Referral / Chronic Care Management: CRR required this visit?  No   CCM required this visit?  No      Plan:     I have personally reviewed and noted the following in the patient's chart:   Medical and social history Use of alcohol, tobacco or illicit drugs  Current medications and supplements including opioid prescriptions. Patient is not currently taking opioid prescriptions. Functional ability and status Nutritional status Physical activity Advanced directives List of other physicians Hospitalizations, surgeries, and ER visits in previous 12 months Vitals Screenings to include cognitive,  depression, and falls Referrals and appointments  In addition, I have reviewed and discussed with patient certain preventive protocols, quality metrics, and best practice recommendations. A written personalized care plan for preventive services as well as general preventive health recommendations were provided to patient.     Mylinda Latina, [place]   05/19/2022  Non face to face 30 minutes  Nurse Notes:  Mr. Dorner , Thank you for taking time to come for your Medicare Wellness Visit. I appreciate your ongoing commitment to your health goals. Please review the following plan we discussed and let me know if I can assist you in the future.   These are the goals we discussed:  Goals   None     This is a list of the screening recommended for you and due dates:  Health Maintenance  Topic Date Due   COVID-19 Vaccine (1) Never done   Flu Shot  12/10/2022*   Tetanus Vaccine  09/10/2031   Pneumonia Vaccine  Completed   Zoster (Shingles) Vaccine  Completed   HPV Vaccine  Aged Out  *Topic was postponed. The date shown is not the original due date.

## 2022-07-04 DIAGNOSIS — Z23 Encounter for immunization: Secondary | ICD-10-CM | POA: Diagnosis not present

## 2022-10-25 ENCOUNTER — Other Ambulatory Visit: Payer: Self-pay | Admitting: Internal Medicine

## 2022-10-25 DIAGNOSIS — E785 Hyperlipidemia, unspecified: Secondary | ICD-10-CM

## 2023-01-26 ENCOUNTER — Other Ambulatory Visit: Payer: Self-pay | Admitting: Internal Medicine

## 2023-01-26 DIAGNOSIS — E785 Hyperlipidemia, unspecified: Secondary | ICD-10-CM

## 2023-02-14 ENCOUNTER — Ambulatory Visit (INDEPENDENT_AMBULATORY_CARE_PROVIDER_SITE_OTHER): Payer: Medicare Other

## 2023-02-14 ENCOUNTER — Encounter: Payer: Self-pay | Admitting: Internal Medicine

## 2023-02-14 ENCOUNTER — Ambulatory Visit (INDEPENDENT_AMBULATORY_CARE_PROVIDER_SITE_OTHER): Payer: Medicare Other | Admitting: Internal Medicine

## 2023-02-14 VITALS — BP 136/82 | HR 73 | Temp 98.4°F | Resp 16 | Ht 65.0 in | Wt 135.2 lb

## 2023-02-14 DIAGNOSIS — R509 Fever, unspecified: Secondary | ICD-10-CM

## 2023-02-14 DIAGNOSIS — E785 Hyperlipidemia, unspecified: Secondary | ICD-10-CM | POA: Diagnosis not present

## 2023-02-14 DIAGNOSIS — R051 Acute cough: Secondary | ICD-10-CM | POA: Diagnosis not present

## 2023-02-14 DIAGNOSIS — R7303 Prediabetes: Secondary | ICD-10-CM

## 2023-02-14 DIAGNOSIS — I10 Essential (primary) hypertension: Secondary | ICD-10-CM | POA: Diagnosis not present

## 2023-02-14 DIAGNOSIS — R059 Cough, unspecified: Secondary | ICD-10-CM | POA: Diagnosis not present

## 2023-02-14 DIAGNOSIS — J449 Chronic obstructive pulmonary disease, unspecified: Secondary | ICD-10-CM | POA: Diagnosis not present

## 2023-02-14 LAB — HEPATIC FUNCTION PANEL
ALT: 15 U/L (ref 0–53)
AST: 19 U/L (ref 0–37)
Albumin: 4.1 g/dL (ref 3.5–5.2)
Alkaline Phosphatase: 79 U/L (ref 39–117)
Bilirubin, Direct: 0.2 mg/dL (ref 0.0–0.3)
Total Bilirubin: 0.6 mg/dL (ref 0.2–1.2)
Total Protein: 7 g/dL (ref 6.0–8.3)

## 2023-02-14 LAB — CBC WITH DIFFERENTIAL/PLATELET
Basophils Absolute: 0.1 10*3/uL (ref 0.0–0.1)
Basophils Relative: 0.5 % (ref 0.0–3.0)
Eosinophils Absolute: 0.3 10*3/uL (ref 0.0–0.7)
Eosinophils Relative: 2.8 % (ref 0.0–5.0)
HCT: 44 % (ref 39.0–52.0)
Hemoglobin: 14.1 g/dL (ref 13.0–17.0)
Lymphocytes Relative: 13.3 % (ref 12.0–46.0)
Lymphs Abs: 1.4 10*3/uL (ref 0.7–4.0)
MCHC: 32.2 g/dL (ref 30.0–36.0)
MCV: 94 fl (ref 78.0–100.0)
Monocytes Absolute: 1.4 10*3/uL — ABNORMAL HIGH (ref 0.1–1.0)
Monocytes Relative: 13.8 % — ABNORMAL HIGH (ref 3.0–12.0)
Neutro Abs: 7.3 10*3/uL (ref 1.4–7.7)
Neutrophils Relative %: 69.6 % (ref 43.0–77.0)
Platelets: 228 10*3/uL (ref 150.0–400.0)
RBC: 4.68 Mil/uL (ref 4.22–5.81)
RDW: 13.7 % (ref 11.5–15.5)
WBC: 10.5 10*3/uL (ref 4.0–10.5)

## 2023-02-14 LAB — BASIC METABOLIC PANEL
BUN: 20 mg/dL (ref 6–23)
CO2: 27 mEq/L (ref 19–32)
Calcium: 8.9 mg/dL (ref 8.4–10.5)
Chloride: 104 mEq/L (ref 96–112)
Creatinine, Ser: 0.95 mg/dL (ref 0.40–1.50)
GFR: 74.71 mL/min (ref >60.00)
Glucose, Bld: 99 mg/dL (ref 70–99)
Potassium: 4.2 mEq/L (ref 3.5–5.1)
Sodium: 141 mEq/L (ref 135–145)

## 2023-02-14 LAB — HEMOGLOBIN A1C: Hgb A1c MFr Bld: 6.2 % (ref 4.6–6.5)

## 2023-02-14 LAB — TSH: TSH: 1.66 u[IU]/mL (ref 0.35–5.50)

## 2023-02-14 MED ORDER — ATORVASTATIN CALCIUM 40 MG PO TABS: 40.0000 mg | ORAL_TABLET | Freq: Every day | ORAL | 0 refills | Status: DC

## 2023-02-14 NOTE — Progress Notes (Unsigned)
Subjective:  Patient ID: Don Johnson, male    DOB: Feb 01, 1941  Age: 82 y.o. MRN: [id number]  CC: Hyperlipidemia and Cough   HPI Don Johnson presents for f/up ----  He complains of a 1 week history of nonproductive cough, sore throat, laryngitis, low-grade fever, and chills.  He denies chest pain, shortness of breath, diaphoresis, or hemoptysis.  He is active and denies chest pain or diaphoresis.  Outpatient Medications Prior to Visit  Medication Sig Dispense Refill   Multiple Vitamins-Minerals (CENTRUM SILVER PO) Take by mouth daily.     omeprazole (PRILOSEC) 40 MG capsule Take 1 capsule (40 mg total) by mouth daily. 90 capsule 3   atorvastatin (LIPITOR) 40 MG tablet TAKE ONE TABLET BY MOUTH DAILY 90 tablet 1   No facility-administered medications prior to visit.    ROS Review of Systems  Constitutional:  Positive for chills and fever. Negative for fatigue and unexpected weight change.  HENT:  Positive for sore throat and voice change. Negative for sinus pressure and trouble swallowing.   Eyes: Negative.   Respiratory:  Positive for cough. Negative for shortness of breath and wheezing.   Cardiovascular:  Negative for chest pain, palpitations and leg swelling.  Gastrointestinal: Negative.  Negative for abdominal pain.  Endocrine: Negative.   Genitourinary:  Negative for difficulty urinating and frequency.  Musculoskeletal: Negative.   Skin: Negative.   Neurological:  Negative for dizziness, weakness, light-headedness and headaches.  Hematological:  Negative for adenopathy. Does not bruise/bleed easily.  Psychiatric/Behavioral: Negative.      Objective:  BP 136/82 (BP Location: Left Arm, Patient Position: Sitting, Cuff Size: Normal)   Pulse 73   Temp 98.4 F (36.9 C) (Oral)   Resp 16   Ht 5\' 5"  (1.651 m)   Wt 135 lb 3.2 oz (61.3 kg)   SpO2 98%   BMI 22.50 kg/m   BP Readings from Last 3 Encounters:  02/14/23 136/82  02/17/22 110/70  09/15/21 (!) 152/91     Wt Readings from Last 3 Encounters:  02/14/23 135 lb 3.2 oz (61.3 kg)  05/19/22 135 lb (61.2 kg)  02/17/22 135 lb 9.6 oz (61.5 kg)    Physical Exam Vitals reviewed.  Constitutional:      General: He is not in acute distress.    Appearance: He is not ill-appearing, toxic-appearing or diaphoretic.  HENT:     Nose: Nose normal.     Mouth/Throat:     Mouth: Mucous membranes are moist.     Pharynx: Oropharynx is clear. No pharyngeal swelling, oropharyngeal exudate, posterior oropharyngeal erythema or uvula swelling.     Tonsils: No tonsillar exudate.  Eyes:     General: No scleral icterus. Cardiovascular:     Rate and Rhythm: Normal rate and regular rhythm.     Heart sounds: No murmur heard.    No friction rub.  Pulmonary:     Effort: Pulmonary effort is normal.     Breath sounds: No stridor. No wheezing, rhonchi or rales.  Abdominal:     General: Abdomen is flat.     Palpations: There is no mass.     Tenderness: There is no abdominal tenderness. There is no guarding.     Hernia: No hernia is present.  Musculoskeletal:        General: Normal range of motion.     Cervical back: Neck supple.     Right lower leg: No edema.     Left lower leg: No edema.  Lymphadenopathy:  Cervical: No cervical adenopathy.  Skin:    General: Skin is warm and dry.  Neurological:     General: No focal deficit present.     Mental Status: He is alert. Mental status is at baseline.  Psychiatric:        Mood and Affect: Mood normal.        Behavior: Behavior normal.     Lab Results  Component Value Date   WBC 5.7 09/01/2021   HGB 14.0 09/01/2021   HCT 42.7 09/01/2021   PLT 217.0 09/01/2021   GLUCOSE 87 09/01/2021   CHOL 138 10/06/2020   TRIG 44.0 10/06/2020   HDL 54.50 10/06/2020   LDLDIRECT 149.1 06/30/2013   LDLCALC 74 10/06/2020   ALT 21 09/01/2021   AST 27 09/01/2021   NA 141 09/01/2021   K 4.0 09/01/2021   CL 104 09/01/2021   CREATININE 0.90 09/01/2021   BUN 20  09/01/2021   CO2 33 (H) 09/01/2021   TSH 2.15 09/01/2021   PSA 0.57 09/02/2019   HGBA1C 6.3 09/01/2021    DG MINI C-ARM IMAGE ONLY  Result Date: 09/15/2021 There is no interpretation for this exam.  This order is for images obtained during a surgical procedure.  Please See "Surgeries" Tab for more information regarding the procedure.   DG Chest 2 View  Result Date: 02/14/2023 CLINICAL DATA:  Cough and fever for 1 week. EXAM: CHEST - 2 VIEW COMPARISON:  08/28/2018. FINDINGS: Normal sized heart. Clear lungs. The lungs are mildly hyperexpanded with mild peribronchial thickening. Stable mild fullness of the right paratracheal region in the superior mediastinum compatible with vascular tortuosity or a possible substernal goiter. Unremarkable bones. IMPRESSION: No acute abnormality. Mild changes of COPD and chronic bronchitis. Electronically Signed   By: Beckie Salts M.D.   On: 02/14/2023 10:38     Assessment & Plan:  Acute cough -     DG Chest 2 View; Future  Fever, unspecified fever cause -     CBC with Differential/Platelet; Future -     Hepatic function panel; Future -     DG Chest 2 View; Future  Essential hypertension -     TSH; Future -     Hepatic function panel; Future  Prediabetes -     Basic metabolic panel; Future -     Hemoglobin A1c; Future -     Hepatic function panel; Future  Hyperlipidemia with target LDL less than 130 -     Atorvastatin Calcium; Take 1 tablet (40 mg total) by mouth daily.  Dispense: 90 tablet; Refill: 0     Follow-up: No follow-ups on file.  Sanda Linger, MD

## 2023-02-15 ENCOUNTER — Telehealth: Payer: Self-pay | Admitting: Internal Medicine

## 2023-02-15 ENCOUNTER — Encounter: Payer: Self-pay | Admitting: Internal Medicine

## 2023-02-15 DIAGNOSIS — R509 Fever, unspecified: Secondary | ICD-10-CM | POA: Diagnosis not present

## 2023-02-15 DIAGNOSIS — R21 Rash and other nonspecific skin eruption: Secondary | ICD-10-CM | POA: Diagnosis not present

## 2023-02-15 NOTE — Telephone Encounter (Signed)
Patient called, he would like a call back to discuss the results from his labs taken on yesterday. Patient can be reached on his cell at [phone].

## 2023-02-15 NOTE — Patient Instructions (Signed)
Viral Respiratory Infection A respiratory infection is an illness that affects part of the respiratory system, such as the lungs, nose, or throat. A respiratory infection that is caused by a virus is called a viral respiratory infection. Common types of viral respiratory infections include: A cold. The flu (influenza). A respiratory syncytial virus (RSV) infection. What are the causes? This condition is caused by a virus. The virus may spread through contact with droplets or direct contact with infected people or their mucus or secretions. The virus may spread from person to person (is contagious). What are the signs or symptoms? Symptoms of this condition include: A stuffy or runny nose. A sore throat or cough. Shortness of breath or difficulty breathing. Yellow or green mucus (sputum). Other symptoms may include: A fever. Sweating or chills. Fatigue. Achy muscles. A headache. How is this diagnosed? This condition may be diagnosed based on: Your symptoms. A physical exam. Testing of secretions from the nose or throat. Chest X-ray. How is this treated? This condition may be treated with medicines, such as: Antiviral medicine. This may shorten the length of time a person has symptoms. Expectorants. These make it easier to cough up mucus. Decongestant nasal sprays. Acetaminophen or NSAIDs, such as ibuprofen, to relieve fever and pain. Antibiotic medicines are not prescribed for viral infections.This is because antibiotics are designed to kill bacteria. They do not kill viruses. Follow these instructions at home: Managing pain and congestion Take over-the-counter and prescription medicines only as told by your health care provider. If you have a sore throat, gargle with a mixture of salt and water 3-4 times a day or as needed. To make salt water, completely dissolve -1 tsp (3-6 g) of salt in 1 cup (237 mL) of warm water. Use nose drops made from salt water to ease congestion and  soften raw skin around your nose. Take 2 tsp (10 mL) of honey at bedtime to lessen coughing at night. Do not give honey to children who are younger than 1 year. Drink enough fluid to keep your urine pale yellow. This helps prevent dehydration and helps loosen up mucus. General instructions  Rest as much as possible. Do not drink alcohol. Do not use any products that contain nicotine or tobacco. These products include cigarettes, chewing tobacco, and vaping devices, such as e-cigarettes. If you need help quitting, ask your health care provider. Keep all follow-up visits. This is important. How is this prevented?     Get an annual flu shot. You may get the flu shot in late summer, fall, or winter. Ask your health care provider when you should get your flu shot. Avoid spreading your infection to other people. If you are sick: Wash your hands with soap and water often, especially after you cough or sneeze. Wash for at least 20 seconds. If soap and water are not available, use alcohol-based hand sanitizer. Cover your mouth when you cough. Cover your nose and mouth when you sneeze. Do not share cups or eating utensils. Clean commonly used objects often. Clean commonly touched surfaces. Stay home from work or school as told by your health care provider. Avoid contact with people who are sick during cold and flu season. This is generally fall and winter. Contact a health care provider if: Your symptoms last for 10 days or longer. Your symptoms get worse over time. You have severe sinus pain in your face or forehead. The glands in your jaw or neck become very swollen. You have shortness of breath. Get   help right away if you: Feel pain or pressure in your chest. Have trouble breathing. Faint or feel like you will faint. Have severe and persistent vomiting. Feel confused or disoriented. These symptoms may represent a serious problem that is an emergency. Do not wait to see if the symptoms will  go away. Get medical help right away. Call your local emergency services (911 in the [place] Do not drive yourself to the hospital. Summary A respiratory infection is an illness that affects part of the respiratory system, such as the lungs, nose, or throat. A respiratory infection that is caused by a virus is called a viral respiratory infection. Common types of viral respiratory infections include a cold, influenza, and respiratory syncytial virus (RSV) infection. Symptoms of this condition include a stuffy or runny nose, cough, fatigue, achy muscles, sore throat, and fevers or chills. Antibiotic medicines are not prescribed for viral infections. This is because antibiotics are designed to kill bacteria. They are not effective against viruses. This information is not intended to replace advice given to you by your health care provider. Make sure you discuss any questions you have with your health care provider. Document Revised: 12/02/2020 Document Reviewed: 12/02/2020 Elsevier Patient Education  2024 [employer]  

## 2023-02-15 NOTE — Telephone Encounter (Signed)
Per 6/5 letter:  "Your labs are okay/stable. I do not see any new concerns."  Pt has been informed and expressed understanding.

## 2023-02-21 ENCOUNTER — Encounter: Payer: Self-pay | Admitting: Internal Medicine

## 2023-02-21 ENCOUNTER — Ambulatory Visit (INDEPENDENT_AMBULATORY_CARE_PROVIDER_SITE_OTHER): Payer: Medicare Other | Admitting: Internal Medicine

## 2023-02-21 VITALS — BP 168/78 | HR 66 | Temp 98.1°F | Resp 16 | Ht 65.0 in | Wt 140.0 lb

## 2023-02-21 DIAGNOSIS — I10 Essential (primary) hypertension: Secondary | ICD-10-CM

## 2023-02-21 MED ORDER — INDAPAMIDE 1.25 MG PO TABS: 1.2500 mg | ORAL_TABLET | Freq: Every day | ORAL | 0 refills | Status: DC

## 2023-02-21 NOTE — Progress Notes (Signed)
Subjective:  Patient ID: Don Johnson, male    DOB: 1941/07/22  Age: 82 y.o. MRN: [id number]  CC: Hypertension   HPI Don Johnson presents for f/up -----  Since I last saw him he has been seen elsewhere for an upper respiratory infection and it sounds like he got a shot of steroids it is either taking Tylenol or an antibiotic.  He complains of a several day history of his blood pressure being above 140/90.  He tells me he is not taking any decongestants or NSAIDs.  Outpatient Medications Prior to Visit  Medication Sig Dispense Refill   atorvastatin (LIPITOR) 40 MG tablet Take 1 tablet (40 mg total) by mouth daily. 90 tablet 0   Multiple Vitamins-Minerals (CENTRUM SILVER PO) Take by mouth daily.     omeprazole (PRILOSEC) 40 MG capsule Take 1 capsule (40 mg total) by mouth daily. 90 capsule 3   No facility-administered medications prior to visit.    ROS Review of Systems  Constitutional:  Negative for diaphoresis, fatigue and unexpected weight change.  HENT: Negative.    Eyes: Negative.   Respiratory: Negative.  Negative for cough, chest tightness, shortness of breath and wheezing.   Cardiovascular:  Negative for chest pain, palpitations and leg swelling.  Gastrointestinal:  Negative for abdominal pain, diarrhea, nausea and vomiting.  Endocrine: Negative.   Genitourinary:  Negative for difficulty urinating, dysuria and hematuria.  Musculoskeletal: Negative.  Negative for arthralgias, joint swelling and myalgias.  Skin: Negative.   Neurological:  Negative for dizziness, weakness, light-headedness and headaches.  Hematological:  Negative for adenopathy. Does not bruise/bleed easily.  Psychiatric/Behavioral: Negative.      Objective:  BP (!) 168/78 (BP Location: Right Arm, Patient Position: Sitting, Cuff Size: Normal)   Pulse 66   Temp 98.1 F (36.7 C) (Oral)   Resp 16   Ht 5\' 5"  (1.651 m)   Wt 140 lb (63.5 kg)   SpO2 97%   BMI 23.30 kg/m   BP Readings from Last 3  Encounters:  02/21/23 (!) 168/78  02/14/23 136/82  02/17/22 110/70    Wt Readings from Last 3 Encounters:  02/21/23 140 lb (63.5 kg)  02/14/23 135 lb 3.2 oz (61.3 kg)  05/19/22 135 lb (61.2 kg)    Physical Exam Vitals reviewed.  Constitutional:      Appearance: Normal appearance.  HENT:     Mouth/Throat:     Mouth: Mucous membranes are moist.  Eyes:     General: No scleral icterus.    Conjunctiva/sclera: Conjunctivae normal.  Cardiovascular:     Rate and Rhythm: Normal rate and regular rhythm.     Heart sounds: Normal heart sounds, S1 normal and S2 normal. No murmur heard.    No gallop.     Comments: EKG- NSR, 62 bpm No LVH, Q waves, ST/T wave changes Normal EKG Pulmonary:     Effort: Pulmonary effort is normal.     Breath sounds: No stridor. No wheezing, rhonchi or rales.  Abdominal:     General: Abdomen is flat.     Palpations: There is no mass.     Tenderness: There is no abdominal tenderness. There is no guarding.     Hernia: No hernia is present.  Musculoskeletal:     Cervical back: Neck supple.     Right lower leg: No edema.     Left lower leg: No edema.  Lymphadenopathy:     Cervical: No cervical adenopathy.  Skin:    General: Skin  is warm and dry.     Findings: No rash.  Neurological:     General: No focal deficit present.     Mental Status: He is alert. Mental status is at baseline.  Psychiatric:        Mood and Affect: Mood normal.        Behavior: Behavior normal.     Lab Results  Component Value Date   WBC 10.5 02/14/2023   HGB 14.1 02/14/2023   HCT 44.0 02/14/2023   PLT 228.0 02/14/2023   GLUCOSE 99 02/14/2023   CHOL 138 10/06/2020   TRIG 44.0 10/06/2020   HDL 54.50 10/06/2020   LDLDIRECT 149.1 06/30/2013   LDLCALC 74 10/06/2020   ALT 15 02/14/2023   AST 19 02/14/2023   NA 141 02/14/2023   K 4.2 02/14/2023   CL 104 02/14/2023   CREATININE 0.95 02/14/2023   BUN 20 02/14/2023   CO2 27 02/14/2023   TSH 1.66 02/14/2023   PSA 0.57  09/02/2019   HGBA1C 6.2 02/14/2023    DG MINI C-ARM IMAGE ONLY  Result Date: 09/15/2021 There is no interpretation for this exam.  This order is for images obtained during a surgical procedure.  Please See "Surgeries" Tab for more information regarding the procedure.    Assessment & Plan:    Essential hypertension- Recent labs are negative for secondary causes or endorgan damage.  EKG is negative for LVH.  Will treat with a thiazide diuretic. -     EKG 12-Lead -     Indapamide; Take 1 tablet (1.25 mg total) by mouth daily.  Dispense: 90 tablet; Refill: 0     Follow-up: Return in about 3 months (around 05/24/2023).  Sanda Linger, MD

## 2023-02-21 NOTE — Patient Instructions (Signed)
Hypertension, Adult High blood pressure (hypertension) is when the force of blood pumping through the arteries is too strong. The arteries are the blood vessels that carry blood from the heart throughout the body. Hypertension forces the heart to work harder to pump blood and may cause arteries to become narrow or stiff. Untreated or uncontrolled hypertension can lead to a heart attack, heart failure, a stroke, kidney disease, and other problems. A blood pressure reading consists of a higher number over a lower number. Ideally, your blood pressure should be below 120/80. The first ("top") number is called the systolic pressure. It is a measure of the pressure in your arteries as your heart beats. The second ("bottom") number is called the diastolic pressure. It is a measure of the pressure in your arteries as the heart relaxes. What are the causes? The exact cause of this condition is not known. There are some conditions that result in high blood pressure. What increases the risk? Certain factors may make you more likely to develop high blood pressure. Some of these risk factors are under your control, including: Smoking. Not getting enough exercise or physical activity. Being overweight. Having too much fat, sugar, calories, or salt (sodium) in your diet. Drinking too much alcohol. Other risk factors include: Having a personal history of heart disease, diabetes, high cholesterol, or kidney disease. Stress. Having a family history of high blood pressure and high cholesterol. Having obstructive sleep apnea. Age. The risk increases with age. What are the signs or symptoms? High blood pressure may not cause symptoms. Very high blood pressure (hypertensive crisis) may cause: Headache. Fast or irregular heartbeats (palpitations). Shortness of breath. Nosebleed. Nausea and vomiting. Vision changes. Severe chest pain, dizziness, and seizures. How is this diagnosed? This condition is diagnosed by  measuring your blood pressure while you are seated, with your arm resting on a flat surface, your legs uncrossed, and your feet flat on the floor. The cuff of the blood pressure monitor will be placed directly against the skin of your upper arm at the level of your heart. Blood pressure should be measured at least twice using the same arm. Certain conditions can cause a difference in blood pressure between your right and left arms. If you have a high blood pressure reading during one visit or you have normal blood pressure with other risk factors, you may be asked to: Return on a different day to have your blood pressure checked again. Monitor your blood pressure at home for 1 week or longer. If you are diagnosed with hypertension, you may have other blood or imaging tests to help your health care provider understand your overall risk for other conditions. How is this treated? This condition is treated by making healthy lifestyle changes, such as eating healthy foods, exercising more, and reducing your alcohol intake. You may be referred for counseling on a healthy diet and physical activity. Your health care provider may prescribe medicine if lifestyle changes are not enough to get your blood pressure under control and if: Your systolic blood pressure is above 130. Your diastolic blood pressure is above 80. Your personal target blood pressure may vary depending on your medical conditions, your age, and other factors. Follow these instructions at home: Eating and drinking  Eat a diet that is high in fiber and potassium, and low in sodium, added sugar, and fat. An example of this eating plan is called the DASH diet. DASH stands for Dietary Approaches to Stop Hypertension. To eat this way: Eat   plenty of fresh fruits and vegetables. Try to fill one half of your plate at each meal with fruits and vegetables. Eat whole grains, such as whole-wheat pasta, brown rice, or whole-grain bread. Fill about one  fourth of your plate with whole grains. Eat or drink low-fat dairy products, such as skim milk or low-fat yogurt. Avoid fatty cuts of meat, processed or cured meats, and poultry with skin. Fill about one fourth of your plate with lean proteins, such as fish, chicken without skin, beans, eggs, or tofu. Avoid pre-made and processed foods. These tend to be higher in sodium, added sugar, and fat. Reduce your daily sodium intake. Many people with hypertension should eat less than 1,500 mg of sodium a day. Do not drink alcohol if: Your health care provider tells you not to drink. You are pregnant, may be pregnant, or are planning to become pregnant. If you drink alcohol: Limit how much you have to: 0-1 drink a day for women. 0-2 drinks a day for men. Know how much alcohol is in your drink. In the [place] one drink equals one 12 oz bottle of beer (355 mL), one 5 oz glass of wine (148 mL), or one 1 oz glass of hard liquor (44 mL). Lifestyle  Work with your health care provider to maintain a healthy body weight or to lose weight. Ask what an ideal weight is for you. Get at least 30 minutes of exercise that causes your heart to beat faster (aerobic exercise) most days of the week. Activities may include walking, swimming, or biking. Include exercise to strengthen your muscles (resistance exercise), such as Pilates or lifting weights, as part of your weekly exercise routine. Try to do these types of exercises for 30 minutes at least 3 days a week. Do not use any products that contain nicotine or tobacco. These products include cigarettes, chewing tobacco, and vaping devices, such as e-cigarettes. If you need help quitting, ask your health care provider. Monitor your blood pressure at home as told by your health care provider. Keep all follow-up visits. This is important. Medicines Take over-the-counter and prescription medicines only as told by your health care provider. Follow directions carefully. Blood  pressure medicines must be taken as prescribed. Do not skip doses of blood pressure medicine. Doing this puts you at risk for problems and can make the medicine less effective. Ask your health care provider about side effects or reactions to medicines that you should watch for. Contact a health care provider if you: Think you are having a reaction to a medicine you are taking. Have headaches that keep coming back (recurring). Feel dizzy. Have swelling in your ankles. Have trouble with your vision. Get help right away if you: Develop a severe headache or confusion. Have unusual weakness or numbness. Feel faint. Have severe pain in your chest or abdomen. Vomit repeatedly. Have trouble breathing. These symptoms may be an emergency. Get help right away. Call 911. Do not wait to see if the symptoms will go away. Do not drive yourself to the hospital. Summary Hypertension is when the force of blood pumping through your arteries is too strong. If this condition is not controlled, it may put you at risk for serious complications. Your personal target blood pressure may vary depending on your medical conditions, your age, and other factors. For most people, a normal blood pressure is less than 120/80. Hypertension is treated with lifestyle changes, medicines, or a combination of both. Lifestyle changes include losing weight, eating a healthy,   low-sodium diet, exercising more, and limiting alcohol. This information is not intended to replace advice given to you by your health care provider. Make sure you discuss any questions you have with your health care provider. Document Revised: 07/05/2021 Document Reviewed: 07/05/2021 Elsevier Patient Education  2024 [employer] Inc.  

## 2023-05-15 ENCOUNTER — Other Ambulatory Visit: Payer: Self-pay | Admitting: Internal Medicine

## 2023-05-15 DIAGNOSIS — E785 Hyperlipidemia, unspecified: Secondary | ICD-10-CM

## 2023-05-25 ENCOUNTER — Other Ambulatory Visit: Payer: Self-pay | Admitting: Internal Medicine

## 2023-05-25 DIAGNOSIS — I10 Essential (primary) hypertension: Secondary | ICD-10-CM

## 2023-06-27 DIAGNOSIS — Z23 Encounter for immunization: Secondary | ICD-10-CM | POA: Diagnosis not present

## 2023-07-13 DIAGNOSIS — M1612 Unilateral primary osteoarthritis, left hip: Secondary | ICD-10-CM | POA: Insufficient documentation

## 2023-07-13 DIAGNOSIS — M25552 Pain in left hip: Secondary | ICD-10-CM | POA: Diagnosis not present

## 2023-07-13 DIAGNOSIS — M1632 Unilateral osteoarthritis resulting from hip dysplasia, left hip: Secondary | ICD-10-CM | POA: Insufficient documentation

## 2023-08-15 DIAGNOSIS — M21612 Bunion of left foot: Secondary | ICD-10-CM | POA: Diagnosis not present

## 2023-08-15 DIAGNOSIS — L84 Corns and callosities: Secondary | ICD-10-CM | POA: Diagnosis not present

## 2023-08-15 DIAGNOSIS — M79672 Pain in left foot: Secondary | ICD-10-CM | POA: Diagnosis not present

## 2023-08-15 DIAGNOSIS — L570 Actinic keratosis: Secondary | ICD-10-CM | POA: Insufficient documentation

## 2023-08-15 DIAGNOSIS — M21619 Bunion of unspecified foot: Secondary | ICD-10-CM | POA: Insufficient documentation

## 2023-08-16 ENCOUNTER — Other Ambulatory Visit: Payer: Self-pay | Admitting: Internal Medicine

## 2023-08-16 DIAGNOSIS — E785 Hyperlipidemia, unspecified: Secondary | ICD-10-CM

## 2023-09-10 ENCOUNTER — Other Ambulatory Visit: Payer: Self-pay | Admitting: Internal Medicine

## 2023-09-10 DIAGNOSIS — I10 Essential (primary) hypertension: Secondary | ICD-10-CM

## 2023-09-18 ENCOUNTER — Other Ambulatory Visit: Payer: Self-pay | Admitting: Internal Medicine

## 2023-09-18 DIAGNOSIS — I10 Essential (primary) hypertension: Secondary | ICD-10-CM

## 2023-10-12 ENCOUNTER — Telehealth: Payer: Self-pay | Admitting: Internal Medicine

## 2023-10-12 ENCOUNTER — Other Ambulatory Visit: Payer: Self-pay

## 2023-10-12 DIAGNOSIS — I10 Essential (primary) hypertension: Secondary | ICD-10-CM

## 2023-10-12 MED ORDER — INDAPAMIDE 1.25 MG PO TABS: 1.2500 mg | ORAL_TABLET | Freq: Every day | ORAL | 0 refills | Status: DC

## 2023-10-12 NOTE — Telephone Encounter (Signed)
Prescription Request  10/12/2023  LOV: 02/21/2023  What is the name of the medication or equipment? indapamide (LOZOL) 1.25 MG tablet [[id number]   Have you contacted your pharmacy to request a refill? Yes   Which pharmacy would you like this sent to?  [facility] - [place], [place] - [address] [place] [address] Phone: [phone] Fax: [phone]    Patient notified that their request is being sent to the clinical staff for review and that they should receive a response within 2 business days.   Please advise at Mobile [phone] (mobile)

## 2023-10-12 NOTE — Telephone Encounter (Signed)
Enough medication was sent in to get I'm to his appointment with Dr. Yetta Barre. No further refills will be given until he is seen.

## 2023-10-23 NOTE — Patient Instructions (Signed)
 SURGICAL WAITING ROOM VISITATION Patients having surgery or a procedure may have no more than 2 support people in the waiting area - these visitors may rotate.    Children under the age of 58 must have an adult with them who is not the patient.  Due to an increase in RSV and influenza rates and associated hospitalizations, children ages 29 and under may not visit patients in [facility] hospitals.   If the patient needs to stay at the hospital during part of their recovery, the visitor guidelines for inpatient rooms apply. Pre-op nurse will coordinate an appropriate time for 1 support person to accompany patient in pre-op.  This support person may not rotate.    Please refer to the [facility] website for the visitor guidelines for Inpatients (after your surgery is over and you are in a regular room).       Your procedure is scheduled on: 11-01-23   Report to [facility] Main Entrance    Report to admitting at 7:00 AM   Call this number if you have problems the morning of surgery [phone]   Do not eat food :After Midnight.   After Midnight you may have the following liquids until 6:30 AM DAY OF SURGERY  Water Non-Citrus Juices (without pulp, NO RED-Apple, White grape, White cranberry) Black Coffee (NO MILK/CREAM OR CREAMERS, sugar ok)  Clear Tea (NO MILK/CREAM OR CREAMERS, sugar ok) regular and decaf                             Plain Jell-O (NO RED)                                           Fruit ices (not with fruit pulp, NO RED)                                     Popsicles (NO RED)                                                               Sports drinks like Gatorade (NO RED)                   The day of surgery:  Drink ONE (1) Pre-Surgery Clear Ensure or G2 by 6:30 AM the morning of surgery. Drink in one sitting. Do not sip.  This drink was given to you during your hospital  pre-op appointment visit. Nothing else to drink after completing the Pre-Surgery  Clear Ensure or G2.          If you have questions, please contact your surgeon's office.   FOLLOW  ANY ADDITIONAL PRE OP INSTRUCTIONS YOU RECEIVED FROM YOUR SURGEON'S OFFICE!!!     Oral Hygiene is also important to reduce your risk of infection.                                    Remember - BRUSH YOUR TEETH THE MORNING OF SURGERY WITH  YOUR REGULAR TOOTHPASTE   Do NOT smoke after Midnight   Take these medicines the morning of surgery with A SIP OF WATER:    Atorvastatin   Omeprazole  Stop all vitamins and herbal supplements 7 days before surgery  Bring CPAP mask and tubing day of surgery.                              You may not have any metal on your body including  jewelry, and body piercing             Do not wear  lotions, powders, cologne, or deodorant              Men may shave face and neck.   Do not bring valuables to the hospital. [facility] IS NOT RESPONSIBLE   FOR VALUABLES.   Contacts, dentures or bridgework may not be worn into surgery.   Bring small overnight bag day of surgery.   DO NOT BRING YOUR HOME MEDICATIONS TO THE HOSPITAL. PHARMACY WILL DISPENSE MEDICATIONS LISTED ON YOUR MEDICATION LIST TO YOU DURING YOUR ADMISSION IN THE HOSPITAL!     Special Instructions: Bring a copy of your healthcare power of attorney and living will documents the day of surgery if you haven't scanned them before.              Please read over the following fact sheets you were given: IF YOU HAVE QUESTIONS ABOUT YOUR PRE-OP INSTRUCTIONS PLEASE CALL [phone] [place]  If you received a COVID test during your pre-op visit  it is requested that you wear a mask when out in public, stay away from anyone that may not be feeling well and notify your surgeon if you develop symptoms. If you test positive for Covid or have been in contact with anyone that has tested positive in the last 10 days please notify you surgeon.    Pre-operative 5 CHG Bath Instructions   You can play a key  role in reducing the risk of infection after surgery. Your skin needs to be as free of germs as possible. You can reduce the number of germs on your skin by washing with CHG (chlorhexidine gluconate) soap before surgery. CHG is an antiseptic soap that kills germs and continues to kill germs even after washing.   DO NOT use if you have an allergy to chlorhexidine/CHG or antibacterial soaps. If your skin becomes reddened or irritated, stop using the CHG and notify one of our RNs at [phone].   Please shower with the CHG soap starting 4 days before surgery using the following schedule:     Please keep in mind the following:  DO NOT shave, including legs and underarms, starting the day of your first shower.   You may shave your face at any point before/day of surgery.  Place clean sheets on your bed the day you start using CHG soap. Use a clean washcloth (not used since being washed) for each shower. DO NOT sleep with pets once you start using the CHG.   CHG Shower Instructions:  If you choose to wash your hair and private area, wash first with your normal shampoo/soap.  After you use shampoo/soap, rinse your hair and body thoroughly to remove shampoo/soap residue.  Turn the water OFF and apply about 3 tablespoons (45 ml) of CHG soap to a CLEAN washcloth.  Apply CHG soap ONLY FROM YOUR NECK DOWN TO YOUR TOES (washing  for 3-5 minutes)  DO NOT use CHG soap on face, private areas, open wounds, or sores.  Pay special attention to the area where your surgery is being performed.  If you are having back surgery, having someone wash your back for you may be helpful. Wait 2 minutes after CHG soap is applied, then you may rinse off the CHG soap.  Pat dry with a clean towel  Put on clean clothes/pajamas   If you choose to wear lotion, please use ONLY the CHG-compatible lotions on the back of this paper.     Additional instructions for the day of surgery: DO NOT APPLY any lotions, deodorants,  cologne, or perfumes.   Put on clean/comfortable clothes.  Brush your teeth.  Ask your nurse before applying any prescription medications to the skin.      CHG Compatible Lotions   Aveeno Moisturizing lotion  Cetaphil Moisturizing Cream  Cetaphil Moisturizing Lotion  Clairol Herbal Essence Moisturizing Lotion, Dry Skin  Clairol Herbal Essence Moisturizing Lotion, Extra Dry Skin  Clairol Herbal Essence Moisturizing Lotion, Normal Skin  Curel Age Defying Therapeutic Moisturizing Lotion with Alpha Hydroxy  Curel Extreme Care Body Lotion  Curel Soothing Hands Moisturizing Hand Lotion  Curel Therapeutic Moisturizing Cream, Fragrance-Free  Curel Therapeutic Moisturizing Lotion, Fragrance-Free  Curel Therapeutic Moisturizing Lotion, Original Formula  Eucerin Daily Replenishing Lotion  Eucerin Dry Skin Therapy Plus Alpha Hydroxy Crme  Eucerin Dry Skin Therapy Plus Alpha Hydroxy Lotion  Eucerin Original Crme  Eucerin Original Lotion  Eucerin Plus Crme Eucerin Plus Lotion  Eucerin TriLipid Replenishing Lotion  Keri Anti-Bacterial Hand Lotion  Keri Deep Conditioning Original Lotion Dry Skin Formula Softly Scented  Keri Deep Conditioning Original Lotion, Fragrance Free Sensitive Skin Formula  Keri Lotion Fast Absorbing Fragrance Free Sensitive Skin Formula  Keri Lotion Fast Absorbing Softly Scented Dry Skin Formula  Keri Original Lotion  Keri Skin Renewal Lotion Keri Silky Smooth Lotion  Keri Silky Smooth Sensitive Skin Lotion  Nivea Body Creamy Conditioning Oil  Nivea Body Extra Enriched Lotion  Nivea Body Original Lotion  Nivea Body Sheer Moisturizing Lotion Nivea Crme  Nivea Skin Firming Lotion  NutraDerm 30 Skin Lotion  NutraDerm Skin Lotion  NutraDerm Therapeutic Skin Cream  NutraDerm Therapeutic Skin Lotion  ProShield Protective Hand Cream  Provon moisturizing lotion   PATIENT SIGNATURE_________________________________  NURSE  SIGNATURE__________________________________  ________________________________________________________________________    Don Johnson  An incentive spirometer is a tool that can help keep your lungs clear and active. This tool measures how well you are filling your lungs with each breath. Taking long deep breaths may help reverse or decrease the chance of developing breathing (pulmonary) problems (especially infection) following: A long period of time when you are unable to move or be active. BEFORE THE PROCEDURE  If the spirometer includes an indicator to show your best effort, your nurse or respiratory therapist will set it to a desired goal. If possible, sit up straight or lean slightly forward. Try not to slouch. Hold the incentive spirometer in an upright position. INSTRUCTIONS FOR USE  Sit on the edge of your bed if possible, or sit up as far as you can in bed or on a chair. Hold the incentive spirometer in an upright position. Breathe out normally. Place the mouthpiece in your mouth and seal your lips tightly around it. Breathe in slowly and as deeply as possible, raising the piston or the ball toward the top of the column. Hold your breath for 3-5 seconds or for as long  as possible. Allow the piston or ball to fall to the bottom of the column. Remove the mouthpiece from your mouth and breathe out normally. Rest for a few seconds and repeat Steps 1 through 7 at least 10 times every 1-2 hours when you are awake. Take your time and take a few normal breaths between deep breaths. The spirometer may include an indicator to show your best effort. Use the indicator as a goal to work toward during each repetition. After each set of 10 deep breaths, practice coughing to be sure your lungs are clear. If you have an incision (the cut made at the time of surgery), support your incision when coughing by placing a pillow or rolled up towels firmly against it. Once you are able to get out of  bed, walk around indoors and cough well. You may stop using the incentive spirometer when instructed by your caregiver.  RISKS AND COMPLICATIONS Take your time so you do not get dizzy or light-headed. If you are in pain, you may need to take or ask for pain medication before doing incentive spirometry. It is harder to take a deep breath if you are having pain. AFTER USE Rest and breathe slowly and easily. It can be helpful to keep track of a log of your progress. Your caregiver can provide you with a simple table to help with this. If you are using the spirometer at home, follow these instructions: SEEK MEDICAL CARE IF:  You are having difficultly using the spirometer. You have trouble using the spirometer as often as instructed. Your pain medication is not giving enough relief while using the spirometer. You develop fever of 100.5 F (38.1 C) or higher. SEEK IMMEDIATE MEDICAL CARE IF:  You cough up bloody sputum that had not been present before. You develop fever of 102 F (38.9 C) or greater. You develop worsening pain at or near the incision site. MAKE SURE YOU:  Understand these instructions. Will watch your condition. Will get help right away if you are not doing well or get worse. Document Released: 01/08/2007 Document Revised: 11/20/2011 Document Reviewed: 03/11/2007 Exit[employer] Patient Information 2014 Exit[employer], [place].   ________________________________________________________________________ WHAT IS A BLOOD TRANSFUSION? Blood Transfusion Information  A transfusion is the replacement of blood or some of its parts. Blood is made up of multiple cells which provide different functions. Red blood cells carry oxygen and are used for blood loss replacement. White blood cells fight against infection. Platelets control bleeding. Plasma helps clot blood. Other blood products are available for specialized needs, such as hemophilia or other clotting disorders. BEFORE THE TRANSFUSION   Who gives blood for transfusions?  Healthy volunteers who are fully evaluated to make sure their blood is safe. This is blood bank blood. Transfusion therapy is the safest it has ever been in the practice of medicine. Before blood is taken from a donor, a complete history is taken to make sure that person has no history of diseases nor engages in risky social behavior (examples are intravenous drug use or sexual activity with multiple partners). The donor's travel history is screened to minimize risk of transmitting infections, such as malaria. The donated blood is tested for signs of infectious diseases, such as HIV and hepatitis. The blood is then tested to be sure it is compatible with you in order to minimize the chance of a transfusion reaction. If you or a relative donates blood, this is often done in anticipation of surgery and is not appropriate for emergency situations.  It takes many days to process the donated blood. RISKS AND COMPLICATIONS Although transfusion therapy is very safe and saves many lives, the main dangers of transfusion include:  Getting an infectious disease. Developing a transfusion reaction. This is an allergic reaction to something in the blood you were given. Every precaution is taken to prevent this. The decision to have a blood transfusion has been considered carefully by your caregiver before blood is given. Blood is not given unless the benefits outweigh the risks. AFTER THE TRANSFUSION Right after receiving a blood transfusion, you will usually feel much better and more energetic. This is especially true if your red blood cells have gotten low (anemic). The transfusion raises the level of the red blood cells which carry oxygen, and this usually causes an energy increase. The nurse administering the transfusion will monitor you carefully for complications. HOME CARE INSTRUCTIONS  No special instructions are needed after a transfusion. You may find your energy is  better. Speak with your caregiver about any limitations on activity for underlying diseases you may have. SEEK MEDICAL CARE IF:  Your condition is not improving after your transfusion. You develop redness or irritation at the intravenous (IV) site. SEEK IMMEDIATE MEDICAL CARE IF:  Any of the following symptoms occur over the next 12 hours: Shaking chills. You have a temperature by mouth above 102 F (38.9 C), not controlled by medicine. Chest, back, or muscle pain. People around you feel you are not acting correctly or are confused. Shortness of breath or difficulty breathing. Dizziness and fainting. You get a rash or develop hives. You have a decrease in urine output. Your urine turns a dark color or changes to pink, red, or brown. Any of the following symptoms occur over the next 10 days: You have a temperature by mouth above 102 F (38.9 C), not controlled by medicine. Shortness of breath. Weakness after normal activity. The white part of the eye turns yellow (jaundice). You have a decrease in the amount of urine or are urinating less often. Your urine turns a dark color or changes to pink, red, or brown. Document Released: 08/25/2000 Document Revised: 11/20/2011 Document Reviewed: 04/13/2008 [facility] Patient Information 2014 [place], [place].  _______________________________________________________________________

## 2023-10-23 NOTE — Progress Notes (Signed)
COVID Vaccine Completed:  Date of COVID positive in last 90 days:  PCP - Sanda Linger, MD Cardiologist -   Chest x-ray - 02-14-23 Epic EKG - 02-21-23 Epic Stress Test -  ECHO -  Cardiac Cath -  Pacemaker/ICD device last checked: Spinal Cord Stimulator:  Bowel Prep -   Sleep Study -  CPAP -   Prediabetes Fasting Blood Sugar -  Checks Blood Sugar _____ times a day  Last dose of GLP1 agonist-  N/A GLP1 instructions:  Hold 7 days before surgery    Last dose of SGLT-2 inhibitors-  N/A SGLT-2 instructions:  Hold 3 days before surgery    Blood Thinner Instructions:  Last dose:   Time: Aspirin Instructions: Last Dose:  Activity level:  Can go up a flight of stairs and perform activities of daily living without stopping and without symptoms of chest pain or shortness of breath.  Able to exercise without symptoms  Unable to go up a flight of stairs without symptoms of     Anesthesia review:   Patient denies shortness of breath, fever, cough and chest pain at PAT appointment  Patient verbalized understanding of instructions that were given to them at the PAT appointment. Patient was also instructed that they will need to review over the PAT instructions again at home before surgery.

## 2023-10-24 ENCOUNTER — Encounter (HOSPITAL_COMMUNITY): Payer: Self-pay

## 2023-10-24 ENCOUNTER — Encounter (HOSPITAL_COMMUNITY)
Admission: RE | Admit: 2023-10-24 | Discharge: 2023-10-24 | Disposition: A | Discharge status: Home / Self Care | Payer: Medicare Other | Source: Ambulatory Visit | Attending: Orthopedic Surgery | Admitting: Orthopedic Surgery

## 2023-10-24 ENCOUNTER — Other Ambulatory Visit: Payer: Self-pay

## 2023-10-24 VITALS — BP 151/84 | HR 62 | Temp 98.0°F | Resp 12 | Ht 66.0 in | Wt 135.8 lb

## 2023-10-24 DIAGNOSIS — I10 Essential (primary) hypertension: Secondary | ICD-10-CM | POA: Diagnosis not present

## 2023-10-24 DIAGNOSIS — Z01818 Encounter for other preprocedural examination: Secondary | ICD-10-CM

## 2023-10-24 DIAGNOSIS — M1612 Unilateral primary osteoarthritis, left hip: Secondary | ICD-10-CM | POA: Diagnosis not present

## 2023-10-24 DIAGNOSIS — Z01812 Encounter for preprocedural laboratory examination: Secondary | ICD-10-CM | POA: Insufficient documentation

## 2023-10-24 HISTORY — DX: Essential (primary) hypertension: I10

## 2023-10-24 LAB — CBC
HCT: 45.5 % (ref 39.0–52.0)
Hemoglobin: 14.8 g/dL (ref 13.0–17.0)
MCH: 30.7 pg (ref 26.0–34.0)
MCHC: 32.5 g/dL (ref 30.0–36.0)
MCV: 94.4 fL (ref 80.0–100.0)
Platelets: 248 10*3/uL (ref 150–400)
RBC: 4.82 MIL/uL (ref 4.22–5.81)
RDW: 12.8 % (ref 11.5–15.5)
WBC: 7.5 10*3/uL (ref 4.0–10.5)
nRBC: 0 % (ref 0.0–0.2)

## 2023-10-24 LAB — BASIC METABOLIC PANEL
Anion gap: 9 (ref 5–15)
BUN: 21 mg/dL (ref 8–23)
CO2: 29 mmol/L (ref 22–32)
Calcium: 9.5 mg/dL (ref 8.9–10.3)
Chloride: 101 mmol/L (ref 98–111)
Creatinine, Ser: 1.01 mg/dL (ref 0.61–1.24)
GFR, Estimated: >60 mL/min (ref >60)
Glucose, Bld: 100 mg/dL — ABNORMAL HIGH (ref 70–99)
Potassium: 4.1 mmol/L (ref 3.5–5.1)
Sodium: 139 mmol/L (ref 135–145)

## 2023-10-24 LAB — SURGICAL PCR SCREEN
MRSA, PCR: NEGATIVE
Staphylococcus aureus: NEGATIVE

## 2023-10-29 NOTE — H&P (Signed)
 TOTAL HIP ADMISSION H&P  Patient is admitted for left total hip arthroplasty.  Therapy Plans: HEP Disposition: Home with wife Planned DVT Prophylaxis: aspirin 81mg  BID DME needed: none PCP: Dr. Yetta Barre - clearance received TXA: IV Allergies: NKDA Anesthesia Concerns: BMI: 23.2 Last HgbA1c: Not diabetic   Other: - Ran out of his BP meds (indapamide) last month > supposed to have an appointment but I asked him to call to have this filled sooner - No hx of VTE or cancer - oxycodone, robaxin, tylenol, celebrex - staying overnight  Subjective:  Chief Complaint: left hip pain  HPI: Don Johnson, 83 y.o. male, has a history of pain and functional disability in the left hip(s) due to arthritis and patient has failed non-surgical conservative treatments for greater than 12 weeks to include NSAID's and/or analgesics and activity modification.  Onset of symptoms was gradual starting 2 years ago with gradually worsening course since that time.The patient noted no past surgery on the left hip(s).  Patient currently rates pain in the left hip at 8 out of 10 with activity. Patient has worsening of pain with activity and weight bearing, pain that interfers with activities of daily living, and pain with passive range of motion. Patient has evidence of joint space narrowing by imaging studies. This condition presents safety issues increasing the risk of falls. There is no current active infection.  Patient Active Problem List   Diagnosis Date Noted   Acute cough 02/14/2023   Bradycardia 09/01/2021   Jet lag syndrome 02/24/2020   Chronic bilateral low back pain without sciatica 09/02/2019   Piriformis syndrome, right 09/30/2018   Essential hypertension 08/28/2018   DDD (degenerative disc disease), cervical 07/29/2015   Prediabetes 06/30/2013   Erectile dysfunction 06/30/2013   Fever 05/19/2013   ESOPHAGEAL STRICTURE 12/14/2008   Gastritis and gastroduodenitis 12/14/2008   BPH associated with  nocturia 12/03/2008   Osteoarthrosis, hand 12/03/2008   Hyperlipidemia with target LDL less than 130 09/28/2008   Past Medical History:  Diagnosis Date   Bilateral recurrent inguinal hernia    BPH with obstruction/lower urinary tract symptoms    Cough 09/20/2016   DDD (degenerative disc disease), cervical    Diverticulosis of colon    GERD (gastroesophageal reflux disease)    Hiatal hernia    History of adenomatous polyp of colon    2006 tubular adenoma;  2010 tubular adenoma and hyperplastic   Hyperlipidemia    Hypertension    OA (osteoarthritis)    hands   Organic sexual dysfunction    Pre-diabetes    S/P dilatation of esophageal stricture    multiple times --  last time w/ balloon 12-31-2015   Wears dentures    upper denture, lower partial   Wears glasses     Past Surgical History:  Procedure Laterality Date   COLONOSCOPY  last one 03-14-2011   ESOPHAGOGASTRODUODENOSCOPY (EGD) WITH ESOPHAGEAL DILATION  last one 12-31-2015   GASTROCNEMIUS RECESSION Right 09/15/2021   Procedure: Gastroc recession;  Surgeon: Toni Arthurs, MD;  Location: [facility];  Service: Orthopedics;  Laterality: Right;   HAMMER TOE SURGERY Right 09/15/2021   Procedure: Right 2-4 metatarsal head resection; hammertoe correction;  Surgeon: Toni Arthurs, MD;  Location:  SURGERY CENTER;  Service: Orthopedics;  Laterality: Right;   HEMORROIDECTOMY  1970's   INGUINAL HERNIA REPAIR Bilateral 1980's   PENILE PROSTHESIS IMPLANT N/A 09/29/2016   Procedure: PENILE PROTHESIS INFLATABLE;  Surgeon: Jethro Bolus, MD;  Location: [facility];  Service: Urology;  Laterality: N/A;   UMBILICAL HERNIA REPAIR  2007 approx    No current facility-administered medications for this encounter.   Current Outpatient Medications  Medication Sig Dispense Refill Last Dose/Taking   atorvastatin (LIPITOR) 40 MG tablet TAKE ONE TABLET BY MOUTH EVERY DAY 90 tablet 0 Taking   indapamide (LOZOL)  1.25 MG tablet Take 1 tablet (1.25 mg total) by mouth daily. 40 tablet 0 Taking   Multiple Vitamins-Minerals (CENTRUM SILVER PO) Take 1 tablet by mouth daily.   Taking   omeprazole (PRILOSEC) 40 MG capsule Take 1 capsule (40 mg total) by mouth daily. 90 capsule 3 Taking   No Known Allergies  Social History   Tobacco Use   Smoking status: Never   Smokeless tobacco: Never  Substance Use Topics   Alcohol use: No    Alcohol/week: 0.0 standard drinks of alcohol    Family History  Problem Relation Age of Onset   Diabetes Mother    Colon polyps Mother    Arthritis Mother    Hypertension Mother    Hyperlipidemia Mother    Diabetes Sister    Leukemia Brother    COPD Brother    Pancreatic cancer Brother    Colon cancer Neg Hx    Stomach cancer Neg Hx      Review of Systems  Constitutional:  Negative for chills and fever.  Respiratory:  Negative for cough and shortness of breath.   Cardiovascular:  Negative for chest pain.  Gastrointestinal:  Negative for nausea and vomiting.  Musculoskeletal:  Positive for arthralgias.     Objective:  Physical Exam Well nourished and well developed. General: Alert and oriented x3, cooperative and pleasant, no acute distress.  Musculoskeletal:  Left hip exam: He has painful limited range of motion of the left hip with hip flexion internal rotation to 5 degrees of pelvic tilting, external rotation to 20 degrees Mild tenderness laterally Slight external rotation contracture with active hip flexion with maintenance of 5/5 strength Right hip exam reveals mild tightness without significant reproducible anterior groin pain with hip flexion internal rotation over 10 degrees with external Tatian to 30 degrees  Calves soft and nontender. Motor function intact in LE. Strength 5/5 LE bilaterally. Neuro: Distal pulses 2+. Sensation to light touch intact in LE.  Vital signs in last 24 hours:    Labs:   Estimated body mass index is 21.92 kg/m as  calculated from the following:   Height as of 10/24/23: 5\' 6"  (1.676 m).   Weight as of 10/24/23: 61.6 kg.   Imaging Review Plain radiographs demonstrate severe degenerative joint disease of the left hip(s). The bone quality appears to be adequate for age and reported activity level.      Assessment/Plan:  End stage arthritis, left hip(s)  The patient history, physical examination, clinical judgement of the provider and imaging studies are consistent with end stage degenerative joint disease of the left hip(s) and total hip arthroplasty is deemed medically necessary. The treatment options including medical management, injection therapy, arthroscopy and arthroplasty were discussed at length. The risks and benefits of total hip arthroplasty were presented and reviewed. The risks due to aseptic loosening, infection, stiffness, dislocation/subluxation,  thromboembolic complications and other imponderables were discussed.  The patient acknowledged the explanation, agreed to proceed with the plan and consent was signed. Patient is being admitted for inpatient treatment for surgery, pain control, PT, OT, prophylactic antibiotics, VTE prophylaxis, progressive ambulation and ADL's and discharge planning.The patient is planning to be  discharged  home.   Rosalene Billings, PA-C Orthopedic Surgery [facility] [place] [phone]

## 2023-11-01 ENCOUNTER — Encounter (HOSPITAL_COMMUNITY)
Admission: RE | Disposition: A | Discharge status: Home / Self Care | Payer: Self-pay | Source: Ambulatory Visit | Attending: Orthopedic Surgery

## 2023-11-01 ENCOUNTER — Other Ambulatory Visit: Payer: Self-pay

## 2023-11-01 ENCOUNTER — Encounter (HOSPITAL_COMMUNITY): Payer: Self-pay | Admitting: Orthopedic Surgery

## 2023-11-01 ENCOUNTER — Observation Stay (HOSPITAL_COMMUNITY)
Admission: RE | Admit: 2023-11-01 | Discharge: 2023-11-02 | Disposition: A | Discharge status: Home / Self Care | Payer: Medicare Other | Source: Ambulatory Visit | Attending: Orthopedic Surgery | Admitting: Orthopedic Surgery

## 2023-11-01 ENCOUNTER — Ambulatory Visit (HOSPITAL_COMMUNITY): Payer: Self-pay

## 2023-11-01 ENCOUNTER — Ambulatory Visit (HOSPITAL_COMMUNITY): Payer: Medicare Other

## 2023-11-01 DIAGNOSIS — I10 Essential (primary) hypertension: Secondary | ICD-10-CM | POA: Insufficient documentation

## 2023-11-01 DIAGNOSIS — Z96642 Presence of left artificial hip joint: Secondary | ICD-10-CM

## 2023-11-01 DIAGNOSIS — Z471 Aftercare following joint replacement surgery: Secondary | ICD-10-CM | POA: Diagnosis not present

## 2023-11-01 DIAGNOSIS — M47816 Spondylosis without myelopathy or radiculopathy, lumbar region: Secondary | ICD-10-CM | POA: Diagnosis not present

## 2023-11-01 DIAGNOSIS — M1612 Unilateral primary osteoarthritis, left hip: Secondary | ICD-10-CM | POA: Diagnosis not present

## 2023-11-01 DIAGNOSIS — Z79899 Other long term (current) drug therapy: Secondary | ICD-10-CM | POA: Diagnosis not present

## 2023-11-01 DIAGNOSIS — M8580 Other specified disorders of bone density and structure, unspecified site: Secondary | ICD-10-CM | POA: Diagnosis not present

## 2023-11-01 HISTORY — PX: TOTAL HIP ARTHROPLASTY: SHX124

## 2023-11-01 LAB — TYPE AND SCREEN
ABO/RH(D): A POS
Antibody Screen: NEGATIVE

## 2023-11-01 LAB — ABO/RH: ABO/RH(D): A POS

## 2023-11-01 SURGERY — ARTHROPLASTY, HIP, TOTAL, ANTERIOR APPROACH
Anesthesia: Spinal | Site: Hip | Laterality: Left

## 2023-11-01 MED ORDER — HYDROMORPHONE HCL 1 MG/ML IJ SOLN: 0.5000 mg | INTRAMUSCULAR | Status: DC | PRN

## 2023-11-01 MED ORDER — DEXAMETHASONE SODIUM PHOSPHATE 10 MG/ML IJ SOLN
10.0000 mg | Freq: Once | INTRAMUSCULAR | Status: AC
  Administered 2023-11-02: 10 mg via INTRAVENOUS
  Filled 2023-11-01: qty 1

## 2023-11-01 MED ORDER — PHENYLEPHRINE HCL-NACL 20-0.9 MG/250ML-% IV SOLN
INTRAVENOUS | Status: DC | PRN
Start: 2023-11-01 — End: 2023-11-01
  Administered 2023-11-01: 30 ug/min via INTRAVENOUS

## 2023-11-01 MED ORDER — SENNA 8.6 MG PO TABS
2.0000 | ORAL_TABLET | Freq: Every day | ORAL | Status: DC
  Administered 2023-11-01: 17.2 mg via ORAL
  Filled 2023-11-01: qty 2

## 2023-11-01 MED ORDER — ATORVASTATIN CALCIUM 40 MG PO TABS
40.0000 mg | ORAL_TABLET | Freq: Every day | ORAL | Status: DC
Start: 2023-11-01 — End: 2023-11-02
  Administered 2023-11-01 – 2023-11-02 (×2): 40 mg via ORAL
  Filled 2023-11-01 (×2): qty 1

## 2023-11-01 MED ORDER — OXYCODONE HCL 5 MG PO TABS
5.0000 mg | ORAL_TABLET | ORAL | Status: DC | PRN
  Administered 2023-11-01 – 2023-11-02 (×3): 5 mg via ORAL
  Filled 2023-11-01: qty 1
  Filled 2023-11-01: qty 2
  Filled 2023-11-01 (×2): qty 1

## 2023-11-01 MED ORDER — SODIUM CHLORIDE 0.9% FLUSH
3.0000 mL | INTRAVENOUS | Status: DC | PRN
Start: 2023-11-01 — End: 2023-11-01

## 2023-11-01 MED ORDER — ASPIRIN 81 MG PO CHEW
81.0000 mg | CHEWABLE_TABLET | Freq: Two times a day (BID) | ORAL | Status: DC
  Administered 2023-11-01 – 2023-11-02 (×2): 81 mg via ORAL
  Filled 2023-11-01 (×2): qty 1

## 2023-11-01 MED ORDER — OXYCODONE HCL 5 MG PO TABS
10.0000 mg | ORAL_TABLET | ORAL | Status: DC | PRN
  Administered 2023-11-02: 15 mg via ORAL
  Administered 2023-11-02: 10 mg via ORAL
  Filled 2023-11-01: qty 3

## 2023-11-01 MED ORDER — KETOROLAC TROMETHAMINE 30 MG/ML IJ SOLN
INTRAMUSCULAR | Status: DC | PRN
  Administered 2023-11-01: 30 mg

## 2023-11-01 MED ORDER — CEFAZOLIN SODIUM-DEXTROSE 2-4 GM/100ML-% IV SOLN
2.0000 g | INTRAVENOUS | Status: AC
  Administered 2023-11-01: 2 g via INTRAVENOUS
  Filled 2023-11-01: qty 100

## 2023-11-01 MED ORDER — ORAL CARE MOUTH RINSE: 15.0000 mL | Freq: Once | OROMUCOSAL | Status: AC

## 2023-11-01 MED ORDER — PROPOFOL 500 MG/50ML IV EMUL
INTRAVENOUS | Status: DC | PRN
  Administered 2023-11-01: 70 ug/kg/min via INTRAVENOUS

## 2023-11-01 MED ORDER — BUPIVACAINE-EPINEPHRINE 0.25% -1:200000 IJ SOLN
INTRAMUSCULAR | Status: AC
  Filled 2023-11-01: qty 1

## 2023-11-01 MED ORDER — KETOROLAC TROMETHAMINE 30 MG/ML IJ SOLN
INTRAMUSCULAR | Status: AC
  Filled 2023-11-01: qty 1

## 2023-11-01 MED ORDER — METOCLOPRAMIDE HCL 5 MG PO TABS: 5.0000 mg | ORAL_TABLET | Freq: Three times a day (TID) | ORAL | Status: DC | PRN

## 2023-11-01 MED ORDER — LACTATED RINGERS IV SOLN
INTRAVENOUS | Status: DC
Start: 2023-11-01 — End: 2023-11-01

## 2023-11-01 MED ORDER — MIDAZOLAM HCL 2 MG/2ML IJ SOLN
INTRAMUSCULAR | Status: AC
  Filled 2023-11-01: qty 2

## 2023-11-01 MED ORDER — FENTANYL CITRATE PF 50 MCG/ML IJ SOSY: 25.0000 ug | PREFILLED_SYRINGE | INTRAMUSCULAR | Status: DC | PRN

## 2023-11-01 MED ORDER — ONDANSETRON HCL 4 MG/2ML IJ SOLN
INTRAMUSCULAR | Status: AC
  Filled 2023-11-01: qty 2

## 2023-11-01 MED ORDER — DIPHENHYDRAMINE HCL 12.5 MG/5ML PO ELIX: 12.5000 mg | ORAL_SOLUTION | ORAL | Status: DC | PRN

## 2023-11-01 MED ORDER — PHENOL 1.4 % MT LIQD: 1.0000 | OROMUCOSAL | Status: DC | PRN

## 2023-11-01 MED ORDER — EPHEDRINE 5 MG/ML INJ
INTRAVENOUS | Status: AC
  Filled 2023-11-01: qty 5

## 2023-11-01 MED ORDER — PHENYLEPHRINE 80 MCG/ML (10ML) SYRINGE FOR IV PUSH (FOR BLOOD PRESSURE SUPPORT)
PREFILLED_SYRINGE | INTRAVENOUS | Status: AC
  Filled 2023-11-01: qty 10

## 2023-11-01 MED ORDER — SODIUM CHLORIDE (PF) 0.9 % IJ SOLN
INTRAMUSCULAR | Status: DC | PRN
Start: 2023-11-01 — End: 2023-11-01
  Administered 2023-11-01: 30 mL

## 2023-11-01 MED ORDER — STERILE WATER FOR IRRIGATION IR SOLN
Status: DC | PRN
  Administered 2023-11-01: 1000 mL

## 2023-11-01 MED ORDER — OXYCODONE HCL 5 MG PO TABS: 5.0000 mg | ORAL_TABLET | Freq: Once | ORAL | Status: DC | PRN

## 2023-11-01 MED ORDER — ACETAMINOPHEN 160 MG/5ML PO SOLN: 1000.0000 mg | Freq: Once | ORAL | Status: DC | PRN

## 2023-11-01 MED ORDER — POVIDONE-IODINE 10 % EX SWAB: 2.0000 | Freq: Once | CUTANEOUS | Status: DC

## 2023-11-01 MED ORDER — ACETAMINOPHEN 500 MG PO TABS
1000.0000 mg | ORAL_TABLET | Freq: Four times a day (QID) | ORAL | Status: DC
  Administered 2023-11-01 – 2023-11-02 (×4): 1000 mg via ORAL
  Filled 2023-11-01 (×4): qty 2

## 2023-11-01 MED ORDER — ALBUMIN HUMAN 5 % IV SOLN
12.5000 g | Freq: Once | INTRAVENOUS | Status: AC
  Administered 2023-11-01: 12.5 g via INTRAVENOUS

## 2023-11-01 MED ORDER — CHLORHEXIDINE GLUCONATE 0.12 % MT SOLN
15.0000 mL | Freq: Once | OROMUCOSAL | Status: AC
  Administered 2023-11-01: 15 mL via OROMUCOSAL

## 2023-11-01 MED ORDER — ACETAMINOPHEN 10 MG/ML IV SOLN: 1000.0000 mg | Freq: Once | INTRAVENOUS | Status: DC | PRN

## 2023-11-01 MED ORDER — METHOCARBAMOL 500 MG PO TABS
500.0000 mg | ORAL_TABLET | Freq: Four times a day (QID) | ORAL | Status: DC | PRN
  Administered 2023-11-02 (×2): 500 mg via ORAL
  Filled 2023-11-01 (×2): qty 1

## 2023-11-01 MED ORDER — TRANEXAMIC ACID-NACL 1000-0.7 MG/100ML-% IV SOLN
1000.0000 mg | Freq: Once | INTRAVENOUS | Status: AC
  Administered 2023-11-01: 1000 mg via INTRAVENOUS
  Filled 2023-11-01: qty 100

## 2023-11-01 MED ORDER — OXYCODONE HCL 5 MG/5ML PO SOLN: 5.0000 mg | Freq: Once | ORAL | Status: DC | PRN

## 2023-11-01 MED ORDER — CELECOXIB 200 MG PO CAPS
200.0000 mg | ORAL_CAPSULE | Freq: Two times a day (BID) | ORAL | Status: DC
  Administered 2023-11-01 – 2023-11-02 (×3): 200 mg via ORAL
  Filled 2023-11-01 (×3): qty 1

## 2023-11-01 MED ORDER — ONDANSETRON HCL 4 MG/2ML IJ SOLN
INTRAMUSCULAR | Status: DC | PRN
  Administered 2023-11-01: 4 mg via INTRAVENOUS

## 2023-11-01 MED ORDER — POLYETHYLENE GLYCOL 3350 17 G PO PACK
17.0000 g | PACK | Freq: Two times a day (BID) | ORAL | Status: DC
  Administered 2023-11-01 – 2023-11-02 (×2): 17 g via ORAL
  Filled 2023-11-01 (×2): qty 1

## 2023-11-01 MED ORDER — ALUM & MAG HYDROXIDE-SIMETH 200-200-20 MG/5ML PO SUSP: 30.0000 mL | ORAL | Status: DC | PRN

## 2023-11-01 MED ORDER — PANTOPRAZOLE SODIUM 40 MG PO TBEC
40.0000 mg | DELAYED_RELEASE_TABLET | Freq: Every day | ORAL | Status: DC
  Administered 2023-11-01 – 2023-11-02 (×2): 40 mg via ORAL
  Filled 2023-11-01 (×2): qty 1

## 2023-11-01 MED ORDER — HEMOSTATIC AGENTS (NO CHARGE) OPTIME
TOPICAL | Status: DC | PRN
  Administered 2023-11-01: 1 via TOPICAL

## 2023-11-01 MED ORDER — INDAPAMIDE 1.25 MG PO TABS
1.2500 mg | ORAL_TABLET | Freq: Every day | ORAL | Status: DC
  Filled 2023-11-01 (×2): qty 1

## 2023-11-01 MED ORDER — DEXAMETHASONE SODIUM PHOSPHATE 10 MG/ML IJ SOLN
INTRAMUSCULAR | Status: AC
  Filled 2023-11-01: qty 1

## 2023-11-01 MED ORDER — ONDANSETRON HCL 4 MG PO TABS: 4.0000 mg | ORAL_TABLET | Freq: Four times a day (QID) | ORAL | Status: DC | PRN

## 2023-11-01 MED ORDER — MENTHOL 3 MG MT LOZG: 1.0000 | LOZENGE | OROMUCOSAL | Status: DC | PRN

## 2023-11-01 MED ORDER — METOCLOPRAMIDE HCL 5 MG/ML IJ SOLN
5.0000 mg | Freq: Three times a day (TID) | INTRAMUSCULAR | Status: DC | PRN
  Filled 2023-11-01: qty 2

## 2023-11-01 MED ORDER — SODIUM CHLORIDE 0.9 % IV SOLN: INTRAVENOUS | Status: DC

## 2023-11-01 MED ORDER — BISACODYL 10 MG RE SUPP: 10.0000 mg | Freq: Every day | RECTAL | Status: DC | PRN

## 2023-11-01 MED ORDER — CEFAZOLIN SODIUM-DEXTROSE 2-4 GM/100ML-% IV SOLN
2.0000 g | Freq: Four times a day (QID) | INTRAVENOUS | Status: AC
  Administered 2023-11-01 (×2): 2 g via INTRAVENOUS
  Filled 2023-11-01 (×2): qty 100

## 2023-11-01 MED ORDER — SODIUM CHLORIDE 0.9% FLUSH: 3.0000 mL | Freq: Two times a day (BID) | INTRAVENOUS | Status: DC

## 2023-11-01 MED ORDER — BUPIVACAINE-EPINEPHRINE (PF) 0.25% -1:200000 IJ SOLN
INTRAMUSCULAR | Status: DC | PRN
  Administered 2023-11-01: 30 mL via PERINEURAL

## 2023-11-01 MED ORDER — TRANEXAMIC ACID-NACL 1000-0.7 MG/100ML-% IV SOLN
1000.0000 mg | INTRAVENOUS | Status: AC
  Administered 2023-11-01: 1000 mg via INTRAVENOUS
  Filled 2023-11-01: qty 100

## 2023-11-01 MED ORDER — DEXAMETHASONE SODIUM PHOSPHATE 10 MG/ML IJ SOLN
8.0000 mg | Freq: Once | INTRAMUSCULAR | Status: AC
  Administered 2023-11-01: 8 mg via INTRAVENOUS

## 2023-11-01 MED ORDER — BUPIVACAINE IN DEXTROSE 0.75-8.25 % IT SOLN
INTRATHECAL | Status: DC | PRN
  Administered 2023-11-01: 1.8 mL via INTRATHECAL

## 2023-11-01 MED ORDER — ONDANSETRON HCL 4 MG/2ML IJ SOLN: 4.0000 mg | Freq: Four times a day (QID) | INTRAMUSCULAR | Status: DC | PRN

## 2023-11-01 MED ORDER — SODIUM CHLORIDE (PF) 0.9 % IJ SOLN
INTRAMUSCULAR | Status: AC
  Filled 2023-11-01: qty 30

## 2023-11-01 MED ORDER — FENTANYL CITRATE (PF) 250 MCG/5ML IJ SOLN
INTRAMUSCULAR | Status: DC | PRN
Start: 2023-11-01 — End: 2023-11-01
  Administered 2023-11-01: 25 ug via INTRAVENOUS
  Administered 2023-11-01: 50 ug via INTRAVENOUS
  Administered 2023-11-01: 25 ug via INTRAVENOUS

## 2023-11-01 MED ORDER — ACETAMINOPHEN 500 MG PO TABS: 1000.0000 mg | ORAL_TABLET | Freq: Once | ORAL | Status: DC | PRN

## 2023-11-01 MED ORDER — FENTANYL CITRATE (PF) 100 MCG/2ML IJ SOLN
INTRAMUSCULAR | Status: AC
  Filled 2023-11-01: qty 2

## 2023-11-01 MED ORDER — 0.9 % SODIUM CHLORIDE (POUR BTL) OPTIME
TOPICAL | Status: DC | PRN
  Administered 2023-11-01: 1000 mL

## 2023-11-01 MED ORDER — ALBUMIN HUMAN 5 % IV SOLN
INTRAVENOUS | Status: AC
  Filled 2023-11-01: qty 250

## 2023-11-01 MED ORDER — METHOCARBAMOL 1000 MG/10ML IJ SOLN: 500.0000 mg | Freq: Four times a day (QID) | INTRAMUSCULAR | Status: DC | PRN

## 2023-11-01 SURGICAL SUPPLY — 41 items
BAG COUNTER SPONGE SURGICOUNT (BAG) IMPLANT
BAG ZIPLOCK 12X15 (MISCELLANEOUS) IMPLANT
BLADE SAG 18X100X1.27 (BLADE) ×2 IMPLANT
COVER PERINEAL POST (MISCELLANEOUS) ×2 IMPLANT
COVER SURGICAL LIGHT HANDLE (MISCELLANEOUS) ×2 IMPLANT
CUP ACETBLR 54 OD PINNACLE (Hips) IMPLANT
DERMABOND ADVANCED .7 DNX12 (GAUZE/BANDAGES/DRESSINGS) ×2 IMPLANT
DRAPE FOOT SWITCH (DRAPES) ×2 IMPLANT
DRAPE STERI IOBAN 125X83 (DRAPES) ×2 IMPLANT
DRAPE U-SHAPE 47X51 STRL (DRAPES) ×4 IMPLANT
DRESSING AQUACEL AG SP 3.5X10 (GAUZE/BANDAGES/DRESSINGS) ×2 IMPLANT
DRSG AQUACEL AG ADV 3.5X10 (GAUZE/BANDAGES/DRESSINGS) IMPLANT
DRSG AQUACEL AG SP 3.5X10 (GAUZE/BANDAGES/DRESSINGS) ×1 IMPLANT
DURAPREP 26ML APPLICATOR (WOUND CARE) ×2 IMPLANT
ELECT REM PT RETURN 15FT ADLT (MISCELLANEOUS) ×2 IMPLANT
GLOVE BIO SURGEON STRL SZ 6 (GLOVE) ×2 IMPLANT
GLOVE BIOGEL PI IND STRL 6.5 (GLOVE) ×2 IMPLANT
GLOVE BIOGEL PI IND STRL 7.5 (GLOVE) ×2 IMPLANT
GLOVE ORTHO TXT STRL SZ7.5 (GLOVE) ×4 IMPLANT
GOWN STRL REUS W/ TWL LRG LVL3 (GOWN DISPOSABLE) ×4 IMPLANT
HEAD M SROM 36MM PLUS 1.5 (Hips) IMPLANT
HEMOSTAT SPONGE AVITENE ULTRA (HEMOSTASIS) IMPLANT
HOLDER FOLEY CATH W/STRAP (MISCELLANEOUS) ×2 IMPLANT
KIT TURNOVER KIT A (KITS) IMPLANT
LINER NEUTRAL 54X36MM PLUS 4 (Hips) IMPLANT
MANIFOLD NEPTUNE II (INSTRUMENTS) ×2 IMPLANT
NDL SAFETY ECLIPSE 18X1.5 (NEEDLE) IMPLANT
PACK ANTERIOR HIP CUSTOM (KITS) ×2 IMPLANT
SCREW 6.5MMX30MM (Screw) IMPLANT
SROM M HEAD 36MM PLUS 1.5 (Hips) ×1 IMPLANT
STEM FEMORAL SZ6 HIGH ACTIS (Stem) IMPLANT
SUT MNCRL AB 4-0 PS2 18 (SUTURE) ×2 IMPLANT
SUT STRATAFIX 0 PDS 27 VIOLET (SUTURE) ×1 IMPLANT
SUT VIC AB 1 CT1 36 (SUTURE) ×6 IMPLANT
SUT VIC AB 2-0 CT1 TAPERPNT 27 (SUTURE) ×4 IMPLANT
SUTURE STRATFX 0 PDS 27 VIOLET (SUTURE) ×2 IMPLANT
SYR 3ML LL SCALE MARK (SYRINGE) IMPLANT
TOWEL GREEN STERILE FF (TOWEL DISPOSABLE) ×2 IMPLANT
TRAY FOLEY MTR SLVR 16FR STAT (SET/KITS/TRAYS/PACK) ×2 IMPLANT
TUBE SUCTION HIGH CAP CLEAR NV (SUCTIONS) ×2 IMPLANT
WATER STERILE IRR 1000ML POUR (IV SOLUTION) ×2 IMPLANT

## 2023-11-01 NOTE — Transfer of Care (Signed)
 Immediate Anesthesia Transfer of Care Note  Patient: Don Johnson  Procedure(s) Performed: TOTAL HIP ARTHROPLASTY ANTERIOR APPROACH (Left: Hip)  Patient Location: PACU  Anesthesia Type:MAC and Spinal  Level of Consciousness: awake and patient cooperative  Airway & Oxygen Therapy: Patient Spontanous Breathing and Patient connected to face mask oxygen  Post-op Assessment: Report given to RN and Post -op Vital signs reviewed and stable  Post vital signs: Reviewed and stable  Last Vitals:  Vitals Value Taken Time  BP 99/70 11/01/23 1115  Temp    Pulse 77 11/01/23 1120  Resp 13 11/01/23 1120  SpO2 100 % 11/01/23 1120  Vitals shown include unfiled device data.  Last Pain:  Vitals:   11/01/23 0733  TempSrc:   PainSc: 0-No pain         Complications: No notable events documented.

## 2023-11-01 NOTE — Op Note (Signed)
 NAME:  Don Johnson                ACCOUNT NO.: [id number]      MEDICAL RECORD NO.: [id number]      FACILITY:  [facility] [place]      PHYSICIAN:  Shelda Pal  DATE OF BIRTH:  [birth year]     DATE OF PROCEDURE:  11/01/2023                                 OPERATIVE REPORT         PREOPERATIVE DIAGNOSIS: Left  hip osteoarthritis.      POSTOPERATIVE DIAGNOSIS:  Left hip osteoarthritis.      PROCEDURE:  Left total hip replacement through an anterior approach   utilizing DePuy THR system, component size 54 mm pinnacle cup, a size 36+4 neutral   Altrex liner, a size 6 Hi Actis stem with a 36+1.5 Articuleze metal head ball      SURGEON:  Madlyn Frankel. Charlann Boxer, M.D.      ASSISTANT:  Rosalene Billings, PA-C     ANESTHESIA:  Spinal.      SPECIMENS:  None.      COMPLICATIONS:  None.      BLOOD LOSS:  1200 cc     DRAINS:  None.      INDICATION OF THE PROCEDURE:  Don Johnson is a 83 y.o. male who had   presented to office for evaluation of left hip pain.  Radiographs revealed   progressive degenerative changes with bone-on-bone   articulation of the  hip joint, including subchondral cystic changes and osteophytes.  The patient had painful limited range of   motion significantly affecting their overall quality of life and function.  The patient was failing to    respond to conservative measures including medications and/or injections and activity modification and at this point was ready   to proceed with more definitive measures.  Consent was obtained for   benefit of pain relief.  Specific risks of infection, DVT, component   failure, dislocation, neurovascular injury, and need for revision surgery were reviewed in the office.     PROCEDURE IN DETAIL:  The patient was brought to operative theater.   Once adequate anesthesia, preoperative antibiotics, 2 gm of Ancef, 1 gm of Tranexamic Acid, and 10 mg of Decadron were administered, the patient was positioned supine on the [employer] table.  Once the patient was safely positioned with adequate padding of boney prominences we predraped out the hip, and used fluoroscopy to confirm orientation of the pelvis.      The left hip was then prepped and draped from proximal iliac crest to   mid thigh with a shower curtain technique.      Time-out was performed identifying the patient, planned procedure, and the appropriate extremity.     An incision was then made 2 cm lateral to the   anterior superior iliac spine extending over the orientation of the   tensor fascia lata muscle and sharp dissection was carried down to the   fascia of the muscle.      The fascia was then incised.  The muscle belly was identified and swept   laterally and retractor placed along the superior neck.  Following   cauterization of the circumflex vessels and removing some pericapsular   fat, a second cobra retractor was placed on the inferior neck.  A T-capsulotomy was made along the line of the   superior neck to the trochanteric fossa, then extended proximally and   distally.  Tag sutures were placed and the retractors were then placed   intracapsular.  We then identified the trochanteric fossa and   orientation of my neck cut and then made a neck osteotomy with the femur on traction.  The femoral   head was removed without difficulty or complication.  Traction was let   off and retractors were placed posterior and anterior around the   acetabulum. During the exposure he was noted to have tightness particularly inferiorly along the anterior inferior capsule during exposure of the acetabulum.  In an effort to help exposure of the acetabulum as well as release this area used the Bovie to go through the capsule inferiorly down to the transverse acetabular ligament.  During this exposure procedure we did encounter a branch of the obturator artery.  This did take some time to cauterize using a combination of Bovie cautery with a tonsil as well as  placement of Ultrafoam hemostatic agent as well as pressure.  It was noted that at the end of the case at the time of closure that this area had significantly stopped oozing to the point of a normal procedure.  This accounted for the vast majority of the blood loss in addition to standard osseous bleeding as well as general soft tissue oozing.     The labrum and foveal tissue were debrided.  I began reaming with a 47 mm   reamer and reamed up to 53 mm reamer with good bony bed preparation and a 54 mm  cup was chosen.  The final 54 mm Pinnacle cup was then impacted under fluoroscopy to confirm the depth of penetration and orientation with respect to   Abduction and forward flexion.  A screw was placed into the ilium followed by the hole eliminator.  The final   36+4 neutral Altrex liner was impacted with good visualized rim fit.  The cup was positioned anatomically within the acetabular portion of the pelvis.      At this point, the femur was rolled to 100 degrees.  Further capsule was   released off the inferior aspect of the femoral neck.  I then   released the superior capsule proximally.  With the leg in a neutral position the hook was placed laterally   along the femur under the vastus lateralis origin and elevated manually and then held in position using the hook attachment on the bed.  The leg was then extended and adducted with the leg rolled to 100   degrees of external rotation.  Retractors were placed along the medial calcar and posteriorly over the greater trochanter.  Once the proximal femur was fully   exposed, I used a box osteotome to set orientation.  I then began   broaching with the starting chili pepper broach and passed this by hand and then broached up to 6.  With the 6 broach in place I chose a high offset neck and did several trial reductions.  The offset was appropriate, leg lengths   appeared to be equal best matched with the +1.5 head ball trial confirmed radiographically.    Given these findings, I went ahead and dislocated the hip, repositioned all   retractors and positioned the right hip in the extended and abducted position.  The final 6 Hi Actis stem was   chosen and it was impacted down to the  level of neck cut.  Based on this   and the trial reductions, a final 36+1.5 Articuleze metal head ball was chosen and   impacted onto a clean and dry trunnion, and the hip was reduced.  The   hip had been irrigated throughout the case again at this point.  I did   reapproximate the superior capsular leaflet to the anterior leaflet   using #1 Vicryl.  The fascia of the   tensor fascia lata muscle was then reapproximated using #1 Vicryl and #0 Stratafix sutures.  The   remaining wound was closed with 2-0 Vicryl and running 4-0 Monocryl.   The hip was cleaned, dried, and dressed sterilely using Dermabond and   Aquacel dressing.  The patient was then brought   to recovery room in stable condition tolerating the procedure well.    Rosalene Billings, PA-C was present for the entirety of the case involved from   preoperative positioning, perioperative retractor management, general   facilitation of the case, as well as primary wound closure as assistant.            Madlyn Frankel Charlann Boxer, M.D.        11/01/2023 8:15 AM

## 2023-11-01 NOTE — Anesthesia Preprocedure Evaluation (Signed)
 Anesthesia Evaluation  Patient identified by MRN, date of birth, ID band Patient awake    Reviewed: Allergy & Precautions, NPO status , Patient's Chart, lab work & pertinent test results  History of Anesthesia Complications Negative for: history of anesthetic complications  Airway Mallampati: III  TM Distance: >3 FB Neck ROM: Full    Dental  (+) Edentulous Upper, Upper Dentures,    Pulmonary neg pulmonary ROS, neg shortness of breath, neg sleep apnea, neg COPD, neg recent URI   breath sounds clear to auscultation       Cardiovascular hypertension, (-) angina (-) Past MI and (-) CHF  Rhythm:Regular     Neuro/Psych negative neurological ROS  negative psych ROS   GI/Hepatic Neg liver ROS, hiatal hernia,GERD  Medicated and Controlled,,  Endo/Other  negative endocrine ROS    Renal/GU Lab Results      Component                Value               Date                      NA                       139                 10/24/2023                K                        4.1                 10/24/2023                CO2                      29                  10/24/2023                GLUCOSE                  100 (H)             10/24/2023                BUN                      21                  10/24/2023                CREATININE               1.01                10/24/2023                CALCIUM                  9.5                 10/24/2023                GFR                      74.71  02/14/2023                GFRNONAA                 >60                 10/24/2023                Musculoskeletal  (+) Arthritis ,    Abdominal   Peds  Hematology negative hematology ROS (+) Lab Results      Component                Value               Date                      WBC                      7.5                 10/24/2023                HGB                      14.8                10/24/2023                HCT                       45.5                10/24/2023                MCV                      94.4                10/24/2023                PLT                      248                 10/24/2023              Anesthesia Other Findings Denies blood thinners  Reproductive/Obstetrics                             Anesthesia Physical Anesthesia Plan  ASA: 2  Anesthesia Plan: Spinal   Post-op Pain Management:    Induction: Intravenous  PONV Risk Score and Plan: 1 and Ondansetron, Propofol infusion and Treatment may vary due to age or medical condition  Airway Management Planned: Nasal Cannula, Natural Airway and Simple Face Mask  Additional Equipment: None  Intra-op Plan:   Post-operative Plan:   Informed Consent: I have reviewed the patients History and Physical, chart, labs and discussed the procedure including the risks, benefits and alternatives for the proposed anesthesia with the patient or authorized representative who has indicated his/her understanding and acceptance.     Dental advisory given  Plan Discussed with: CRNA  Anesthesia Plan Comments:        Anesthesia Quick Evaluation

## 2023-11-01 NOTE — Anesthesia Procedure Notes (Signed)
 Spinal  Patient location during procedure: OR Start time: 11/01/2023 9:41 AM End time: 11/01/2023 9:45 AM Reason for block: surgical anesthesia Staffing Performed: anesthesiologist  Anesthesiologist: Val Eagle, MD Performed by: Val Eagle, MD Authorized by: Val Eagle, MD   Preanesthetic Checklist Completed: patient identified, IV checked, risks and benefits discussed, surgical consent, monitors and equipment checked, pre-op evaluation and timeout performed Spinal Block Patient position: sitting Prep: DuraPrep Patient monitoring: heart rate, cardiac monitor, continuous pulse ox and blood pressure Approach: midline Location: L3-4 Injection technique: single-shot Needle Needle type: Pencan  Needle gauge: 24 G Needle length: 9 cm Assessment Sensory level: T6 Events: CSF return

## 2023-11-01 NOTE — Discharge Instructions (Signed)

## 2023-11-01 NOTE — Interval H&P Note (Signed)
 History and Physical Interval Note:  11/01/2023 8:15 AM  Don Johnson  has presented today for surgery, with the diagnosis of Left hip osteoarthritis.  The various methods of treatment have been discussed with the patient and family. After consideration of risks, benefits and other options for treatment, the patient has consented to  Procedure(s): TOTAL HIP ARTHROPLASTY ANTERIOR APPROACH (Left) as a surgical intervention.  The patient's history has been reviewed, patient examined, no change in status, stable for surgery.  I have reviewed the patient's chart and labs.  Questions were answered to the patient's satisfaction.     Shelda Pal

## 2023-11-01 NOTE — Evaluation (Signed)
 Physical Therapy Evaluation Patient Details Name: Don Johnson MRN: [id number] DOB: Sep 25, 1940 Today's Date: 11/01/2023  History of Present Illness  Pt s/p L THR and with hx of BPH and DDD  Clinical Impression  Pt s/p L THR and presents with decreased L LE strength/ROM and post op pain limiting functional mobility.  Pt should progress to dc home with family assist.        If plan is discharge home, recommend the following: A little help with walking and/or transfers;A little help with bathing/dressing/bathroom;Assistance with cooking/housework;Assist for transportation;Help with stairs or ramp for entrance   Can travel by private vehicle        Equipment Recommendations None recommended by PT  Recommendations for Other Services       Functional Status Assessment Patient has had a recent decline in their functional status and demonstrates the ability to make significant improvements in function in a reasonable and predictable amount of time.     Precautions / Restrictions Precautions Precautions: Fall Recall of Precautions/Restrictions: Intact Restrictions Weight Bearing Restrictions Per Provider Order: Yes LLE Weight Bearing Per Provider Order: Weight bearing as tolerated      Mobility  Bed Mobility Overal bed mobility: Needs Assistance Bed Mobility: Sit to Supine, Supine to Sit     Supine to sit: Min assist, +2 for physical assistance, +2 for safety/equipment Sit to supine: Min assist, +2 for physical assistance, +2 for safety/equipment   General bed mobility comments: Increased time with cues for sequence and use of R LE to self assist    Transfers Overall transfer level: Needs assistance Equipment used: Rolling walker (2 wheels) Transfers: Sit to/from Stand, Bed to chair/wheelchair/BSC Sit to Stand: Min assist, +2 safety/equipment   Step pivot transfers: Min assist, +2 safety/equipment       General transfer comment: cues for LE management and use of UEs  to self assist; stp pvt BSC to bed    Ambulation/Gait Ambulation/Gait assistance: Min assist Gait Distance (Feet): 28 Feet Assistive device: Rolling walker (2 wheels) Gait Pattern/deviations: Step-to pattern, Decreased step length - right, Decreased step length - left, Shuffle, Trunk flexed Gait velocity: decr     General Gait Details: cues for sequence, posture and position from [employer]            Wheelchair Mobility     Tilt Bed    Modified Rankin (Stroke Patients Only)       Balance Overall balance assessment: Needs assistance Sitting-balance support: No upper extremity supported, Feet supported Sitting balance-Leahy Scale: Fair     Standing balance support: Bilateral upper extremity supported Standing balance-Leahy Scale: Poor                               Pertinent Vitals/Pain Pain Assessment Pain Assessment: 0-10 Pain Score: 5  Pain Location: L hip/groin Pain Descriptors / Indicators: Aching, Sore Pain Intervention(s): Limited activity within patient's tolerance, Monitored during session, Premedicated before session, Ice applied    Home Living Family/patient expects to be discharged to:: Private residence Living Arrangements: Spouse/significant other Available Help at Discharge: Family;Available 24 hours/day (Wife is limited in ability to help but son will stay initially) Type of Home: House Home Access: Stairs to enter   [employer] of Steps: 1+1   Home Layout: One level Home Equipment: Agricultural consultant (2 wheels);Cane - single point;Toilet riser;Shower seat      Prior Function Prior Level of Function : Independent/Modified Independent  Extremity/Trunk Assessment   Upper Extremity Assessment Upper Extremity Assessment: Overall WFL for tasks assessed    Lower Extremity Assessment Lower Extremity Assessment: LLE deficits/detail    Cervical / Trunk Assessment Cervical / Trunk Assessment:  Normal  Communication   Communication Communication: No apparent difficulties    Cognition Arousal: Alert Behavior During Therapy: WFL for tasks assessed/performed   PT - Cognitive impairments: No apparent impairments                         Following commands: Intact       Cueing Cueing Techniques: Verbal cues     General Comments      Exercises Total Joint Exercises Ankle Circles/Pumps: AROM, Both, 15 reps, Supine   Assessment/Plan    PT Assessment Patient needs continued PT services  PT Problem List Decreased strength;Decreased range of motion;Decreased activity tolerance;Decreased balance;Decreased mobility;Decreased knowledge of use of DME;Pain       PT Treatment Interventions DME instruction;Gait training;Stair training;Functional mobility training;Therapeutic activities;Therapeutic exercise;Balance training;Patient/family education    PT Goals (Current goals can be found in the Care Plan section)  Acute Rehab PT Goals Patient Stated Goal: REgain IND PT Goal Formulation: With patient Time For Goal Achievement: 11/08/23 Potential to Achieve Goals: Good    Frequency 7X/week     Co-evaluation               AM-PAC PT "6 Clicks" Mobility  Outcome Measure Help needed turning from your back to your side while in a flat bed without using bedrails?: A Little Help needed moving from lying on your back to sitting on the side of a flat bed without using bedrails?: A Little Help needed moving to and from a bed to a chair (including a wheelchair)?: A Little Help needed standing up from a chair using your arms (e.g., wheelchair or bedside chair)?: A Little Help needed to walk in hospital room?: A Little Help needed climbing 3-5 steps with a railing? : A Lot 6 Click Score: 17    End of Session Equipment Utilized During Treatment: Gait belt Activity Tolerance: Patient tolerated treatment well;Patient limited by fatigue Patient left: in bed;with call  bell/phone within reach;with bed alarm set;with family/visitor present Nurse Communication: Mobility status PT Visit Diagnosis: Difficulty in walking, not elsewhere classified (R26.2)    Time: 1308-6578 PT Time Calculation (min) (ACUTE ONLY): 26 min   Charges:   PT Evaluation $PT Eval Low Complexity: 1 Low PT Treatments $Gait Training: 8-22 mins PT General Charges $$ ACUTE PT VISIT: 1 Visit         Mauro Kaufmann PT Acute Rehabilitation Services Pager [phone] Office [phone]   Jefferie Holston 11/01/2023, 4:38 PM

## 2023-11-01 NOTE — Care Plan (Signed)
 Ortho Bundle Case Management Note  Patient Details  Name: Don Johnson MRN: [id number] Date of Birth: [birth year]                  L THA on 20-20-25 DCP: Home with wife DME: No needs, has a RW PT: HEP   DME Arranged:  N/A DME Agency:       Additional Comments: Please contact me with any questions of if this plan should need to change.   Ennis Forts, RN,CCM [facility]  [phone] 11/01/2023, 7:26 AM

## 2023-11-01 NOTE — Plan of Care (Signed)
 Problem: Education: Goal: Knowledge of General Education information will improve Description: Including pain rating scale, medication(s)/side effects and non-pharmacologic comfort measures Outcome: Progressing   Problem: Clinical Measurements: Goal: Ability to maintain clinical measurements within normal limits will improve Outcome: Progressing   Problem: Activity: Goal: Risk for activity intolerance will decrease Outcome: Progressing   Problem: Coping: Goal: Level of anxiety will decrease Outcome: Progressing   Problem: Pain Managment: Goal: General experience of comfort will improve and/or be controlled Outcome: Progressing   Problem: Safety: Goal: Ability to remain free from injury will improve Outcome: Progressing  Haydee Salter, RN 11/01/23 4:39 PM

## 2023-11-02 ENCOUNTER — Encounter (HOSPITAL_COMMUNITY): Payer: Self-pay | Admitting: Orthopedic Surgery

## 2023-11-02 DIAGNOSIS — M1612 Unilateral primary osteoarthritis, left hip: Secondary | ICD-10-CM | POA: Diagnosis not present

## 2023-11-02 DIAGNOSIS — Z79899 Other long term (current) drug therapy: Secondary | ICD-10-CM | POA: Diagnosis not present

## 2023-11-02 DIAGNOSIS — I10 Essential (primary) hypertension: Secondary | ICD-10-CM | POA: Diagnosis not present

## 2023-11-02 LAB — CBC
HCT: 27.3 % — ABNORMAL LOW (ref 39.0–52.0)
Hemoglobin: 9 g/dL — ABNORMAL LOW (ref 13.0–17.0)
MCH: 31.1 pg (ref 26.0–34.0)
MCHC: 33 g/dL (ref 30.0–36.0)
MCV: 94.5 fL (ref 80.0–100.0)
Platelets: 170 10*3/uL (ref 150–400)
RBC: 2.89 MIL/uL — ABNORMAL LOW (ref 4.22–5.81)
RDW: 12.7 % (ref 11.5–15.5)
WBC: 15.9 10*3/uL — ABNORMAL HIGH (ref 4.0–10.5)
nRBC: 0 % (ref 0.0–0.2)

## 2023-11-02 LAB — BASIC METABOLIC PANEL
Anion gap: 11 (ref 5–15)
BUN: 27 mg/dL — ABNORMAL HIGH (ref 8–23)
CO2: 26 mmol/L (ref 22–32)
Calcium: 7.5 mg/dL — ABNORMAL LOW (ref 8.9–10.3)
Chloride: 96 mmol/L — ABNORMAL LOW (ref 98–111)
Creatinine, Ser: 1.16 mg/dL (ref 0.61–1.24)
GFR, Estimated: >60 mL/min (ref >60)
Glucose, Bld: 134 mg/dL — ABNORMAL HIGH (ref 70–99)
Potassium: 4 mmol/L (ref 3.5–5.1)
Sodium: 133 mmol/L — ABNORMAL LOW (ref 135–145)

## 2023-11-02 MED ORDER — FERROUS SULFATE 325 (65 FE) MG PO TABS: 325.0000 mg | ORAL_TABLET | Freq: Two times a day (BID) | ORAL | 0 refills | Status: DC

## 2023-11-02 MED ORDER — CELECOXIB 200 MG PO CAPS: 200.0000 mg | ORAL_CAPSULE | Freq: Every day | ORAL | 0 refills | Status: DC

## 2023-11-02 MED ORDER — SENNA 8.6 MG PO TABS: 2.0000 | ORAL_TABLET | Freq: Every day | ORAL | 0 refills | Status: AC

## 2023-11-02 MED ORDER — OXYCODONE HCL 5 MG PO TABS
2.5000 mg | ORAL_TABLET | ORAL | 0 refills | Status: DC | PRN
Start: 2023-11-02 — End: 2024-01-23

## 2023-11-02 MED ORDER — TRANEXAMIC ACID 650 MG PO TABS
1950.0000 mg | ORAL_TABLET | Freq: Every day | ORAL | Status: DC
  Administered 2023-11-02: 1950 mg via ORAL
  Filled 2023-11-02: qty 3

## 2023-11-02 MED ORDER — METHOCARBAMOL 500 MG PO TABS: 500.0000 mg | ORAL_TABLET | Freq: Four times a day (QID) | ORAL | 2 refills | Status: DC | PRN

## 2023-11-02 MED ORDER — ASPIRIN 81 MG PO CHEW: 81.0000 mg | CHEWABLE_TABLET | Freq: Two times a day (BID) | ORAL | 0 refills | Status: AC

## 2023-11-02 MED ORDER — POLYETHYLENE GLYCOL 3350 17 G PO PACK: 17.0000 g | PACK | Freq: Two times a day (BID) | ORAL | 0 refills | Status: DC

## 2023-11-02 NOTE — Progress Notes (Signed)
   Subjective: 1 Day Post-Op Procedure(s) (LRB): TOTAL HIP ARTHROPLASTY ANTERIOR APPROACH (Left) Patient reports pain as mild.   Patient seen in rounds for Dr. Charlann Boxer. Patient is resting in bed on exam this morning. No acute events overnight. Foley catheter removed. Patient ambulated 28 feet with PT yesterday. We will start therapy today.   Objective: Vital signs in last 24 hours: Temp:  [97.6 F (36.4 C)-99 F (37.2 C)] 98.1 F (36.7 C) (02/21 0643) Pulse Rate:  [56-89] 66 (02/21 0643) Resp:  [12-18] 17 (02/21 0643) BP: (94-126)/(50-83) 113/56 (02/21 0643) SpO2:  [93 %-100 %] 100 % (02/21 0643) Weight:  [61.6 kg] 61.6 kg (02/20 0733)  Intake/Output from previous day:  Intake/Output Summary (Last 24 hours) at 11/02/2023 0726 Last data filed at 11/02/2023 1610 Gross per 24 hour  Intake 1861.89 ml  Output 2025 ml  Net -163.11 ml     Intake/Output this shift: No intake/output data recorded.  Labs: Recent Labs    11/02/23 0334  HGB 9.0*   Recent Labs    11/02/23 0334  WBC 15.9*  RBC 2.89*  HCT 27.3*  PLT 170   Recent Labs    11/02/23 0334  NA 133*  K 4.0  CL 96*  CO2 26  BUN 27*  CREATININE 1.16  GLUCOSE 134*  CALCIUM 7.5*   No results for input(s): "LABPT", "INR" in the last 72 hours.  Exam: General - Patient is Alert and Oriented Extremity - Neurologically intact Neurovascular intact Sensation intact distally Intact pulses distally Dorsiflexion/Plantar flexion intact Foot warm to the touch Dressing - dressing C/D/I Motor Function - intact, moving foot and toes well on exam.   Past Medical History:  Diagnosis Date   Bilateral recurrent inguinal hernia    BPH with obstruction/lower urinary tract symptoms    Cough 09/20/2016   DDD (degenerative disc disease), cervical    Diverticulosis of colon    GERD (gastroesophageal reflux disease)    Hiatal hernia    History of adenomatous polyp of colon    2006 tubular adenoma;  2010 tubular adenoma and  hyperplastic   Hyperlipidemia    Hypertension    OA (osteoarthritis)    hands   Organic sexual dysfunction    Pre-diabetes    S/P dilatation of esophageal stricture    multiple times --  last time w/ balloon 12-31-2015   Wears dentures    upper denture, lower partial   Wears glasses     Assessment/Plan: 1 Day Post-Op Procedure(s) (LRB): TOTAL HIP ARTHROPLASTY ANTERIOR APPROACH (Left) Principal Problem:   S/P total left hip arthroplasty  Estimated body mass index is 21.92 kg/m as calculated from the following:   Height as of this encounter: 5\' 6"  (1.676 m).   Weight as of this encounter: 61.6 kg. Advance diet Up with therapy D/C IV fluids  DVT Prophylaxis - Aspirin Weight bearing as tolerated.  ABLA: Hgb stable at 9 this AM, down from 14.8 at baseline.  Blood pressure is stable. He did not feel dizzy or lightheaded when getting up yesterday.  Will give oral TXA today per Dr. Charlann Boxer Will send home on iron  Plan is to go Home after hospital stay. Plan for discharge today after meeting goals with therapy. Follow up in the office in 2 weeks.   Rosalene Billings, PA-C Orthopedic Surgery [phone] 11/02/2023, 7:26 AM

## 2023-11-02 NOTE — Progress Notes (Signed)
 Physical Therapy Treatment Patient Details Name: Don Johnson MRN: [id number] DOB: Aug 03, 1941 Today's Date: 11/02/2023   History of Present Illness Pt s/p L THR and with hx of BPH and DDD    PT Comments  Pt continues motivated and progressing well with mobility.  Pt up to ambulate increased distance in hall, negotiated stairs, reviewed dressing techniques, reviewed car transfers, and reviewed written therex program.  Son present for majority of session.  Pt eager for dc home this date.    If plan is discharge home, recommend the following: A little help with walking and/or transfers;A little help with bathing/dressing/bathroom;Assistance with cooking/housework;Assist for transportation;Help with stairs or ramp for entrance   Can travel by private vehicle        Equipment Recommendations  None recommended by PT    Recommendations for Other Services       Precautions / Restrictions Precautions Precautions: Fall Recall of Precautions/Restrictions: Intact Restrictions Weight Bearing Restrictions Per Provider Order: Yes LLE Weight Bearing Per Provider Order: Weight bearing as tolerated     Mobility  Bed Mobility Overal bed mobility: Needs Assistance Bed Mobility: Supine to Sit     Supine to sit: Supervision Sit to supine: Contact guard assist   General bed mobility comments: Increased time with cues for sequence and use of R LE to self assist    Transfers Overall transfer level: Needs assistance Equipment used: Rolling walker (2 wheels) Transfers: Sit to/from Stand Sit to Stand: Contact guard assist, Supervision           General transfer comment: cues for LE management and use of UEs to self assist    Ambulation/Gait Ambulation/Gait assistance: Contact guard assist, Supervision Gait Distance (Feet): 140 Feet Assistive device: Rolling walker (2 wheels) Gait Pattern/deviations: Step-to pattern, Decreased step length - right, Decreased step length - left,  Shuffle, Trunk flexed Gait velocity: decr     General Gait Details: cues for sequence, posture and position from RW   Stairs Stairs: Yes Stairs assistance: Min assist Stair Management: No rails, Step to pattern, Forwards, With walker Number of Stairs: 2 General stair comments: single step twice with RW and cues for sequence   Wheelchair Mobility     Tilt Bed    Modified Rankin (Stroke Patients Only)       Balance Overall balance assessment: Needs assistance Sitting-balance support: No upper extremity supported, Feet supported Sitting balance-Leahy Scale: Good     Standing balance support: No upper extremity supported Standing balance-Leahy Scale: Fair                              Hotel manager: No apparent difficulties  Cognition Arousal: Alert Behavior During Therapy: WFL for tasks assessed/performed   PT - Cognitive impairments: No apparent impairments                         Following commands: Intact      Cueing Cueing Techniques: Verbal cues  Exercises Total Joint Exercises Ankle Circles/Pumps: AROM, Both, 15 reps, Supine Quad Sets: AROM, 10 reps, Both, Supine Heel Slides: AAROM, Left, 20 reps, Supine Hip ABduction/ADduction: AAROM, Left, 15 reps, Supine Long Arc Quad: AAROM, Left, 10 reps, Seated    General Comments        Pertinent Vitals/Pain Pain Assessment Pain Assessment: 0-10 Pain Score: 5  Pain Location: L hip/groin Pain Descriptors / Indicators: Aching, Sore Pain Intervention(s): Monitored during session,  Limited activity within patient's tolerance, Premedicated before session, Ice applied    Home Living                          Prior Function            PT Goals (current goals can now be found in the care plan section) Acute Rehab PT Goals Patient Stated Goal: REgain IND PT Goal Formulation: With patient Time For Goal Achievement: 11/08/23 Potential to Achieve  Goals: Good Progress towards PT goals: Progressing toward goals    Frequency    7X/week      PT Plan      Co-evaluation              AM-PAC PT "6 Clicks" Mobility   Outcome Measure  Help needed turning from your back to your side while in a flat bed without using bedrails?: A Little Help needed moving from lying on your back to sitting on the side of a flat bed without using bedrails?: A Little Help needed moving to and from a bed to a chair (including a wheelchair)?: A Little Help needed standing up from a chair using your arms (e.g., wheelchair or bedside chair)?: A Little Help needed to walk in hospital room?: A Little Help needed climbing 3-5 steps with a railing? : A Little 6 Click Score: 18    End of Session Equipment Utilized During Treatment: Gait belt Activity Tolerance: Patient tolerated treatment well Patient left: in bed;with call bell/phone within reach;with bed alarm set Nurse Communication: Mobility status PT Visit Diagnosis: Difficulty in walking, not elsewhere classified (R26.2)     Time: 1027-2536 PT Time Calculation (min) (ACUTE ONLY): 24 min  Charges:    $Gait Training: 8-22 mins $Therapeutic Exercise: 8-22 mins $Therapeutic Activity: 8-22 mins PT General Charges $$ ACUTE PT VISIT: 1 Visit                     Mauro Kaufmann PT Acute Rehabilitation Services Pager [phone] Office [phone]    Don Johnson 11/02/2023, 12:50 PM

## 2023-11-02 NOTE — Care Management Obs Status (Signed)
 MEDICARE OBSERVATION STATUS NOTIFICATION   Patient Details  Name: SHANTEL WESELY MRN: [id number] Date of Birth: [birth year]   Medicare Observation Status Notification Given:  Yes    Howell Rucks, RN 11/02/2023, 9:42 AM

## 2023-11-02 NOTE — Anesthesia Postprocedure Evaluation (Signed)
 Anesthesia Post Note  Patient: Don Johnson  Procedure(s) Performed: TOTAL HIP ARTHROPLASTY ANTERIOR APPROACH (Left: Hip)     Patient location during evaluation: PACU Anesthesia Type: Spinal and MAC Level of consciousness: awake and alert Pain management: pain level controlled Vital Signs Assessment: post-procedure vital signs reviewed and stable Respiratory status: spontaneous breathing, nonlabored ventilation and respiratory function stable Cardiovascular status: stable and blood pressure returned to baseline Postop Assessment: no apparent nausea or vomiting Anesthetic complications: no   No notable events documented.               Caleesi Kohl

## 2023-11-02 NOTE — TOC Transition Note (Signed)
 Transition of Care [facility]) - Discharge Note   Patient Details  Name: Don Johnson MRN: [id number] Date of Birth: [birth year]  Transition of [facility]) CM/SW Contact:  Howell Rucks, RN Phone Number: 11/02/2023, 10:00 AM   Clinical Narrative:   Met with pt at bedside to review discharge therapy follow and and home DME needs, pt confirmed HEP, has RW, no home DME needs. No TOC needs.     Final next level of care: Home/Self Care Barriers to Discharge: No Barriers Identified   Patient Goals and CMS Choice Patient states their goals for this hospitalization and ongoing recovery are:: return home          Discharge Placement                       Discharge Plan and Services Additional resources added to the After Visit Summary for                  DME Arranged: N/A                    Social Drivers of Health (SDOH) Interventions SDOH Screenings   Food Insecurity: No Food Insecurity (11/01/2023)  Housing: Low Risk  (11/01/2023)  Transportation Needs: No Transportation Needs (11/01/2023)  Utilities: Not At Risk (11/01/2023)  Alcohol Screen: Low Risk  (05/19/2022)  Depression (PHQ2-9): Low Risk  (02/14/2023)  Financial Resource Strain: Low Risk  (05/19/2022)  Physical Activity: Sufficiently Active (05/19/2022)  Social Connections: Moderately Integrated (11/01/2023)  Stress: No Stress Concern Present (05/19/2022)  Tobacco Use: Low Risk  (11/01/2023)     Readmission Risk Interventions     No data to display

## 2023-11-02 NOTE — Progress Notes (Signed)
 Physical Therapy Treatment Patient Details Name: Don Johnson MRN: [id number] DOB: September 24, 1940 Today's Date: 11/02/2023   History of Present Illness Pt s/p L THR and with hx of BPH and DDD    PT Comments  Pt motivated and progressing well.  Pt up to ambulate increased distance in hall and HEP initiated.   If plan is discharge home, recommend the following: A little help with walking and/or transfers;A little help with bathing/dressing/bathroom;Assistance with cooking/housework;Assist for transportation;Help with stairs or ramp for entrance   Can travel by private vehicle        Equipment Recommendations  None recommended by PT    Recommendations for Other Services       Precautions / Restrictions Precautions Precautions: Fall Recall of Precautions/Restrictions: Intact Restrictions Weight Bearing Restrictions Per Provider Order: Yes LLE Weight Bearing Per Provider Order: Weight bearing as tolerated     Mobility  Bed Mobility Overal bed mobility: Needs Assistance Bed Mobility: Sit to Supine       Sit to supine: Contact guard assist   General bed mobility comments: Increased time with cues for sequence and use of R LE to self assist    Transfers Overall transfer level: Needs assistance Equipment used: Rolling walker (2 wheels) Transfers: Sit to/from Stand Sit to Stand: Min assist, Contact guard assist           General transfer comment: cues for LE management and use of UEs to self assist; stp pvt BSC to bed    Ambulation/Gait Ambulation/Gait assistance: Min assist, Contact guard assist Gait Distance (Feet): 75 Feet Assistive device: Rolling walker (2 wheels) Gait Pattern/deviations: Step-to pattern, Decreased step length - right, Decreased step length - left, Shuffle, Trunk flexed Gait velocity: decr     General Gait Details: cues for sequence, posture and position from [employer]             Wheelchair Mobility     Tilt Bed    Modified  Rankin (Stroke Patients Only)       Balance Overall balance assessment: Needs assistance Sitting-balance support: No upper extremity supported, Feet supported Sitting balance-Leahy Scale: Good     Standing balance support: Single extremity supported Standing balance-Leahy Scale: Poor                              Communication Communication Communication: No apparent difficulties  Cognition Arousal: Alert Behavior During Therapy: WFL for tasks assessed/performed   PT - Cognitive impairments: No apparent impairments                         Following commands: Intact      Cueing Cueing Techniques: Verbal cues  Exercises Total Joint Exercises Ankle Circles/Pumps: AROM, Both, 15 reps, Supine Quad Sets: AROM, 10 reps, Both, Supine Heel Slides: AAROM, Left, 20 reps, Supine Hip ABduction/ADduction: AAROM, Left, 15 reps, Supine Long Arc Quad: AAROM, Left, 10 reps, Seated    General Comments        Pertinent Vitals/Pain Pain Assessment Pain Assessment: 0-10 Pain Score: 6  Pain Location: L hip/groin Pain Descriptors / Indicators: Aching, Sore Pain Intervention(s): Premedicated before session, Monitored during session, Limited activity within patient's tolerance, Ice applied    Home Living                          Prior Function  PT Goals (current goals can now be found in the care plan section) Acute Rehab PT Goals Patient Stated Goal: REgain IND PT Goal Formulation: With patient Time For Goal Achievement: 11/08/23 Potential to Achieve Goals: Good Progress towards PT goals: Progressing toward goals    Frequency    7X/week      PT Plan      Co-evaluation              AM-PAC PT "6 Clicks" Mobility   Outcome Measure  Help needed turning from your back to your side while in a flat bed without using bedrails?: A Little Help needed moving from lying on your back to sitting on the side of a flat bed without  using bedrails?: A Little Help needed moving to and from a bed to a chair (including a wheelchair)?: A Little Help needed standing up from a chair using your arms (e.g., wheelchair or bedside chair)?: A Little Help needed to walk in hospital room?: A Little Help needed climbing 3-5 steps with a railing? : A Lot 6 Click Score: 17    End of Session Equipment Utilized During Treatment: Gait belt Activity Tolerance: Patient tolerated treatment well Patient left: in bed;with call bell/phone within reach;with bed alarm set Nurse Communication: Mobility status PT Visit Diagnosis: Difficulty in walking, not elsewhere classified (R26.2)     Time: 1610-9604 PT Time Calculation (min) (ACUTE ONLY): 27 min  Charges:    $Gait Training: 8-22 mins $Therapeutic Exercise: 8-22 mins PT General Charges $$ ACUTE PT VISIT: 1 Visit                     Mauro Kaufmann PT Acute Rehabilitation Services Pager [phone] Office [phone]    Niko Penson 11/02/2023, 12:44 PM

## 2023-11-13 NOTE — Discharge Summary (Signed)
 Patient ID: Don Johnson MRN: [id number] DOB/AGE: 04/27/1941 83 y.o.  Admit date: 11/01/2023 Discharge date: 11/02/2023  Admission Diagnoses:  Left hip osteoarthritis  Discharge Diagnoses:  Principal Problem:   S/P total left hip arthroplasty   Past Medical History:  Diagnosis Date   Bilateral recurrent inguinal hernia    BPH with obstruction/lower urinary tract symptoms    Cough 09/20/2016   DDD (degenerative disc disease), cervical    Diverticulosis of colon    GERD (gastroesophageal reflux disease)    Hiatal hernia    History of adenomatous polyp of colon    2006 tubular adenoma;  2010 tubular adenoma and hyperplastic   Hyperlipidemia    Hypertension    OA (osteoarthritis)    hands   Organic sexual dysfunction    Pre-diabetes    S/P dilatation of esophageal stricture    multiple times --  last time w/ balloon 12-31-2015   Wears dentures    upper denture, lower partial   Wears glasses     Surgeries: Procedure(s): TOTAL HIP ARTHROPLASTY ANTERIOR APPROACH on 11/01/2023   Consultants:   Discharged Condition: Improved  Hospital Course: WELLES WALTHALL is an 83 y.o. male who was admitted 11/01/2023 for operative treatment ofS/P total left hip arthroplasty. Patient has severe unremitting pain that affects sleep, daily activities, and work/hobbies. After pre-op clearance the patient was taken to the operating room on 11/01/2023 and underwent  Procedure(s): TOTAL HIP ARTHROPLASTY ANTERIOR APPROACH.    Patient was given perioperative antibiotics:  Anti-infectives (From admission, onward)    Start     Dose/Rate Route Frequency Ordered Stop   11/01/23 1600  ceFAZolin (ANCEF) IVPB 2g/100 mL premix        2 g 200 mL/hr over 30 Minutes Intravenous Every 6 hours 11/01/23 1335 11/02/23 0801   11/01/23 0730  ceFAZolin (ANCEF) IVPB 2g/100 mL premix        2 g 200 mL/hr over 30 Minutes Intravenous On call to O.R. 11/01/23 4098 11/01/23 0953        Patient was given  sequential compression devices, early ambulation, and chemoprophylaxis to prevent DVT. Patient worked with PT and was meeting their goals regarding safe ambulation and transfers.  Patient benefited maximally from hospital stay and there were no complications.    Recent vital signs: No data found.   Recent laboratory studies: No results for input(s): "WBC", "HGB", "HCT", "PLT", "NA", "K", "CL", "CO2", "BUN", "CREATININE", "GLUCOSE", "INR", "CALCIUM" in the last 72 hours.  Invalid input(s): "PT", "2"   Discharge Medications:   Allergies as of 11/02/2023   No Known Allergies      Medication List     TAKE these medications    aspirin 81 MG chewable tablet Chew 1 tablet (81 mg total) by mouth 2 (two) times daily for 28 days.   atorvastatin 40 MG tablet Commonly known as: LIPITOR TAKE ONE TABLET BY MOUTH EVERY DAY   celecoxib 200 MG capsule Commonly known as: CELEBREX Take 1 capsule (200 mg total) by mouth daily.   CENTRUM SILVER PO Take 1 tablet by mouth daily.   ferrous sulfate 325 (65 FE) MG tablet Take 1 tablet (325 mg total) by mouth 2 (two) times daily with a meal for 21 days. Take for two weeks as tolerated.   indapamide 1.25 MG tablet Commonly known as: LOZOL Take 1 tablet (1.25 mg total) by mouth daily.   methocarbamol 500 MG tablet Commonly known as: ROBAXIN Take 1 tablet (500 mg total) by mouth  every 6 (six) hours as needed for muscle spasms.   omeprazole 40 MG capsule Commonly known as: PRILOSEC Take 1 capsule (40 mg total) by mouth daily.   oxyCODONE 5 MG immediate release tablet Commonly known as: Oxy IR/ROXICODONE Take 0.5-1 tablets (2.5-5 mg total) by mouth every 4 (four) hours as needed for severe pain (pain score 7-10).   polyethylene glycol 17 g packet Commonly known as: MIRALAX / GLYCOLAX Take 17 g by mouth 2 (two) times daily.   senna 8.6 MG Tabs tablet Commonly known as: SENOKOT Take 2 tablets (17.2 mg total) by mouth at bedtime for 14  days.               Discharge Care Instructions  (From admission, onward)           Start     Ordered   11/02/23 0000  Change dressing       Comments: Maintain surgical dressing until follow up in the clinic. If the edges start to pull up, may reinforce with tape. If the dressing is no longer working, may remove and cover with gauze and tape, but must keep the area dry and clean.  Call with any questions or concerns.   11/02/23 0731            Diagnostic Studies: DG Pelvis Portable Result Date: 11/01/2023 CLINICAL DATA:  [id number] S/P total hip arthroplasty [id number] EXAM: PORTABLE PELVIS 1-2 VIEWS COMPARISON:  November 01, 2023 FINDINGS: Status post LEFT hip arthroplasty. Orthopedic hardware is intact and without periprosthetic fracture or lucency. Soft tissue air. Penile prosthesis. Osteopenia. Degenerative changes of the lower lumbar spine. IMPRESSION: Expected postsurgical appearance status post hip arthroplasty. Electronically Signed   By: Meda Klinefelter M.D.   On: 11/01/2023 13:40   DG HIP UNILAT WITH PELVIS 1V LEFT Result Date: 11/01/2023 CLINICAL DATA:  Elective surgery. Anterior total left hip arthroplasty. Intraoperative fluoroscopy. EXAM: DG HIP (WITH OR WITHOUT PELVIS) 1V*L* COMPARISON:  Pelvis and right hip radiographs 09/30/2018 FINDINGS: Images were performed intraoperatively without the presence of a radiologist. Severe superior left femoroacetabular joint space narrowing. Penile prosthesis again noted. The patient is undergoing total left hip arthroplasty. No hardware complication is seen. Total fluoroscopy images: 10 Total fluoroscopy time: 15 seconds Total dose: Radiation Exposure Index (as provided by the fluoroscopic device): 1.39 mGy air Kerma Please see intraoperative findings for further detail. IMPRESSION: Intraoperative fluoroscopy for total left hip arthroplasty. Electronically Signed   By: Neita Garnet M.D.   On: 11/01/2023 11:24   DG C-Arm 1-60 Min-No  Report Result Date: 11/01/2023 Fluoroscopy was utilized by the requesting physician.  No radiographic interpretation.   DG C-Arm 1-60 Min-No Report Result Date: 11/01/2023 Fluoroscopy was utilized by the requesting physician.  No radiographic interpretation.    Disposition: Discharge disposition: 01-Home or Self Care       Discharge Instructions     Call MD / Call 911   Complete by: As directed    If you experience chest pain or shortness of breath, CALL 911 and be transported to the hospital emergency room.  If you develope a fever above 101 F, pus (white drainage) or increased drainage or redness at the wound, or calf pain, call your surgeon's office.   Change dressing   Complete by: As directed    Maintain surgical dressing until follow up in the clinic. If the edges start to pull up, may reinforce with tape. If the dressing is no longer working, may remove and cover with  gauze and tape, but must keep the area dry and clean.  Call with any questions or concerns.   Constipation Prevention   Complete by: As directed    Drink plenty of fluids.  Prune juice may be helpful.  You may use a stool softener, such as Colace (over the counter) 100 mg twice a day.  Use MiraLax (over the counter) for constipation as needed.   Diet - low sodium heart healthy   Complete by: As directed    Increase activity slowly as tolerated   Complete by: As directed    Weight bearing as tolerated with assist device (walker, cane, etc) as directed, use it as long as suggested by your surgeon or therapist, typically at least 4-6 weeks.   Post-operative opioid taper instructions:   Complete by: As directed    POST-OPERATIVE OPIOID TAPER INSTRUCTIONS: It is important to wean off of your opioid medication as soon as possible. If you do not need pain medication after your surgery it is ok to stop day one. Opioids include: Codeine, Hydrocodone(Norco, Vicodin), Oxycodone(Percocet, oxycontin) and hydromorphone  amongst others.  Long term and even short term use of opiods can cause: Increased pain response Dependence Constipation Depression Respiratory depression And more.  Withdrawal symptoms can include Flu like symptoms Nausea, vomiting And more Techniques to manage these symptoms Hydrate well Eat regular healthy meals Stay active Use relaxation techniques(deep breathing, meditating, yoga) Do Not substitute Alcohol to help with tapering If you have been on opioids for less than two weeks and do not have pain than it is ok to stop all together.  Plan to wean off of opioids This plan should start within one week post op of your joint replacement. Maintain the same interval or time between taking each dose and first decrease the dose.  Cut the total daily intake of opioids by one tablet each day Next start to increase the time between doses. The last dose that should be eliminated is the evening dose.      TED hose   Complete by: As directed    Use stockings (TED hose) for 2 weeks on both leg(s).  You may remove them at night for sleeping.        Follow-up Information     Durene Romans, MD. Go on 11/14/2023.   Specialty: Orthopedic Surgery Why: You are scheduled for first post op appt on Wednesday March 5 at 3:00pm. Contact information: [address] [place] [address] [phone]                  Signed: Cassandria Anger 11/13/2023, 4:11 PM

## 2023-11-21 ENCOUNTER — Ambulatory Visit: Payer: Medicare Other | Admitting: Internal Medicine

## 2023-11-21 ENCOUNTER — Encounter: Payer: Self-pay | Admitting: Internal Medicine

## 2023-11-21 VITALS — BP 130/60 | HR 88 | Temp 97.9°F | Ht 66.0 in | Wt 138.2 lb

## 2023-11-21 DIAGNOSIS — D649 Anemia, unspecified: Secondary | ICD-10-CM | POA: Insufficient documentation

## 2023-11-21 DIAGNOSIS — R7303 Prediabetes: Secondary | ICD-10-CM | POA: Diagnosis not present

## 2023-11-21 DIAGNOSIS — D508 Other iron deficiency anemias: Secondary | ICD-10-CM

## 2023-11-21 DIAGNOSIS — E785 Hyperlipidemia, unspecified: Secondary | ICD-10-CM

## 2023-11-21 DIAGNOSIS — I10 Essential (primary) hypertension: Secondary | ICD-10-CM

## 2023-11-21 LAB — CBC WITH DIFFERENTIAL/PLATELET
Basophils Absolute: 0.1 10*3/uL (ref 0.0–0.1)
Basophils Relative: 0.8 % (ref 0.0–3.0)
Eosinophils Absolute: 0.2 10*3/uL (ref 0.0–0.7)
Eosinophils Relative: 1.7 % (ref 0.0–5.0)
HCT: 34.8 % — ABNORMAL LOW (ref 39.0–52.0)
Hemoglobin: 11.4 g/dL — ABNORMAL LOW (ref 13.0–17.0)
Lymphocytes Relative: 18.7 % (ref 12.0–46.0)
Lymphs Abs: 2 10*3/uL (ref 0.7–4.0)
MCHC: 32.8 g/dL (ref 30.0–36.0)
MCV: 93.7 fl (ref 78.0–100.0)
Monocytes Absolute: 1.2 10*3/uL — ABNORMAL HIGH (ref 0.1–1.0)
Monocytes Relative: 11.1 % (ref 3.0–12.0)
Neutro Abs: 7.3 10*3/uL (ref 1.4–7.7)
Neutrophils Relative %: 67.7 % (ref 43.0–77.0)
Platelets: 420 10*3/uL — ABNORMAL HIGH (ref 150.0–400.0)
RBC: 3.71 Mil/uL — ABNORMAL LOW (ref 4.22–5.81)
RDW: 14 % (ref 11.5–15.5)
WBC: 10.7 10*3/uL — ABNORMAL HIGH (ref 4.0–10.5)

## 2023-11-21 LAB — LIPID PANEL
Cholesterol: 131 mg/dL (ref 0–200)
HDL: 50.1 mg/dL (ref >39.00)
LDL Cholesterol: 65 mg/dL (ref 0–99)
NonHDL: 81.32
Total CHOL/HDL Ratio: 3
Triglycerides: 84 mg/dL (ref 0.0–149.0)
VLDL: 16.8 mg/dL (ref 0.0–40.0)

## 2023-11-21 LAB — HEPATIC FUNCTION PANEL
ALT: 11 U/L (ref 0–53)
AST: 17 U/L (ref 0–37)
Albumin: 4.3 g/dL (ref 3.5–5.2)
Alkaline Phosphatase: 141 U/L — ABNORMAL HIGH (ref 39–117)
Bilirubin, Direct: 0.1 mg/dL (ref 0.0–0.3)
Total Bilirubin: 0.5 mg/dL (ref 0.2–1.2)
Total Protein: 7.3 g/dL (ref 6.0–8.3)

## 2023-11-21 LAB — BASIC METABOLIC PANEL
BUN: 23 mg/dL (ref 6–23)
CO2: 31 meq/L (ref 19–32)
Calcium: 9.8 mg/dL (ref 8.4–10.5)
Chloride: 99 meq/L (ref 96–112)
Creatinine, Ser: 1.04 mg/dL (ref 0.40–1.50)
GFR: 66.66 mL/min (ref >60.00)
Glucose, Bld: 135 mg/dL — ABNORMAL HIGH (ref 70–99)
Potassium: 4 meq/L (ref 3.5–5.1)
Sodium: 137 meq/L (ref 135–145)

## 2023-11-21 LAB — IBC + FERRITIN
Ferritin: 128.1 ng/mL (ref 22.0–322.0)
Iron: 24 ug/dL — ABNORMAL LOW (ref 42–165)
Saturation Ratios: 7.6 % — ABNORMAL LOW (ref 20.0–50.0)
TIBC: 315 ug/dL (ref 250.0–450.0)
Transferrin: 225 mg/dL (ref 212.0–360.0)

## 2023-11-21 LAB — TSH: TSH: 3.43 u[IU]/mL (ref 0.35–5.50)

## 2023-11-21 LAB — HEMOGLOBIN A1C: Hgb A1c MFr Bld: 5.8 % (ref 4.6–6.5)

## 2023-11-21 MED ORDER — ACCRUFER 30 MG PO CAPS: 1.0000 | ORAL_CAPSULE | Freq: Two times a day (BID) | ORAL | 0 refills | Status: DC

## 2023-11-21 MED ORDER — INDAPAMIDE 1.25 MG PO TABS
1.2500 mg | ORAL_TABLET | Freq: Every day | ORAL | 0 refills | Status: DC
Start: 2023-11-21 — End: 2024-03-05

## 2023-11-21 NOTE — Progress Notes (Unsigned)
 Subjective:  Patient ID: Don Johnson, male    DOB: 02-14-1941  Age: 83 y.o. MRN: [id number]  CC: Anemia and Hypertension   HPI Don Johnson presents for f/up ---  Discussed the use of AI scribe software for clinical note transcription with the patient, who gave verbal consent to proceed.  History of Present Illness   The patient presents for follow-up after recent hip surgery.  He recently underwent hip surgery and is staying fairly active. He uses a walker at home and a cane when outside, indicating some mobility limitations post-surgery. He recalls losing blood during the surgery, which required an extended stay in the recovery room.  Post-surgery, he has experienced swelling in his ankles and legs, which has mostly subsided. He also notes bruising on his leg and foot, attributing it to the surgery. He experienced nausea last Friday through Sunday but did not vomit. No abdominal pain or diarrhea, and the nausea has resolved. No chest pain, shortness of breath, dizziness, or lightheadedness.  He is taking iron pills due to blood loss from the surgery. He mentions needing to take other medications alongside the iron pills, but specifics are not provided. No changes in his medication regimen since the surgery. He is not currently taking any pain medications, including Celebrex, muscle relaxers, or oxycodone. He is taking omeprazole for indigestion.       Outpatient Medications Prior to Visit  Medication Sig Dispense Refill   aspirin 81 MG chewable tablet Chew 1 tablet (81 mg total) by mouth 2 (two) times daily for 28 days. 56 tablet 0   atorvastatin (LIPITOR) 40 MG tablet TAKE ONE TABLET BY MOUTH EVERY DAY 90 tablet 0   celecoxib (CELEBREX) 200 MG capsule Take 1 capsule (200 mg total) by mouth daily. 30 capsule 0   methocarbamol (ROBAXIN) 500 MG tablet Take 1 tablet (500 mg total) by mouth every 6 (six) hours as needed for muscle spasms. 40 tablet 2   Multiple Vitamins-Minerals  (CENTRUM SILVER PO) Take 1 tablet by mouth daily.     omeprazole (PRILOSEC) 40 MG capsule Take 1 capsule (40 mg total) by mouth daily. 90 capsule 3   oxyCODONE (OXY IR/ROXICODONE) 5 MG immediate release tablet Take 0.5-1 tablets (2.5-5 mg total) by mouth every 4 (four) hours as needed for severe pain (pain score 7-10). 30 tablet 0   polyethylene glycol (MIRALAX / GLYCOLAX) 17 g packet Take 17 g by mouth 2 (two) times daily. 14 each 0   ferrous sulfate 325 (65 FE) MG tablet Take 1 tablet (325 mg total) by mouth 2 (two) times daily with a meal for 21 days. Take for two weeks as tolerated. 42 tablet 0   indapamide (LOZOL) 1.25 MG tablet Take 1 tablet (1.25 mg total) by mouth daily. 40 tablet 0   No facility-administered medications prior to visit.    ROS Review of Systems  Constitutional: Negative.  Negative for chills, fatigue and fever.  HENT: Negative.    Eyes: Negative.   Respiratory: Negative.  Negative for chest tightness, shortness of breath and wheezing.   Cardiovascular:  Negative for chest pain, palpitations and leg swelling.  Gastrointestinal: Negative.  Negative for abdominal pain, blood in stool, constipation, diarrhea, nausea and vomiting.  Genitourinary: Negative.  Negative for difficulty urinating and dysuria.  Musculoskeletal:  Positive for gait problem. Negative for myalgias.  Skin:  Positive for pallor.  Hematological:  Negative for adenopathy. Does not bruise/bleed easily.  Psychiatric/Behavioral:  Positive for confusion and decreased  concentration.     Objective:  BP 130/60 (BP Location: Left Arm, Patient Position: Sitting, Cuff Size: Normal)   Pulse 88   Temp 97.9 F (36.6 C) (Oral)   Ht 5\' 6"  (1.676 m)   Wt 138 lb 3.2 oz (62.7 kg)   SpO2 96%   BMI 22.31 kg/m   BP Readings from Last 3 Encounters:  11/21/23 130/60  11/02/23 117/61  10/24/23 (!) 151/84    Wt Readings from Last 3 Encounters:  11/21/23 138 lb 3.2 oz (62.7 kg)  11/01/23 135 lb 12.8 oz (61.6  kg)  10/24/23 135 lb 12.8 oz (61.6 kg)    Physical Exam Vitals reviewed.  Constitutional:      Appearance: Normal appearance.  HENT:     Mouth/Throat:     Mouth: Mucous membranes are moist.  Eyes:     General: No scleral icterus.    Conjunctiva/sclera: Conjunctivae normal.  Cardiovascular:     Rate and Rhythm: Normal rate and regular rhythm.     Heart sounds: No murmur heard.    No friction rub. No gallop.  Pulmonary:     Effort: Pulmonary effort is normal.     Breath sounds: No stridor. No wheezing, rhonchi or rales.  Chest:     Comments: EKG- NSR, 84 bpm +++artifact No LVH, Q waves, or ST/T wave changes  Abdominal:     General: Abdomen is flat.     Palpations: There is no mass.     Tenderness: There is no abdominal tenderness. There is no guarding.     Hernia: No hernia is present.  Musculoskeletal:        General: Normal range of motion.     Cervical back: Neck supple.     Right lower leg: No edema.     Left lower leg: No edema.  Lymphadenopathy:     Cervical: No cervical adenopathy.  Skin:    General: Skin is warm and dry.     Coloration: Skin is pale.     Findings: No erythema.  Neurological:     General: No focal deficit present.     Mental Status: He is alert. Mental status is at baseline.  Psychiatric:        Mood and Affect: Mood normal.        Behavior: Behavior normal.     Lab Results  Component Value Date   WBC 10.7 (H) 11/21/2023   HGB 11.4 (L) 11/21/2023   HCT 34.8 (L) 11/21/2023   PLT 420.0 (H) 11/21/2023   GLUCOSE 135 (H) 11/21/2023   CHOL 131 11/21/2023   TRIG 84.0 11/21/2023   HDL 50.10 11/21/2023   LDLDIRECT 149.1 06/30/2013   LDLCALC 65 11/21/2023   ALT 11 11/21/2023   AST 17 11/21/2023   NA 137 11/21/2023   K 4.0 11/21/2023   CL 99 11/21/2023   CREATININE 1.04 11/21/2023   BUN 23 11/21/2023   CO2 31 11/21/2023   TSH 3.43 11/21/2023   PSA 0.57 09/02/2019   HGBA1C 5.8 11/21/2023    DG Pelvis Portable Result Date:  11/01/2023 CLINICAL DATA:  [id number] S/P total hip arthroplasty [id number] EXAM: PORTABLE PELVIS 1-2 VIEWS COMPARISON:  November 01, 2023 FINDINGS: Status post LEFT hip arthroplasty. Orthopedic hardware is intact and without periprosthetic fracture or lucency. Soft tissue air. Penile prosthesis. Osteopenia. Degenerative changes of the lower lumbar spine. IMPRESSION: Expected postsurgical appearance status post hip arthroplasty. Electronically Signed   By: Meda Klinefelter M.D.   On: 11/01/2023 13:40  DG HIP UNILAT WITH PELVIS 1V LEFT Result Date: 11/01/2023 CLINICAL DATA:  Elective surgery. Anterior total left hip arthroplasty. Intraoperative fluoroscopy. EXAM: DG HIP (WITH OR WITHOUT PELVIS) 1V*L* COMPARISON:  Pelvis and right hip radiographs 09/30/2018 FINDINGS: Images were performed intraoperatively without the presence of a radiologist. Severe superior left femoroacetabular joint space narrowing. Penile prosthesis again noted. The patient is undergoing total left hip arthroplasty. No hardware complication is seen. Total fluoroscopy images: 10 Total fluoroscopy time: 15 seconds Total dose: Radiation Exposure Index (as provided by the fluoroscopic device): 1.39 mGy air Kerma Please see intraoperative findings for further detail. IMPRESSION: Intraoperative fluoroscopy for total left hip arthroplasty. Electronically Signed   By: Neita Garnet M.D.   On: 11/01/2023 11:24   DG C-Arm 1-60 Min-No Report Result Date: 11/01/2023 Fluoroscopy was utilized by the requesting physician.  No radiographic interpretation.   DG C-Arm 1-60 Min-No Report Result Date: 11/01/2023 Fluoroscopy was utilized by the requesting physician.  No radiographic interpretation.    Assessment & Plan:   Hyperlipidemia with target LDL less than 130- LDL goal achieved. Doing well on the statin  -     TSH; Future -     Hepatic function panel; Future -     Lipid panel; Future  Essential hypertension- BP is well controlled. EKG is negative  for LVH. -     TSH; Future -     EKG 12-Lead -     Indapamide; Take 1 tablet (1.25 mg total) by mouth daily.  Dispense: 90 tablet; Refill: 0  Prediabetes -     Basic metabolic panel; Future -     Hemoglobin A1c; Future  Other iron deficiency anemia- Will start oral iron. -     IBC + Ferritin; Future -     CBC with Differential/Platelet; Future -     ACCRUFeR; Take 1 capsule (30 mg total) by mouth in the morning and at bedtime.  Dispense: 180 capsule; Refill: 0     Follow-up: Return in about 3 months (around 02/21/2024).  Sanda Linger, MD

## 2023-11-21 NOTE — Patient Instructions (Signed)

## 2023-11-22 ENCOUNTER — Other Ambulatory Visit: Payer: Self-pay | Admitting: Internal Medicine

## 2023-11-22 ENCOUNTER — Encounter: Payer: Self-pay | Admitting: Internal Medicine

## 2023-11-22 DIAGNOSIS — E785 Hyperlipidemia, unspecified: Secondary | ICD-10-CM

## 2023-12-10 DIAGNOSIS — L814 Other melanin hyperpigmentation: Secondary | ICD-10-CM | POA: Diagnosis not present

## 2023-12-10 DIAGNOSIS — D492 Neoplasm of unspecified behavior of bone, soft tissue, and skin: Secondary | ICD-10-CM | POA: Diagnosis not present

## 2023-12-10 DIAGNOSIS — C4321 Malignant melanoma of right ear and external auricular canal: Secondary | ICD-10-CM | POA: Diagnosis not present

## 2023-12-27 DIAGNOSIS — Z5189 Encounter for other specified aftercare: Secondary | ICD-10-CM | POA: Diagnosis not present

## 2024-01-14 DIAGNOSIS — C4331 Malignant melanoma of nose: Secondary | ICD-10-CM | POA: Diagnosis not present

## 2024-01-14 DIAGNOSIS — M950 Acquired deformity of nose: Secondary | ICD-10-CM | POA: Diagnosis not present

## 2024-01-15 DIAGNOSIS — M950 Acquired deformity of nose: Secondary | ICD-10-CM | POA: Insufficient documentation

## 2024-01-18 ENCOUNTER — Other Ambulatory Visit (HOSPITAL_COMMUNITY): Payer: Self-pay | Admitting: Otolaryngology

## 2024-01-18 DIAGNOSIS — C4331 Malignant melanoma of nose: Secondary | ICD-10-CM

## 2024-01-22 ENCOUNTER — Other Ambulatory Visit (HOSPITAL_COMMUNITY): Payer: Self-pay | Admitting: Otolaryngology

## 2024-01-22 DIAGNOSIS — C4331 Malignant melanoma of nose: Secondary | ICD-10-CM

## 2024-01-24 ENCOUNTER — Other Ambulatory Visit: Payer: Self-pay | Admitting: Otolaryngology

## 2024-01-29 NOTE — Progress Notes (Signed)
 Surgical Instructions   Your procedure is scheduled on Feb 07, 2024. Report to [facility] Main Entrance "A" at 12:00 P.M., then check in with the Admitting office. Any questions or running late day of surgery: call [phone]  Questions prior to your surgery date: call [phone], Monday-Friday, 8am-4pm. If you experience any cold or flu symptoms such as cough, fever, chills, shortness of breath, etc. between now and your scheduled surgery, please notify us  at the above number.     Remember:  Do not eat after midnight the night before your surgery   You may drink clear liquids until 11:00 the morning of your surgery.   Clear liquids allowed are: Water , Non-Citrus Juices (without pulp), Carbonated Beverages, Clear Tea (no milk, honey, etc.), Black Coffee Only (NO MILK, CREAM OR POWDERED CREAMER of any kind), and Gatorade.    Take these medicines the morning of surgery with A SIP OF WATER   atorvastatin  (LIPITOR)  omeprazole  (PRILOSEC)   May take these medicines IF NEEDED:    One week prior to surgery, STOP taking any Aspirin  (unless otherwise instructed by your surgeon) Aleve, Naproxen, Ibuprofen, Motrin, Advil, Goody's, BC's, all herbal medications, fish oil, and non-prescription vitamins.                     Do NOT Smoke (Tobacco/Vaping) for 24 hours prior to your procedure.  If you use a CPAP at night, you may bring your mask/headgear for your overnight stay.   You will be asked to remove any contacts, glasses, piercing's, hearing aid's, dentures/partials prior to surgery. Please bring cases for these items if needed.    Patients discharged the day of surgery will not be allowed to drive home, and someone needs to stay with them for 24 hours.  SURGICAL WAITING ROOM VISITATION Patients may have no more than 2 support people in the waiting area - these visitors may rotate.   Pre-op nurse will coordinate an appropriate time for 1 ADULT support person, who may not rotate, to  accompany patient in pre-op.  Children under the age of 43 must have an adult with them who is not the patient and must remain in the main waiting area with an adult.  If the patient needs to stay at the hospital during part of their recovery, the visitor guidelines for inpatient rooms apply.  Please refer to the [facility] website for the visitor guidelines for any additional information.   If you received a COVID test during your pre-op visit  it is requested that you wear a mask when out in public, stay away from anyone that may not be feeling well and notify your surgeon if you develop symptoms. If you have been in contact with anyone that has tested positive in the last 10 days please notify you surgeon.      Pre-operative CHG Bathing Instructions   You can play a key role in reducing the risk of infection after surgery. Your skin needs to be as free of germs as possible. You can reduce the number of germs on your skin by washing with CHG (chlorhexidine  gluconate) soap before surgery. CHG is an antiseptic soap that kills germs and continues to kill germs even after washing.   DO NOT use if you have an allergy to chlorhexidine /CHG or antibacterial soaps. If your skin becomes reddened or irritated, stop using the CHG and notify one of our RNs at [phone].  TAKE A SHOWER THE NIGHT BEFORE SURGERY AND THE DAY OF SURGERY    Please keep in mind the following:  DO NOT shave, including legs and underarms, 48 hours prior to surgery.   You may shave your face before/day of surgery.  Place clean sheets on your bed the night before surgery Use a clean washcloth (not used since being washed) for each shower. DO NOT sleep with pet's night before surgery.  CHG Shower Instructions:  Wash your face and private area with normal soap. If you choose to wash your hair, wash first with your normal shampoo.  After you use shampoo/soap, rinse your hair and body thoroughly to remove  shampoo/soap residue.  Turn the water  OFF and apply half the bottle of CHG soap to a CLEAN washcloth.  Apply CHG soap ONLY FROM YOUR NECK DOWN TO YOUR TOES (washing for 3-5 minutes)  DO NOT use CHG soap on face, private areas, open wounds, or sores.  Pay special attention to the area where your surgery is being performed.  If you are having back surgery, having someone wash your back for you may be helpful. Wait 2 minutes after CHG soap is applied, then you may rinse off the CHG soap.  Pat dry with a clean towel  Put on clean pajamas    Additional instructions for the day of surgery: DO NOT APPLY any lotions, deodorants, cologne, or perfumes.   Do not wear jewelry or makeup Do not wear nail polish, gel polish, artificial nails, or any other type of covering on natural nails (fingers and toes) Do not bring valuables to the hospital. [facility] is not responsible for valuables/personal belongings. Put on clean/comfortable clothes.  Please brush your teeth.  Ask your nurse before applying any prescription medications to the skin.

## 2024-01-30 ENCOUNTER — Encounter (HOSPITAL_COMMUNITY)
Admission: RE | Admit: 2024-01-30 | Discharge: 2024-01-30 | Disposition: A | Discharge status: Home / Self Care | Source: Ambulatory Visit | Attending: Otolaryngology

## 2024-01-30 ENCOUNTER — Other Ambulatory Visit (HOSPITAL_COMMUNITY): Payer: Self-pay | Admitting: Otolaryngology

## 2024-01-30 ENCOUNTER — Encounter (HOSPITAL_COMMUNITY)
Admission: RE | Admit: 2024-01-30 | Discharge: 2024-01-30 | Disposition: A | Discharge status: Home / Self Care | Source: Ambulatory Visit | Attending: Otolaryngology | Admitting: Otolaryngology

## 2024-01-30 ENCOUNTER — Other Ambulatory Visit: Payer: Self-pay

## 2024-01-30 ENCOUNTER — Encounter (HOSPITAL_COMMUNITY): Payer: Self-pay

## 2024-01-30 VITALS — BP 168/82 | HR 53 | Temp 97.9°F | Resp 18 | Ht 66.0 in | Wt 141.0 lb

## 2024-01-30 DIAGNOSIS — I10 Essential (primary) hypertension: Secondary | ICD-10-CM

## 2024-01-30 DIAGNOSIS — C4331 Malignant melanoma of nose: Secondary | ICD-10-CM | POA: Insufficient documentation

## 2024-01-30 DIAGNOSIS — Z01818 Encounter for other preprocedural examination: Secondary | ICD-10-CM

## 2024-01-30 HISTORY — DX: Acquired deformity of nose: M95.0

## 2024-01-30 HISTORY — DX: Malignant melanoma of nose: C43.31

## 2024-01-30 HISTORY — DX: Umbilical hernia without obstruction or gangrene: K42.9

## 2024-01-30 LAB — CBC
HCT: 42.4 % (ref 39.0–52.0)
Hemoglobin: 13.6 g/dL (ref 13.0–17.0)
MCH: 29.8 pg (ref 26.0–34.0)
MCHC: 32.1 g/dL (ref 30.0–36.0)
MCV: 93 fL (ref 80.0–100.0)
Platelets: 244 10*3/uL (ref 150–400)
RBC: 4.56 MIL/uL (ref 4.22–5.81)
RDW: 13.2 % (ref 11.5–15.5)
WBC: 6.4 10*3/uL (ref 4.0–10.5)
nRBC: 0 % (ref 0.0–0.2)

## 2024-01-30 LAB — BASIC METABOLIC PANEL WITH GFR
Anion gap: 4 — ABNORMAL LOW (ref 5–15)
BUN: 17 mg/dL (ref 8–23)
CO2: 29 mmol/L (ref 22–32)
Calcium: 8.9 mg/dL (ref 8.9–10.3)
Chloride: 107 mmol/L (ref 98–111)
Creatinine, Ser: 0.91 mg/dL (ref 0.61–1.24)
GFR, Estimated: >60 mL/min (ref >60)
Glucose, Bld: 106 mg/dL — ABNORMAL HIGH (ref 70–99)
Potassium: 4.3 mmol/L (ref 3.5–5.1)
Sodium: 140 mmol/L (ref 135–145)

## 2024-01-30 MED ORDER — TECHNETIUM TC 99M TILMANOCEPT KIT
0.5000 | PACK | Freq: Once | INTRAVENOUS | Status: AC | PRN
  Administered 2024-01-30: 0.5 via INTRADERMAL

## 2024-01-30 NOTE — Progress Notes (Signed)
 Surgical Instructions   Your procedure is scheduled on Feb 07, 2024. Report to [facility] Main Entrance "A" at 11:15 A.M., then check in with the Admitting office. Any questions or running late day of surgery: call [phone]  Questions prior to your surgery date: call [phone], Monday-Friday, 8am-4pm. If you experience any cold or flu symptoms such as cough, fever, chills, shortness of breath, etc. between now and your scheduled surgery, please notify us  at the above number.     Remember:  Do not eat after midnight the night before your surgery   You may drink clear liquids until 10:15 AM the morning of your surgery.   Clear liquids allowed are: Water , Non-Citrus Juices (without pulp), Carbonated Beverages, Clear Tea (no milk, honey, etc.), Black Coffee Only (NO MILK, CREAM OR POWDERED CREAMER of any kind), and Gatorade.    Take these medicines the morning of surgery with A SIP OF WATER   atorvastatin  (LIPITOR)  omeprazole  (PRILOSEC)    One week prior to surgery, STOP taking any Aspirin  (unless otherwise instructed by your surgeon) Aleve, Naproxen, Ibuprofen, Motrin, Advil, Goody's, BC's, all herbal medications, fish oil, and non-prescription vitamins.                     Do NOT Smoke (Tobacco/Vaping) for 24 hours prior to your procedure.  If you use a CPAP at night, you may bring your mask/headgear for your overnight stay.   You will be asked to remove any contacts, glasses, piercing's, hearing aid's, dentures/partials prior to surgery. Please bring cases for these items if needed.    Patients discharged the day of surgery will not be allowed to drive home, and someone needs to stay with them for 24 hours.  SURGICAL WAITING ROOM VISITATION Patients may have no more than 2 support people in the waiting area - these visitors may rotate.   Pre-op nurse will coordinate an appropriate time for 1 ADULT support person, who may not rotate, to accompany patient in pre-op.   Children under the age of 33 must have an adult with them who is not the patient and must remain in the main waiting area with an adult.  If the patient needs to stay at the hospital during part of their recovery, the visitor guidelines for inpatient rooms apply.  Please refer to the [facility] website for the visitor guidelines for any additional information.   If you received a COVID test during your pre-op visit  it is requested that you wear a mask when out in public, stay away from anyone that may not be feeling well and notify your surgeon if you develop symptoms. If you have been in contact with anyone that has tested positive in the last 10 days please notify you surgeon.      Pre-operative CHG Bathing Instructions   You can play a key role in reducing the risk of infection after surgery. Your skin needs to be as free of germs as possible. You can reduce the number of germs on your skin by washing with CHG (chlorhexidine  gluconate) soap before surgery. CHG is an antiseptic soap that kills germs and continues to kill germs even after washing.   DO NOT use if you have an allergy to chlorhexidine /CHG or antibacterial soaps. If your skin becomes reddened or irritated, stop using the CHG and notify one of our RNs at [phone].              TAKE A SHOWER THE NIGHT BEFORE  SURGERY AND THE DAY OF SURGERY    Please keep in mind the following:  DO NOT shave, including legs and underarms, 48 hours prior to surgery.   You may shave your face before/day of surgery.  Place clean sheets on your bed the night before surgery Use a clean washcloth (not used since being washed) for each shower. DO NOT sleep with pet's night before surgery.  CHG Shower Instructions:  Wash your face and private area with normal soap. If you choose to wash your hair, wash first with your normal shampoo.  After you use shampoo/soap, rinse your hair and body thoroughly to remove shampoo/soap residue.  Turn the  water  OFF and apply half the bottle of CHG soap to a CLEAN washcloth.  Apply CHG soap ONLY FROM YOUR NECK DOWN TO YOUR TOES (washing for 3-5 minutes)  DO NOT use CHG soap on face, private areas, open wounds, or sores.  Pay special attention to the area where your surgery is being performed.  If you are having back surgery, having someone wash your back for you may be helpful. Wait 2 minutes after CHG soap is applied, then you may rinse off the CHG soap.  Pat dry with a clean towel  Put on clean pajamas    Additional instructions for the day of surgery: DO NOT APPLY any lotions, deodorants, cologne, or perfumes.   Do not wear jewelry or makeup Do not wear nail polish, gel polish, artificial nails, or any other type of covering on natural nails (fingers and toes) Do not bring valuables to the hospital. [facility] is not responsible for valuables/personal belongings. Put on clean/comfortable clothes.  Please brush your teeth.  Ask your nurse before applying any prescription medications to the skin.

## 2024-01-30 NOTE — Progress Notes (Signed)
 PCP - Dr. Sandra Crouch Cardiologist - denies  PPM/ICD - denies   Chest x-ray - 02/14/23 EKG - 11/21/23 Stress Test - denies ECHO - denies Cardiac Cath - denies  Sleep Study - denies   DM- denies  Last dose of GLP1 agonist-  n/a   ASA/Blood Thinner Instructions: n/a   ERAS Protcol - clears until 1015   COVID TEST- n/a   Anesthesia review: no  Patient denies shortness of breath, fever, cough and chest pain at PAT appointment   All instructions explained to the patient, with a verbal understanding of the material. Patient agrees to go over the instructions while at home for a better understanding.  The opportunity to ask questions was provided.

## 2024-02-05 ENCOUNTER — Other Ambulatory Visit: Payer: Self-pay | Admitting: Internal Medicine

## 2024-02-06 DIAGNOSIS — C4331 Malignant melanoma of nose: Secondary | ICD-10-CM | POA: Diagnosis not present

## 2024-02-07 ENCOUNTER — Other Ambulatory Visit: Payer: Self-pay

## 2024-02-07 ENCOUNTER — Ambulatory Visit (HOSPITAL_COMMUNITY)
Admission: RE | Admit: 2024-02-07 | Discharge: 2024-02-07 | Disposition: A | Discharge status: Home / Self Care | Attending: Otolaryngology | Admitting: Otolaryngology

## 2024-02-07 ENCOUNTER — Encounter (HOSPITAL_COMMUNITY): Payer: Self-pay | Admitting: Otolaryngology

## 2024-02-07 ENCOUNTER — Encounter (HOSPITAL_COMMUNITY)
Admission: RE | Disposition: A | Discharge status: Home / Self Care | Payer: Self-pay | Source: Home / Self Care | Attending: Otolaryngology

## 2024-02-07 ENCOUNTER — Encounter (HOSPITAL_COMMUNITY)
Admission: RE | Admit: 2024-02-07 | Discharge: 2024-02-07 | Disposition: A | Discharge status: Home / Self Care | Source: Ambulatory Visit | Attending: Otolaryngology | Admitting: Otolaryngology

## 2024-02-07 ENCOUNTER — Ambulatory Visit (HOSPITAL_COMMUNITY): Payer: Self-pay | Admitting: Anesthesiology

## 2024-02-07 DIAGNOSIS — Z85820 Personal history of malignant melanoma of skin: Secondary | ICD-10-CM | POA: Diagnosis not present

## 2024-02-07 DIAGNOSIS — C760 Malignant neoplasm of head, face and neck: Secondary | ICD-10-CM | POA: Diagnosis not present

## 2024-02-07 DIAGNOSIS — E785 Hyperlipidemia, unspecified: Secondary | ICD-10-CM

## 2024-02-07 DIAGNOSIS — I10 Essential (primary) hypertension: Secondary | ICD-10-CM

## 2024-02-07 DIAGNOSIS — M950 Acquired deformity of nose: Secondary | ICD-10-CM | POA: Diagnosis not present

## 2024-02-07 DIAGNOSIS — Z428 Encounter for other plastic and reconstructive surgery following medical procedure or healed injury: Secondary | ICD-10-CM | POA: Diagnosis not present

## 2024-02-07 DIAGNOSIS — K219 Gastro-esophageal reflux disease without esophagitis: Secondary | ICD-10-CM | POA: Diagnosis not present

## 2024-02-07 DIAGNOSIS — C4331 Malignant melanoma of nose: Secondary | ICD-10-CM

## 2024-02-07 DIAGNOSIS — K449 Diaphragmatic hernia without obstruction or gangrene: Secondary | ICD-10-CM | POA: Insufficient documentation

## 2024-02-07 HISTORY — PX: LYMPH NODE BIOPSY: SHX201

## 2024-02-07 HISTORY — PX: EXCISION NASAL MASS: SHX6271

## 2024-02-07 SURGERY — EXCISION, MASS, NOSE
Anesthesia: General | Site: Neck | Laterality: Left

## 2024-02-07 MED ORDER — DOUBLE ANTIBIOTIC 500-10000 UNIT/GM EX OINT
TOPICAL_OINTMENT | CUTANEOUS | Status: AC
Start: 2024-02-07 — End: ?
  Filled 2024-02-07: qty 28.4

## 2024-02-07 MED ORDER — MIDAZOLAM HCL 2 MG/2ML IJ SOLN
INTRAMUSCULAR | Status: DC | PRN
  Administered 2024-02-07 (×2): 1 mg via INTRAVENOUS

## 2024-02-07 MED ORDER — MIDAZOLAM HCL 2 MG/2ML IJ SOLN
INTRAMUSCULAR | Status: AC
Start: 2024-02-07 — End: ?
  Filled 2024-02-07: qty 2

## 2024-02-07 MED ORDER — ROCURONIUM BROMIDE 10 MG/ML (PF) SYRINGE
PREFILLED_SYRINGE | INTRAVENOUS | Status: DC | PRN
  Administered 2024-02-07: 50 mg via INTRAVENOUS

## 2024-02-07 MED ORDER — BACITRACIN ZINC 500 UNIT/GM EX OINT
TOPICAL_OINTMENT | CUTANEOUS | Status: DC | PRN
Start: 2024-02-07 — End: 2024-02-07
  Administered 2024-02-07: 1 via TOPICAL

## 2024-02-07 MED ORDER — HYDROCODONE-ACETAMINOPHEN 5-325 MG PO TABS: 1.0000 | ORAL_TABLET | Freq: Four times a day (QID) | ORAL | 0 refills | Status: AC | PRN

## 2024-02-07 MED ORDER — TECHNETIUM TC 99M TILMANOCEPT KIT
0.5000 | PACK | Freq: Once | INTRAVENOUS | Status: AC | PRN
  Administered 2024-02-07: 0.5 via INTRADERMAL

## 2024-02-07 MED ORDER — DEXAMETHASONE SODIUM PHOSPHATE 10 MG/ML IJ SOLN
INTRAMUSCULAR | Status: DC | PRN
  Administered 2024-02-07: 5 mg via INTRAVENOUS

## 2024-02-07 MED ORDER — PROPOFOL 10 MG/ML IV BOLUS
INTRAVENOUS | Status: DC | PRN
  Administered 2024-02-07: 150 mg via INTRAVENOUS

## 2024-02-07 MED ORDER — PHENYLEPHRINE HCL-NACL 20-0.9 MG/250ML-% IV SOLN
INTRAVENOUS | Status: DC | PRN
  Administered 2024-02-07: 80 ug/min via INTRAVENOUS

## 2024-02-07 MED ORDER — PHENYLEPHRINE 80 MCG/ML (10ML) SYRINGE FOR IV PUSH (FOR BLOOD PRESSURE SUPPORT)
PREFILLED_SYRINGE | INTRAVENOUS | Status: DC | PRN
  Administered 2024-02-07 (×4): 80 ug via INTRAVENOUS
  Administered 2024-02-07 (×2): 160 ug via INTRAVENOUS
  Administered 2024-02-07: 200 ug via INTRAVENOUS

## 2024-02-07 MED ORDER — LACTATED RINGERS IV SOLN: INTRAVENOUS | Status: DC

## 2024-02-07 MED ORDER — LIDOCAINE 2% (20 MG/ML) 5 ML SYRINGE
INTRAMUSCULAR | Status: DC | PRN
  Administered 2024-02-07: 80 mg via INTRAVENOUS

## 2024-02-07 MED ORDER — BSS IO SOLN
INTRAOCULAR | Status: DC | PRN
  Administered 2024-02-07: 15 mL

## 2024-02-07 MED ORDER — LIDOCAINE-EPINEPHRINE 1 %-1:100000 IJ SOLN
INTRAMUSCULAR | Status: AC
Start: 2024-02-07 — End: ?
  Filled 2024-02-07: qty 1

## 2024-02-07 MED ORDER — SURGIFLO WITH THROMBIN (HEMOSTATIC MATRIX KIT) OPTIME
TOPICAL | Status: DC | PRN
  Administered 2024-02-07: 1 via TOPICAL

## 2024-02-07 MED ORDER — CEFAZOLIN SODIUM-DEXTROSE 2-4 GM/100ML-% IV SOLN
2.0000 g | INTRAVENOUS | Status: AC
  Administered 2024-02-07: 2 g via INTRAVENOUS
  Filled 2024-02-07: qty 100

## 2024-02-07 MED ORDER — FENTANYL CITRATE (PF) 250 MCG/5ML IJ SOLN
INTRAMUSCULAR | Status: DC | PRN
  Administered 2024-02-07 (×2): 100 ug via INTRAVENOUS

## 2024-02-07 MED ORDER — BUPIVACAINE HCL (PF) 0.25 % IJ SOLN
INTRAMUSCULAR | Status: AC
Start: 2024-02-07 — End: ?
  Filled 2024-02-07: qty 10

## 2024-02-07 MED ORDER — ORAL CARE MOUTH RINSE: 15.0000 mL | Freq: Once | OROMUCOSAL | Status: AC

## 2024-02-07 MED ORDER — LIDOCAINE-EPINEPHRINE 1 %-1:100000 IJ SOLN
INTRAMUSCULAR | Status: DC | PRN
  Administered 2024-02-07: 15 mL

## 2024-02-07 MED ORDER — 0.9 % SODIUM CHLORIDE (POUR BTL) OPTIME
TOPICAL | Status: DC | PRN
  Administered 2024-02-07: 1000 mL

## 2024-02-07 MED ORDER — ROCURONIUM BROMIDE 10 MG/ML (PF) SYRINGE
PREFILLED_SYRINGE | INTRAVENOUS | Status: AC
Start: 2024-02-07 — End: ?
  Filled 2024-02-07: qty 10

## 2024-02-07 MED ORDER — FENTANYL CITRATE (PF) 100 MCG/2ML IJ SOLN: 25.0000 ug | INTRAMUSCULAR | Status: DC | PRN

## 2024-02-07 MED ORDER — BSS IO SOLN
INTRAOCULAR | Status: AC
  Filled 2024-02-07: qty 30

## 2024-02-07 MED ORDER — FENTANYL CITRATE (PF) 250 MCG/5ML IJ SOLN
INTRAMUSCULAR | Status: AC
  Filled 2024-02-07: qty 5

## 2024-02-07 MED ORDER — SUGAMMADEX SODIUM 200 MG/2ML IV SOLN
INTRAVENOUS | Status: DC | PRN
  Administered 2024-02-07: 200 mg via INTRAVENOUS

## 2024-02-07 MED ORDER — ONDANSETRON HCL 4 MG/2ML IJ SOLN
INTRAMUSCULAR | Status: DC | PRN
  Administered 2024-02-07: 4 mg via INTRAVENOUS

## 2024-02-07 MED ORDER — PROPOFOL 10 MG/ML IV BOLUS
INTRAVENOUS | Status: AC
  Filled 2024-02-07: qty 20

## 2024-02-07 MED ORDER — ONDANSETRON HCL 4 MG/2ML IJ SOLN: 4.0000 mg | Freq: Once | INTRAMUSCULAR | Status: DC | PRN

## 2024-02-07 MED ORDER — CHLORHEXIDINE GLUCONATE 0.12 % MT SOLN
15.0000 mL | Freq: Once | OROMUCOSAL | Status: AC
  Administered 2024-02-07: 15 mL via OROMUCOSAL
  Filled 2024-02-07: qty 15

## 2024-02-07 MED ORDER — LIDOCAINE 2% (20 MG/ML) 5 ML SYRINGE
INTRAMUSCULAR | Status: AC
  Filled 2024-02-07: qty 5

## 2024-02-07 MED ORDER — ACETAMINOPHEN 500 MG PO TABS
1000.0000 mg | ORAL_TABLET | Freq: Once | ORAL | Status: AC
  Administered 2024-02-07: 1000 mg via ORAL
  Filled 2024-02-07: qty 2

## 2024-02-07 MED ORDER — ONDANSETRON HCL 4 MG/2ML IJ SOLN
INTRAMUSCULAR | Status: AC
  Filled 2024-02-07: qty 2

## 2024-02-07 MED ORDER — PHENYLEPHRINE 80 MCG/ML (10ML) SYRINGE FOR IV PUSH (FOR BLOOD PRESSURE SUPPORT)
PREFILLED_SYRINGE | INTRAVENOUS | Status: AC
  Filled 2024-02-07: qty 10

## 2024-02-07 MED ORDER — DEXAMETHASONE SODIUM PHOSPHATE 10 MG/ML IJ SOLN
INTRAMUSCULAR | Status: AC
  Filled 2024-02-07: qty 1

## 2024-02-07 SURGICAL SUPPLY — 57 items
APPLICATOR DR MATTHEWS STRL (MISCELLANEOUS) IMPLANT
BAG COUNTER SPONGE SURGICOUNT (BAG) ×3 IMPLANT
BLADE SURG 15 STRL LF DISP TIS (BLADE) ×3 IMPLANT
CANISTER SUCTION 3000ML PPV (SUCTIONS) ×3 IMPLANT
CLEANER TIP ELECTROSURG 2X2 (MISCELLANEOUS) ×3 IMPLANT
CLIP TI MEDIUM 24 (CLIP) ×3 IMPLANT
CLIP TI WIDE RED SMALL 24 (CLIP) ×3 IMPLANT
CNTNR URN SCR LID CUP LEK RST (MISCELLANEOUS) ×12 IMPLANT
CORD BIPOLAR FORCEPS 12FT (ELECTRODE) ×3 IMPLANT
COVER PROBE CYLINDRICAL 5X96 (MISCELLANEOUS) IMPLANT
COVER SURGICAL LIGHT HANDLE (MISCELLANEOUS) ×3 IMPLANT
DERMABOND ADVANCED .7 DNX12 (GAUZE/BANDAGES/DRESSINGS) ×3 IMPLANT
DRAPE HALF SHEET 40X57 (DRAPES) ×3 IMPLANT
DRESSING NASAL POPE 10X1.5X2.5 (GAUZE/BANDAGES/DRESSINGS) ×3 IMPLANT
DRSG TEGADERM 2-3/8X2-3/4 SM (GAUZE/BANDAGES/DRESSINGS) ×6 IMPLANT
DRSG TEGADERM 4X4.75 (GAUZE/BANDAGES/DRESSINGS) ×3 IMPLANT
DRSG TELFA 3X8 NADH STRL (GAUZE/BANDAGES/DRESSINGS) ×3 IMPLANT
ELECT COATED BLADE 2.86 ST (ELECTRODE) ×3 IMPLANT
ELECTRODE REM PT RTRN 9FT ADLT (ELECTROSURGICAL) ×3 IMPLANT
GAUZE 4X4 16PLY ~~LOC~~+RFID DBL (SPONGE) IMPLANT
GAUZE KITTNER 4X5 RF (MISCELLANEOUS) IMPLANT
GAUZE XEROFORM 1X8 LF (GAUZE/BANDAGES/DRESSINGS) ×3 IMPLANT
GLOVE BIO SURGEON STRL SZ7.5 (GLOVE) ×3 IMPLANT
GLOVE BIOGEL PI IND STRL 8 (GLOVE) ×3 IMPLANT
GOWN STRL REUS W/ TWL LRG LVL3 (GOWN DISPOSABLE) ×6 IMPLANT
GOWN STRL REUS W/ TWL XL LVL3 (GOWN DISPOSABLE) ×3 IMPLANT
KIT BASIN OR (CUSTOM PROCEDURE TRAY) ×3 IMPLANT
KIT TURNOVER KIT B (KITS) ×3 IMPLANT
MARKER SKIN DUAL TIP RULER LAB (MISCELLANEOUS) ×6 IMPLANT
NDL PRECISIONGLIDE 27X1.5 (NEEDLE) ×3 IMPLANT
NEEDLE PRECISIONGLIDE 27X1.5 (NEEDLE) ×4 IMPLANT
NS IRRIG 1000ML POUR BTL (IV SOLUTION) ×3 IMPLANT
PAD ARMBOARD POSITIONER FOAM (MISCELLANEOUS) ×6 IMPLANT
PENCIL SMOKE EVACUATOR (MISCELLANEOUS) ×3 IMPLANT
SPECIMEN JAR MEDIUM (MISCELLANEOUS) IMPLANT
SPONGE INTESTINAL PEANUT (DISPOSABLE) ×3 IMPLANT
SPONGE T-LAP 18X18 ~~LOC~~+RFID (SPONGE) IMPLANT
STAPLER SKIN PROX 35W (STAPLE) ×3 IMPLANT
SURGIFLO W/THROMBIN 8M KIT (HEMOSTASIS) IMPLANT
SUT CHROMIC 5 0 P 3 (SUTURE) IMPLANT
SUT ETHILON 5 0 PS 2 18 (SUTURE) IMPLANT
SUT ETHILON 6 0 9-3 1X18 BLK (SUTURE) IMPLANT
SUT MON AB 4-0 PC3 18 (SUTURE) ×3 IMPLANT
SUT MON AB 5-0 P3 18 (SUTURE) IMPLANT
SUT MON AB 5-0 PS2 18 (SUTURE) IMPLANT
SUT PDS AB 5-0 P3 18 (SUTURE) IMPLANT
SUT SILK 2 0 SH (SUTURE) ×3 IMPLANT
SUT SILK 3 0 REEL (SUTURE) ×3 IMPLANT
SUT SILK 3 0 SH CR/8 (SUTURE) IMPLANT
SUT SILK 4 0 REEL (SUTURE) IMPLANT
SUT VIC AB 3-0 SH 8-18 (SUTURE) ×3 IMPLANT
SUT VIC AB 4-0 PS2 18 (SUTURE) IMPLANT
SYR CONTROL 10ML LL (SYRINGE) ×3 IMPLANT
TOWEL GREEN STERILE (TOWEL DISPOSABLE) ×3 IMPLANT
TOWEL GREEN STERILE FF (TOWEL DISPOSABLE) ×3 IMPLANT
TRAY ENT MC OR (CUSTOM PROCEDURE TRAY) ×3 IMPLANT
WATER STERILE IRR 1000ML POUR (IV SOLUTION) ×3 IMPLANT

## 2024-02-07 NOTE — Anesthesia Preprocedure Evaluation (Addendum)
 Anesthesia Evaluation  Patient identified by MRN, date of birth, ID band Patient awake  General Assessment Comment:Malignant melanoma of nose; Acquired deformity of nose  Reviewed: Allergy & Precautions, NPO status , Patient's Chart, lab work & pertinent test results  Airway Mallampati: III  TM Distance: >3 FB Neck ROM: Full    Dental  (+) Dental Advisory Given, Upper Dentures, Partial Lower   Pulmonary neg pulmonary ROS   Pulmonary exam normal breath sounds clear to auscultation       Cardiovascular hypertension, (-) angina (-) Past MI Normal cardiovascular exam Rhythm:Regular Rate:Normal     Neuro/Psych negative neurological ROS  negative psych ROS   GI/Hepatic Neg liver ROS, hiatal hernia,GERD  Medicated,,  Endo/Other  negative endocrine ROS    Renal/GU negative Renal ROS     Musculoskeletal  (+) Arthritis ,    Abdominal   Peds  Hematology negative hematology ROS (+)   Anesthesia Other Findings Day of surgery medications reviewed with the patient.  Reproductive/Obstetrics                             Anesthesia Physical Anesthesia Plan  ASA: 3  Anesthesia Plan: General   Post-op Pain Management: Tylenol  PO (pre-op)*   Induction: Intravenous  PONV Risk Score and Plan: 3 and Dexamethasone , Ondansetron  and Treatment may vary due to age or medical condition  Airway Management Planned: Oral ETT  Additional Equipment:   Intra-op Plan:   Post-operative Plan: Extubation in OR  Informed Consent: I have reviewed the patients History and Physical, chart, labs and discussed the procedure including the risks, benefits and alternatives for the proposed anesthesia with the patient or authorized representative who has indicated his/her understanding and acceptance.     Dental advisory given  Plan Discussed with: CRNA  Anesthesia Plan Comments:         Anesthesia Quick  Evaluation

## 2024-02-07 NOTE — Progress Notes (Signed)
 Dr. Ralston Burkes called to clarify location of nuc med injection site. Instructed to remove dressing, inject adjacent to nasal wound.

## 2024-02-07 NOTE — Anesthesia Postprocedure Evaluation (Signed)
 Anesthesia Post Note  Patient: Don Johnson  Procedure(s) Performed: EXCISION, MASS, NOSE (Left: Face) LYMPH NODE BIOPSY (Bilateral: Neck)     Patient location during evaluation: PACU Anesthesia Type: General Level of consciousness: awake and alert Pain management: pain level controlled Vital Signs Assessment: post-procedure vital signs reviewed and stable Respiratory status: spontaneous breathing, nonlabored ventilation, respiratory function stable and patient connected to nasal cannula oxygen Cardiovascular status: blood pressure returned to baseline and stable Postop Assessment: no apparent nausea or vomiting Anesthetic complications: no   No notable events documented.  Last Vitals:  Vitals:   02/07/24 1058 02/07/24 1600  BP: (!) 198/91 130/60  Pulse: (!) 57 (!) 57  Resp: 20 10  Temp: 36.5 C (!) 36.1 C  SpO2: 96% 97%    Last Pain:  Vitals:   02/07/24 1600  TempSrc:   PainSc: Asleep                 Erin Havers

## 2024-02-07 NOTE — Op Note (Addendum)
 OPERATIVE NOTE  Don Johnson Date/Time of Admission: 02/07/2024 10:42 AM  CSN: [id number];WUJ:[id number] Attending Provider: Rush Coupe, MD Room/Bed: MCPO/NONE DOB: [birth year] Age: 83 y.o.   Pre-Op Diagnosis: Malignant melanoma of nose; Acquired deformity of nose  Post-Op Diagnosis: Malignant melanoma of nose; Acquired deformity of nose  Procedure:  Left neck sentinel lymph node biopsy with intra-operative localization for melanoma (CPT [phone], [phone]) Repair mohs defect left nose 1.5x2.0cm with interpolated pedicled left nasolabial flap 1.5x7cm (CPT 15576) Left auricular cartilage graft applied to left nasal defect (22x38mm) (CPT 21235) Excision wound head <100cm2 in preparation for flap (1.5x2.0 cm nasal mohs defect) (CPT 15725)  Anesthesia: General  Surgeon(s): Marijane Shoulders, MD  Staff: Circulator: Flavio Huguenin, RN Scrub Person: Jacqualin Mate RN First Assistant: Hortense Lyons, RN  Implants: * No implants in log *  Specimens: ID Type Source Tests Collected by Time Destination  1 : Left neck, level 1B sentinel lymph node Tissue PATH Lymph node excision SURGICAL PATHOLOGY Rush Coupe, MD 02/07/2024 1418     Complications: none  EBL: 30 ML  IVF: Per anesthesia ML  Condition: stable  Operative Findings:  2.0x1.5cm left nasal alar mohs defect with preservation of internal lining  Haiku Photo:    Left Level IB Perifacial Sentinel lymph node ~1.5cm Percutaneous: 337 In vivo lymph node: 401 Ex-Vivo lymph node: 851 Wound bed ex vivo: 29  Indications for Procedure: 83 year old male with a hx of at least pT2a (DOI at least 1.75mm) s/p mohs excision yesterday who presents today for (1) Repair mohs defect of the nose and (2) left neck sentinel lymph node biopsy. Informed consent obtained. RBA discussed.   Description of Operation: The patient was identified in the preoperative area and consent confirmed in the chart.  He was brought to the operating  room by the anesthetist and a preoperative timeout was performed confirming the patient identity and procedure to be performed.  Once all were in agreement we proceeded with surgery.  General anesthesia was induced and the patient was intubated with an oral tube.  The patient was turned 180 degrees from anesthesia.  The patient's nose was examined with the findings noted above.  The patient's left neck was marked out 2 fingerbreadths below the level of the mandible in a pre-existing neck crease for a sentinel lymph node biopsy.  An approximately 5 cm incision was marked out.  The gamma probe was used to localize the sentinel lymph node which was in the left level 1B perifacial region at the mandibular notch with a gamma probe reading of 337.  The patient was then prepped and draped in standard fashion for a procedure of this kind. A Final preoperative pause was performed and we proceed with surgery.  I began with left neck sentinel lymph node biopsy for melanoma.  A 15 blade was used to make the transverse neck crease incision 2 fingerbreadths below the mandible, 5cm in length.  Platysma muscle was divided.  Double-pronged skin hooks were applied and subplatysmal flaps were raised to the level of the mandible.  2-0 silk retaining sutures were placed in the positional flaps to retract the skin flaps.  The inferior aspect of submandibular gland fascia was divided bluntly and dissected superiorly over the gland.  The facial vein was identified posteriorly and dissected superiorly to the level of the mandible.  Here the gamma probe localized a left level 1B perifacial lymph node approximately 1-1/2 cm in diameter.  The in vivo lymph node  gamma reading was 401.  The lymph node was carefully dissected out using blunt dissection and bipolar cautery and surgical clips.  I stayed below the level of the marginal mandibular nerve which was reflected up in the submandibular gland fascia.  The lymph node was removed.  The ex  vivo gamma probe lymph node reading was 851.  The wound bed now read 29.  This was a greater than 90% reduction in reading of the wound bed after excision of the sentinel lymph node, confirming appropriate sentinel node.  The wound bed was then copiously irrigated with saline and Valsalva performed.  Hemostasis was achieved.  The neck approach was closed in layered fashion using buried interrupted 3-0 Vicryl sutures for the dermal platysmal layer followed by running 5-0 nylon for skin.  The wound was dressed with bacitracin , Telfa and brown tape.  I then turned to address the left nasal Mohs defect.  The nasal wound measured 2 by 1.5 cm with preservation of the internal lining.  I began with excision of the wound 2 x 1.5 cm in preparation for flap.  A sub-SMAS plane was sharply dissected circumferentially and a 15 blade to prepare for flap inset and cartilage grafting.  Hemostasis was achieved with bipolar cautery.  Next I proceeded with left auricular cartilage graft harvest applied to nasal defect.  The left auricle was infiltrated with local anesthetic after marking a crescentic incision to the antihelix.  A 15 blade was used to make the incision exposing the auricular conchal cartilage.  Double-pronged skin hooks were applied and a sub-perichondrial plane was dissected to expose the conchal cartilage.  A 22 x 8 mm fusiform conchal cartilage graft was then harvested with a 15 blade.  This was set aside in saline.  The wound bed was irrigated copiously with saline and closed with a running 5-0 chromic suture.  Next a cotton roll posterior was secured to the wound bed with a 2-0 silk to reduce risk of auricular hematoma.  The ear was then protected with a Tegaderm for the rest of surgery.  The auricular cartilage graft graft was then inset into the left nasal Mohs defect in a nonanatomic location along the alar rim.  Medially this was secured to the lower lateral cartilage tip with interrupted 5-0 PDS  sutures.  Laterally it was secured to the SMAS after dissecting a precise pocket at the piriform aperture.  2 separate 5-0 chromic mattress sutures were used to reapposed the internal lining mucosa to the cartilage.  I then proceeded with repair of the soft tissue defect creating a 7 x 1.5 cm interpolated pedicled left-sided nasolabial flap using the pre-existing nasolabial crease to mark out the donor site.  After local anesthetic was judiciously infiltrated into the cheek and lip a 15 blade was used to harvest the flap in a subcutaneous plane staying above the facial musculature.  A broad pedicle was preserved.  The flap was inset into the nasal defect using buried interrupted 5-0 Monocryl sutures, interrupted 6-0 nylon for skin closure and interrupted 5-0 chromic along the alar margin.  This was considered a watertight closure to protect the auricular cartilage graft beneath.  The donor site was then dissected in a subcutaneous plane superiorly and inferiorly about 1 cm to allow for tension-free closure again staying above the facial musculature.  Hemostasis was achieved with bipolar cautery.  The donor site was closed with buried interrupted 4-0 Vicryl sutures and interrupted 5-0 nylon sutures for skin.  The flap was  then visualized and appeared to have great capillary refill and good color perfusion, warm and pink.  The wounds were cleansed and dressed.  The nasal pedicle was dressed with Xeroform and Surgiflo.  All the wounds were then covered with bacitracin , Telfa and brown paper tape.  The patient was then turned back to the anesthetist extubated him around to the recovery room in stable condition.  Marijane Shoulders, MD [facility]  02/07/2024

## 2024-02-07 NOTE — Anesthesia Procedure Notes (Signed)
 Procedure Name: Intubation Date/Time: 02/07/2024 1:30 PM  Performed by: Artemisa Bile D, CRNAPre-anesthesia Checklist: Patient identified, Emergency Drugs available, Suction available and Patient being monitored Patient Re-evaluated:Patient Re-evaluated prior to induction Oxygen Delivery Method: Circle System Utilized Preoxygenation: Pre-oxygenation with 100% oxygen Induction Type: IV induction Ventilation: Mask ventilation without difficulty Laryngoscope Size: Mac and 4 Grade View: Grade I Tube type: Oral Tube size: 7.5 mm Number of attempts: 1 Airway Equipment and Method: Stylet and Oral airway Placement Confirmation: ETT inserted through vocal cords under direct vision, positive ETCO2 and breath sounds checked- equal and bilateral Secured at: 23 cm Tube secured with: Tape Dental Injury: Teeth and Oropharynx as per pre-operative assessment

## 2024-02-07 NOTE — Transfer of Care (Signed)
 Immediate Anesthesia Transfer of Care Note  Patient: Don Johnson  Procedure(s) Performed: EXCISION, MASS, NOSE (Left: Face) LYMPH NODE BIOPSY (Bilateral: Neck)  Patient Location: PACU  Anesthesia Type:General  Level of Consciousness: drowsy  Airway & Oxygen Therapy: Patient Spontanous Breathing and Patient connected to face mask oxygen  Post-op Assessment: Report given to RN and Post -op Vital signs reviewed and stable  Post vital signs: Reviewed and stable  Last Vitals:  Vitals Value Taken Time  BP 130/60 02/07/24 1600  Temp    Pulse 58 02/07/24 1601  Resp 10 02/07/24 1601  SpO2 99 % 02/07/24 1601  Vitals shown include unfiled device data.  Last Pain:  Vitals:   02/07/24 1133  TempSrc:   PainSc: 2          Complications: No notable events documented.

## 2024-02-07 NOTE — H&P (Signed)
 Don Johnson is an 83 y.o. male.    Chief Complaint:  Nasal melanoma   HPI: Patient presents today for planned elective procedure.  He/she denies any interval change in history since office visit on 01/14/24. He underwent MOHS excision yesterday. He underwent nuclear lymphoscintigraphy today.   Past Medical History:  Diagnosis Date   Acquired deformity of nose    Bilateral recurrent inguinal hernia    BPH with obstruction/lower urinary tract symptoms    Cough 09/20/2016   DDD (degenerative disc disease), cervical    Diverticulosis of colon    GERD (gastroesophageal reflux disease)    Hiatal hernia    History of adenomatous polyp of colon    2006 tubular adenoma;  2010 tubular adenoma and hyperplastic   Hyperlipidemia    Hypertension    Malignant melanoma of nose (HCC)    OA (osteoarthritis)    hands   Organic sexual dysfunction    Pre-diabetes    S/P dilatation of esophageal stricture    multiple times --  last time w/ balloon 12-31-2015   Umbilical hernia    Wears dentures    upper denture, lower partial   Wears glasses     Past Surgical History:  Procedure Laterality Date   COLONOSCOPY  last one 03-14-2011   ESOPHAGOGASTRODUODENOSCOPY (EGD) WITH ESOPHAGEAL DILATION  last one 12-31-2015   GASTROCNEMIUS RECESSION Right 09/15/2021   Procedure: Gastroc recession;  Surgeon: Amada Backer, MD;  Location: [facility];  Service: Orthopedics;  Laterality: Right;   HAMMER TOE SURGERY Right 09/15/2021   Procedure: Right 2-4 metatarsal head resection; hammertoe correction;  Surgeon: Amada Backer, MD;  Location: [facility];  Service: Orthopedics;  Laterality: Right;   HEMORROIDECTOMY  1970's   INGUINAL HERNIA REPAIR Bilateral 1980's   PENILE PROSTHESIS IMPLANT N/A 09/29/2016   Procedure: PENILE PROTHESIS INFLATABLE;  Surgeon: Annamarie Kid, MD;  Location: [facility];  Service: Urology;  Laterality: N/A;   TOTAL HIP ARTHROPLASTY Left  11/01/2023   Procedure: TOTAL HIP ARTHROPLASTY ANTERIOR APPROACH;  Surgeon: Claiborne Crew, MD;  Location: [facility];  Service: Orthopedics;  Laterality: Left;   UMBILICAL HERNIA REPAIR  2007 approx    Family History  Problem Relation Age of Onset   Diabetes Mother    Colon polyps Mother    Arthritis Mother    Hypertension Mother    Hyperlipidemia Mother    Diabetes Sister    Leukemia Brother    COPD Brother    Pancreatic cancer Brother    Colon cancer Neg Hx    Stomach cancer Neg Hx     Social History:  reports that he has never smoked. He has never used smokeless tobacco. He reports that he does not drink alcohol and does not use drugs.  Allergies: No Known Allergies  Medications Prior to Admission  Medication Sig Dispense Refill   atorvastatin  (LIPITOR) 40 MG tablet TAKE ONE TABLET BY MOUTH EVERY DAY 90 tablet 0   indapamide  (LOZOL ) 1.25 MG tablet Take 1 tablet (1.25 mg total) by mouth daily. 90 tablet 0   Multiple Vitamin (MULTIVITAMIN WITH MINERALS) TABS tablet Take 1 tablet by mouth in the morning.     omeprazole  (PRILOSEC) 40 MG capsule Take 1 capsule (40 mg total) by mouth daily. 90 capsule 3    No results found for this or any previous visit (from the past 48 hours). No results found.  ROS: negative other than stated in HPI  Blood pressure (!) 198/91,  pulse (!) 57, temperature 97.7 F (36.5 C), temperature source Oral, resp. rate 20, height 5\' 6"  (1.676 m), weight 62.6 kg, SpO2 96%.  PHYSICAL EXAM: General: Resting comfortably in NAD  Nasal exam: ~1.3cm subtotal nasal alar defect, appears deep (left xeroform in place).  Neck: no lymphadenopathy  Lungs: Non-labored respiration  Studies Reviewed: NM Lymphoscintigraphy 01/30/24   IMPRESSION: 1. Following radiotracer injection of the melanoma site within the left side of nose there is radiotracer accumulation along the length of the left cervical nodal chain.     Electronically Signed   By: Kimberley Penman  M.D.   On: 01/31/2024 11:00    Assessment/Plan pT2a melanoma left nasal alal (depth at least 1.57mm) s/p mohs excision yesterday who presents for repair of left nasal mohs defect and sentinel lymph node biopsy.   Informed consent obtained. RBA discussed; risks discussed specifically pain, bleeding, infection, scarring, numbness, injury to cranial nerves VII, X, XI, XII with associated (facial weakness, dysphagia, hoarseness, shoulder droop, tongue weakness), phrenic nerve injury with associated difficulty breathing and diaphragm paralysis, injury to great vessels including the internal jugular vein and carotid artery with risk of stroke, hematoma, seroma, abscess, chyle leak, need for further procedures including neck dissection, risk of general anesthesia including heart attack, stroke, death, disability.    Electronically signed by:  Rush Coupe, MD  Staff Physician Facial Plastic & Reconstructive Surgery Otolaryngology - Head and Neck Surgery [facility]  02/07/2024, 11:16 AM

## 2024-02-07 NOTE — Discharge Instructions (Signed)

## 2024-02-08 ENCOUNTER — Encounter (HOSPITAL_COMMUNITY): Payer: Self-pay | Admitting: Otolaryngology

## 2024-02-13 LAB — SURGICAL PATHOLOGY

## 2024-02-15 DIAGNOSIS — C4331 Malignant melanoma of nose: Secondary | ICD-10-CM | POA: Diagnosis not present

## 2024-02-15 DIAGNOSIS — M950 Acquired deformity of nose: Secondary | ICD-10-CM | POA: Diagnosis not present

## 2024-02-19 ENCOUNTER — Telehealth: Payer: Self-pay | Admitting: Internal Medicine

## 2024-02-19 MED ORDER — OMEPRAZOLE 40 MG PO CPDR
40.0000 mg | DELAYED_RELEASE_CAPSULE | Freq: Every day | ORAL | 0 refills | Status: DC
Start: 2024-02-19 — End: 2024-04-01

## 2024-02-19 NOTE — Telephone Encounter (Signed)
 Inbound call for refill on pantoprazole . He scheduled an OV for 7/22 to get future refills. Please send refill to [facility] pharmacy in [place].

## 2024-02-19 NOTE — Telephone Encounter (Signed)
 Pantoprazole refilled.

## 2024-02-25 ENCOUNTER — Other Ambulatory Visit: Payer: Self-pay | Admitting: Internal Medicine

## 2024-02-25 DIAGNOSIS — E785 Hyperlipidemia, unspecified: Secondary | ICD-10-CM

## 2024-02-26 DIAGNOSIS — C4331 Malignant melanoma of nose: Secondary | ICD-10-CM | POA: Diagnosis not present

## 2024-02-26 DIAGNOSIS — M950 Acquired deformity of nose: Secondary | ICD-10-CM | POA: Diagnosis not present

## 2024-02-27 ENCOUNTER — Other Ambulatory Visit: Payer: Self-pay | Admitting: Otolaryngology

## 2024-03-05 ENCOUNTER — Other Ambulatory Visit: Payer: Self-pay

## 2024-03-05 ENCOUNTER — Encounter (HOSPITAL_BASED_OUTPATIENT_CLINIC_OR_DEPARTMENT_OTHER): Payer: Self-pay | Admitting: Otolaryngology

## 2024-03-11 NOTE — Anesthesia Preprocedure Evaluation (Signed)
 Anesthesia Evaluation  Patient identified by MRN, date of birth, ID band Patient awake    Reviewed: Allergy & Precautions, NPO status , Patient's Chart, lab work & pertinent test results  Airway Mallampati: II  TM Distance: >3 FB     Dental no notable dental hx. (+) Upper Dentures, Partial Lower   Pulmonary neg pulmonary ROS   Pulmonary exam normal breath sounds clear to auscultation       Cardiovascular hypertension, Pt. on medications Normal cardiovascular exam Rhythm:Regular Rate:Normal  EKG 11/23/23 NSR, Normal   Neuro/Psych Hx/o right pyriformis syndrome  Neuromuscular disease  negative psych ROS   GI/Hepatic Neg liver ROS, hiatal hernia,GERD  Medicated,,Hx/o diverticulosis Hx/o colon polyps Hx/o esophageal stricture S/P dilation   Endo/Other  negative endocrine ROS  Hyperlipidemia  Renal/GU negative Renal ROS   BPH negative genitourinary   Musculoskeletal  (+) Arthritis , Osteoarthritis,  Malignant melanoma of nose Acquired deformity of nose DDD cervical spine   Abdominal   Peds  Hematology negative hematology ROS (+)   Anesthesia Other Findings   Reproductive/Obstetrics                              Anesthesia Physical Anesthesia Plan  ASA: 3  Anesthesia Plan: General   Post-op Pain Management: Minimal or no pain anticipated   Induction: Intravenous  PONV Risk Score and Plan: 4 or greater and Treatment may vary due to age or medical condition and Ondansetron   Airway Management Planned: Oral ETT  Additional Equipment: None  Intra-op Plan:   Post-operative Plan: Extubation in OR  Informed Consent: I have reviewed the patients History and Physical, chart, labs and discussed the procedure including the risks, benefits and alternatives for the proposed anesthesia with the patient or authorized representative who has indicated his/her understanding and acceptance.      Dental advisory given  Plan Discussed with: CRNA and Anesthesiologist  Anesthesia Plan Comments:          Anesthesia Quick Evaluation

## 2024-03-12 ENCOUNTER — Other Ambulatory Visit: Payer: Self-pay

## 2024-03-12 ENCOUNTER — Encounter (HOSPITAL_BASED_OUTPATIENT_CLINIC_OR_DEPARTMENT_OTHER): Payer: Self-pay | Admitting: Otolaryngology

## 2024-03-12 ENCOUNTER — Encounter (HOSPITAL_BASED_OUTPATIENT_CLINIC_OR_DEPARTMENT_OTHER)
Admission: RE | Disposition: A | Discharge status: Home / Self Care | Payer: Self-pay | Source: Home / Self Care | Attending: Otolaryngology

## 2024-03-12 ENCOUNTER — Encounter (HOSPITAL_BASED_OUTPATIENT_CLINIC_OR_DEPARTMENT_OTHER): Payer: Self-pay | Admitting: Anesthesiology

## 2024-03-12 ENCOUNTER — Ambulatory Visit (HOSPITAL_BASED_OUTPATIENT_CLINIC_OR_DEPARTMENT_OTHER)
Admission: RE | Admit: 2024-03-12 | Discharge: 2024-03-12 | Disposition: A | Discharge status: Home / Self Care | Attending: Otolaryngology | Admitting: Otolaryngology

## 2024-03-12 DIAGNOSIS — C4331 Malignant melanoma of nose: Secondary | ICD-10-CM | POA: Insufficient documentation

## 2024-03-12 DIAGNOSIS — K449 Diaphragmatic hernia without obstruction or gangrene: Secondary | ICD-10-CM | POA: Insufficient documentation

## 2024-03-12 DIAGNOSIS — M950 Acquired deformity of nose: Secondary | ICD-10-CM | POA: Insufficient documentation

## 2024-03-12 DIAGNOSIS — Z01818 Encounter for other preprocedural examination: Secondary | ICD-10-CM

## 2024-03-12 DIAGNOSIS — I10 Essential (primary) hypertension: Secondary | ICD-10-CM | POA: Diagnosis not present

## 2024-03-12 DIAGNOSIS — E785 Hyperlipidemia, unspecified: Secondary | ICD-10-CM

## 2024-03-12 DIAGNOSIS — K219 Gastro-esophageal reflux disease without esophagitis: Secondary | ICD-10-CM | POA: Diagnosis not present

## 2024-03-12 HISTORY — PX: NASAL FLAP ROTATION: SHX5366

## 2024-03-12 SURGERY — CREATION, FLAP, PEDICLED ROTATION, FOREHEAD AND NOSE
Anesthesia: General | Site: Nose | Laterality: Left

## 2024-03-12 MED ORDER — AMISULPRIDE (ANTIEMETIC) 5 MG/2ML IV SOLN: 10.0000 mg | Freq: Once | INTRAVENOUS | Status: DC | PRN

## 2024-03-12 MED ORDER — LIDOCAINE-EPINEPHRINE 1 %-1:100000 IJ SOLN
INTRAMUSCULAR | Status: DC | PRN
  Administered 2024-03-12: 5 mL

## 2024-03-12 MED ORDER — ROCURONIUM BROMIDE 10 MG/ML (PF) SYRINGE
PREFILLED_SYRINGE | INTRAVENOUS | Status: AC
  Filled 2024-03-12: qty 10

## 2024-03-12 MED ORDER — OXYCODONE HCL 5 MG PO TABS
ORAL_TABLET | ORAL | Status: AC
  Filled 2024-03-12: qty 1

## 2024-03-12 MED ORDER — SUGAMMADEX SODIUM 200 MG/2ML IV SOLN
INTRAVENOUS | Status: DC | PRN
Start: 2024-03-12 — End: 2024-03-12
  Administered 2024-03-12: 200 mg via INTRAVENOUS

## 2024-03-12 MED ORDER — BUPIVACAINE HCL (PF) 0.25 % IJ SOLN
INTRAMUSCULAR | Status: AC
  Filled 2024-03-12: qty 30

## 2024-03-12 MED ORDER — BACITRACIN ZINC 500 UNIT/GM EX OINT
TOPICAL_OINTMENT | CUTANEOUS | Status: DC | PRN
Start: 2024-03-12 — End: 2024-03-12
  Administered 2024-03-12: 1 via TOPICAL

## 2024-03-12 MED ORDER — HYDROMORPHONE HCL 1 MG/ML IJ SOLN: 0.2500 mg | INTRAMUSCULAR | Status: DC | PRN

## 2024-03-12 MED ORDER — LIDOCAINE-EPINEPHRINE 1 %-1:100000 IJ SOLN
INTRAMUSCULAR | Status: AC
  Filled 2024-03-12: qty 1

## 2024-03-12 MED ORDER — SODIUM BICARBONATE 4.2 % IV SOLN
INTRAVENOUS | Status: AC
  Filled 2024-03-12: qty 10

## 2024-03-12 MED ORDER — CEFAZOLIN SODIUM-DEXTROSE 2-4 GM/100ML-% IV SOLN
INTRAVENOUS | Status: AC
  Filled 2024-03-12: qty 100

## 2024-03-12 MED ORDER — LIDOCAINE 2% (20 MG/ML) 5 ML SYRINGE
INTRAMUSCULAR | Status: DC | PRN
Start: 2024-03-12 — End: 2024-03-12
  Administered 2024-03-12: 80 mg via INTRAVENOUS

## 2024-03-12 MED ORDER — ROCURONIUM BROMIDE 10 MG/ML (PF) SYRINGE
PREFILLED_SYRINGE | INTRAVENOUS | Status: DC | PRN
Start: 2024-03-12 — End: 2024-03-12
  Administered 2024-03-12: 50 mg via INTRAVENOUS

## 2024-03-12 MED ORDER — DEXAMETHASONE SODIUM PHOSPHATE 10 MG/ML IJ SOLN
INTRAMUSCULAR | Status: DC | PRN
  Administered 2024-03-12: 4 mg via INTRAVENOUS

## 2024-03-12 MED ORDER — CEFAZOLIN SODIUM-DEXTROSE 2-3 GM-%(50ML) IV SOLR
INTRAVENOUS | Status: DC | PRN
  Administered 2024-03-12: 2 g via INTRAVENOUS

## 2024-03-12 MED ORDER — 0.9 % SODIUM CHLORIDE (POUR BTL) OPTIME
TOPICAL | Status: DC | PRN
  Administered 2024-03-12: 1000 mL

## 2024-03-12 MED ORDER — OXYCODONE HCL 5 MG PO TABS
5.0000 mg | ORAL_TABLET | Freq: Once | ORAL | Status: AC | PRN
  Administered 2024-03-12: 5 mg via ORAL

## 2024-03-12 MED ORDER — FENTANYL CITRATE (PF) 100 MCG/2ML IJ SOLN
INTRAMUSCULAR | Status: AC
  Filled 2024-03-12: qty 2

## 2024-03-12 MED ORDER — PROPOFOL 500 MG/50ML IV EMUL
INTRAVENOUS | Status: AC
  Filled 2024-03-12: qty 50

## 2024-03-12 MED ORDER — PROPOFOL 10 MG/ML IV BOLUS
INTRAVENOUS | Status: DC | PRN
  Administered 2024-03-12: 100 mg via INTRAVENOUS

## 2024-03-12 MED ORDER — LACTATED RINGERS IV SOLN: INTRAVENOUS | Status: DC | PRN

## 2024-03-12 MED ORDER — DEXAMETHASONE SODIUM PHOSPHATE 10 MG/ML IJ SOLN
INTRAMUSCULAR | Status: AC
  Filled 2024-03-12: qty 1

## 2024-03-12 MED ORDER — FENTANYL CITRATE (PF) 100 MCG/2ML IJ SOLN
INTRAMUSCULAR | Status: DC | PRN
  Administered 2024-03-12: 100 ug via INTRAVENOUS

## 2024-03-12 MED ORDER — ONDANSETRON HCL 4 MG/2ML IJ SOLN
INTRAMUSCULAR | Status: AC
  Filled 2024-03-12: qty 2

## 2024-03-12 MED ORDER — LIDOCAINE 2% (20 MG/ML) 5 ML SYRINGE
INTRAMUSCULAR | Status: AC
  Filled 2024-03-12: qty 5

## 2024-03-12 MED ORDER — BSS IO SOLN
INTRAOCULAR | Status: AC
  Filled 2024-03-12: qty 15

## 2024-03-12 MED ORDER — ONDANSETRON HCL 4 MG/2ML IJ SOLN: 4.0000 mg | Freq: Once | INTRAMUSCULAR | Status: DC | PRN

## 2024-03-12 MED ORDER — LACTATED RINGERS IV SOLN: INTRAVENOUS | Status: DC

## 2024-03-12 MED ORDER — OXYCODONE HCL 5 MG/5ML PO SOLN: 5.0000 mg | Freq: Once | ORAL | Status: AC | PRN

## 2024-03-12 MED ORDER — CEFAZOLIN SODIUM-DEXTROSE 2-4 GM/100ML-% IV SOLN: 2.0000 g | INTRAVENOUS | Status: DC

## 2024-03-12 MED ORDER — EPHEDRINE SULFATE-NACL 50-0.9 MG/10ML-% IV SOSY
PREFILLED_SYRINGE | INTRAVENOUS | Status: DC | PRN
  Administered 2024-03-12 (×3): 5 mg via INTRAVENOUS

## 2024-03-12 MED ORDER — ONDANSETRON HCL 4 MG/2ML IJ SOLN
INTRAMUSCULAR | Status: DC | PRN
  Administered 2024-03-12: 4 mg via INTRAVENOUS

## 2024-03-12 MED ORDER — BACITRACIN ZINC 500 UNIT/GM EX OINT
TOPICAL_OINTMENT | CUTANEOUS | Status: AC
  Filled 2024-03-12: qty 28.35

## 2024-03-12 MED ORDER — DEXMEDETOMIDINE HCL IN NACL 80 MCG/20ML IV SOLN
INTRAVENOUS | Status: DC | PRN
  Administered 2024-03-12 (×4): 2 ug via INTRAVENOUS

## 2024-03-12 SURGICAL SUPPLY — 64 items
APPLICATOR COTTON TIP 6 STRL (MISCELLANEOUS) IMPLANT
APPLICATOR DR MATTHEWS STRL (MISCELLANEOUS) IMPLANT
BLADE SURG 15 STRL LF DISP TIS (BLADE) ×4 IMPLANT
CANISTER SUCT 1200ML W/VALVE (MISCELLANEOUS) ×2 IMPLANT
COAGULATOR SUCT 8FR VV (MISCELLANEOUS) IMPLANT
CORD BIPOLAR FORCEPS 12FT (ELECTRODE) IMPLANT
COTTONBALL LRG STERILE PKG (GAUZE/BANDAGES/DRESSINGS) IMPLANT
DRESSING NASAL POPE 10X1.5X2.5 (GAUZE/BANDAGES/DRESSINGS) ×2 IMPLANT
DRSG TEGADERM 2-3/8X2-3/4 SM (GAUZE/BANDAGES/DRESSINGS) ×2 IMPLANT
DRSG TEGADERM 4X4.75 (GAUZE/BANDAGES/DRESSINGS) IMPLANT
DRSG TELFA 3X8 NADH STRL (GAUZE/BANDAGES/DRESSINGS) ×2 IMPLANT
ELECT COATED BLADE 2.86 ST (ELECTRODE) IMPLANT
ELECT NDL BLADE 2-5/6 (NEEDLE) IMPLANT
ELECT NEEDLE BLADE 2-5/6 (NEEDLE) IMPLANT
ELECTRODE REM PT RTRN 9FT ADLT (ELECTROSURGICAL) ×2 IMPLANT
GAUZE SPONGE 2X2 STRL 8-PLY (GAUZE/BANDAGES/DRESSINGS) IMPLANT
GAUZE VASELINE FOILPK 1/2 X 72 (GAUZE/BANDAGES/DRESSINGS) IMPLANT
GLOVE BIO SURGEON STRL SZ7.5 (GLOVE) ×2 IMPLANT
GLOVE BIOGEL PI IND STRL 6.5 (GLOVE) IMPLANT
GLOVE BIOGEL PI IND STRL 7.0 (GLOVE) IMPLANT
GLOVE BIOGEL PI IND STRL 8 (GLOVE) ×2 IMPLANT
GOWN STRL REUS W/ TWL LRG LVL3 (GOWN DISPOSABLE) ×2 IMPLANT
GOWN STRL REUS W/ TWL XL LVL3 (GOWN DISPOSABLE) ×2 IMPLANT
HEMOSTAT SURGICEL .5X2 ABSORB (HEMOSTASIS) IMPLANT
MARKER SKIN DUAL TIP RULER LAB (MISCELLANEOUS) IMPLANT
NDL PRECISIONGLIDE 27X1.5 (NEEDLE) ×2 IMPLANT
NEEDLE PRECISIONGLIDE 27X1.5 (NEEDLE) ×1 IMPLANT
NS IRRIG 1000ML POUR BTL (IV SOLUTION) ×2 IMPLANT
PACK BASIN DAY SURGERY FS (CUSTOM PROCEDURE TRAY) ×2 IMPLANT
PACK ENT DAY SURGERY (CUSTOM PROCEDURE TRAY) ×2 IMPLANT
PENCIL SMOKE EVACUATOR (MISCELLANEOUS) ×2 IMPLANT
SHEET MEDIUM DRAPE 40X70 STRL (DRAPES) IMPLANT
SHIELD EYE MED CORNL SHD 22X21 (OPHTHALMIC RELATED) IMPLANT
SLEEVE SCD COMPRESS KNEE MED (STOCKING) ×2 IMPLANT
SOL PREP POV-IOD 4OZ 10% (MISCELLANEOUS) IMPLANT
SPIKE FLUID TRANSFER (MISCELLANEOUS) IMPLANT
SPLINT NASAL DENVER LRG BLUSH (MISCELLANEOUS) IMPLANT
SPLINT NASAL DENVER SM/MD BLUS (MISCELLANEOUS) IMPLANT
SPONGE SURGIFOAM ABS GEL 12-7 (HEMOSTASIS) IMPLANT
SPONGE T-LAP 18X18 ~~LOC~~+RFID (SPONGE) IMPLANT
STAPLER SKIN PROX WIDE 3.9 (STAPLE) IMPLANT
SUCTION TUBE FRAZIER 10FR DISP (SUCTIONS) IMPLANT
SUT CHROMIC 5 0 P 3 (SUTURE) IMPLANT
SUT ETHILON 5 0 PS 2 18 (SUTURE) IMPLANT
SUT ETHILON 6 0 P 1 (SUTURE) IMPLANT
SUT ETHILON 6 0 PS 3 18 (SUTURE) IMPLANT
SUT MON AB 5-0 P3 18 (SUTURE) IMPLANT
SUT PDS II 5-0 VIOLET 1X18 TF (SUTURE) IMPLANT
SUT PLAIN 6 0 TG1408 (SUTURE) IMPLANT
SUT PLAIN GUT FAST 5-0 (SUTURE) IMPLANT
SUT SILK 3 0 PS 1 (SUTURE) IMPLANT
SUT SILK 3 0 SH CR/8 (SUTURE) IMPLANT
SUT SILK 4 0 PS 2 (SUTURE) IMPLANT
SUT VIC AB 3-0 PS2 18XBRD (SUTURE) IMPLANT
SUT VIC AB 4-0 PS2 18 (SUTURE) IMPLANT
SUT VICRYL RAPID 5 0 P 3 (SUTURE) IMPLANT
SYR 10ML LL (SYRINGE) IMPLANT
SYR 3ML 23GX1 SAFETY (SYRINGE) ×2 IMPLANT
SYR BULB EAR ULCER 3OZ GRN STR (SYRINGE) IMPLANT
TAPE PAPER 1/2X10 TAN MEDIPORE (MISCELLANEOUS) IMPLANT
TOWEL GREEN STERILE FF (TOWEL DISPOSABLE) ×4 IMPLANT
TRAY DSU PREP LF (CUSTOM PROCEDURE TRAY) ×2 IMPLANT
TUBE SALEM SUMP 16F (TUBING) IMPLANT
YANKAUER SUCT BULB TIP NO VENT (SUCTIONS) IMPLANT

## 2024-03-12 NOTE — Discharge Instructions (Addendum)
 Post-operative Patient Instructions Don Johnson. Hoshal MD  Surgery What to expect: A bandage will be placed on your surgical sites. You can leave the bandages in place until you return to the clinic. You may be scheduled for a series of wound care appointments over the next month.  Recovery/Restrictions: -No strenuous activity for at least the first week after your procedure -Bruising and swelling are expected and will take weeks to go away (consider using ice packs) -Please contact our office immediately if you experience any signs/symptoms of infection (redness, pain, or fever of 100.4F or greater)  Wound Wound care: The goal is to keep your wounds clean and moist to prevent scabs or crusts. You will keep your current post-operative dressing in place undisturbed until after your first post-operative clinic visit. The following instructions apply after your first post-operative visit.  1. Clean wound wounds with soap and water using a cue tip if any crusts are present  2. Next, apply a thin layer of Aquaphor ointment 3. Apply Telfa and cover with brown tape  4. If you had an ear surgery - please apply a thin layer of antibiotic ointment or Aquaphor ointment to your ear incision every day  Care Healing Period: For the best healing, please protect the area from the sun   You may be asked to begin massaging the scars several weeks after surgery. Scars can be massaged in horizontal, vertical, and circular motions.   Adult Post-Operative Pain Management  Pain medication is given immediately following your surgery to help with post-operative pain. Do not wake up or set an alarm to wake up and take pain medications. Sleep and rest.  Upon your discharge home, we suggest scheduled doses of Acetaminophen (Tylenol) every 6 hours and Ibuprofen (Motrin) every 6 hours, alternating between medications every 3 hours (i.e. Take Tylenol and wait 3 hours, then take Motrin and wait 3 hours, repeat) for the  first 3-4 days after surgery. If you are without significant pain, medications can be taken more infrequently. It is important to follow dosing instructions on the medication bottle or prescription.   Sample of medication dosing schedule  Give dose of: Time: Given:  Acetaminophen 12 a.m.   Ibuprofen 3 a.m.   Acetaminophen 6 a.m.   Ibuprofen 9 a.m.   Acetaminophen 12 p.m.   Ibuprofen 3 p.m.   Acetaminophen 6 p.m.   Ibuprofen 9 p.m.    If you need to call after clinic hours for a concern, call [phone] and ask for the "physician on call for ENT."  [address] [place], [place] [address] Phone: [phone]    Post Anesthesia Home Care Instructions  Activity: Get plenty of rest for the remainder of the day. A responsible individual must stay with you for 24 hours following the procedure.  For the next 24 hours, DO NOT: -Drive a car -Advertising copywriter -Drink alcoholic beverages -Take any medication unless instructed by your physician -Make any legal decisions or sign important papers.  Meals: Start with liquid foods such as gelatin or soup. Progress to regular foods as tolerated. Avoid greasy, spicy, heavy foods. If nausea and/or vomiting occur, drink only clear liquids until the nausea and/or vomiting subsides. Call your physician if vomiting continues.  Special Instructions/Symptoms: Your throat may feel dry or sore from the anesthesia or the breathing tube placed in your throat during surgery. If this causes discomfort, gargle with warm salt water. The discomfort should disappear within 24 hours.  If you had a  scopolamine patch placed behind your ear for the management of post- operative nausea and/or vomiting:  1. The medication in the patch is effective for 72 hours, after which it should be removed.  Wrap patch in a tissue and discard in the trash. Wash hands thoroughly with soap and water. 2. You may remove the patch earlier than 72 hours if you experience  unpleasant side effects which may include dry mouth, dizziness or visual disturbances. 3. Avoid touching the patch. Wash your hands with soap and water after contact with the patch.

## 2024-03-12 NOTE — Anesthesia Postprocedure Evaluation (Signed)
 Anesthesia Post Note  Patient: Don Johnson  Procedure(s) Performed: DIVISION AND INSET OF LEFT NASOLABIAL FLAP (Left: Nose)     Patient location during evaluation: PACU Anesthesia Type: General Level of consciousness: awake and alert and oriented Pain management: pain level controlled Vital Signs Assessment: post-procedure vital signs reviewed and stable Respiratory status: spontaneous breathing, nonlabored ventilation and respiratory function stable Cardiovascular status: blood pressure returned to baseline and stable Postop Assessment: no apparent nausea or vomiting Anesthetic complications: no   No notable events documented.  Last Vitals:  Vitals:   03/12/24 1000 03/12/24 1036  BP: (!) 147/80 (!) 152/81  Pulse: 72 71  Resp: 10 16  Temp:  (!) 36.2 C  SpO2: 100% 100%    Last Pain:  Vitals:   03/12/24 1036  TempSrc: Temporal  PainSc: 3                  Trenese Haft A.

## 2024-03-12 NOTE — Anesthesia Procedure Notes (Signed)
 Procedure Name: Intubation Date/Time: 03/12/2024 8:59 AM  Performed by: Leotha Andrez DEL, CRNAPre-anesthesia Checklist: Patient identified, Emergency Drugs available, Suction available and Patient being monitored Patient Re-evaluated:Patient Re-evaluated prior to induction Oxygen Delivery Method: Circle System Utilized Preoxygenation: Pre-oxygenation with 100% oxygen Induction Type: IV induction Ventilation: Mask ventilation without difficulty and Oral airway inserted - appropriate to patient size Laryngoscope Size: Mac and 3 Grade View: Grade I Tube type: Oral Tube size: 7.0 mm Number of attempts: 1 Airway Equipment and Method: Stylet and Oral airway Placement Confirmation: ETT inserted through vocal cords under direct vision, positive ETCO2 and breath sounds checked- equal and bilateral Secured at: 22 cm Tube secured with: Tape Dental Injury: Teeth and Oropharynx as per pre-operative assessment

## 2024-03-12 NOTE — Transfer of Care (Signed)
 Immediate Anesthesia Transfer of Care Note  Patient: Don Johnson  Procedure(s) Performed: DIVISION AND INSET OF LEFT NASOLABIAL FLAP (Left: Nose)  Patient Location: PACU  Anesthesia Type:General  Level of Consciousness: awake and alert   Airway & Oxygen Therapy: Patient Spontanous Breathing  Post-op Assessment: Report given to RN and Post -op Vital signs reviewed and unstable, Anesthesiologist notified  Post vital signs: Reviewed and stable  Last Vitals:  Vitals Value Taken Time  BP 175/88 03/12/24 09:55  Temp 36.4 C 03/12/24 09:55  Pulse 71 03/12/24 09:59  Resp 12 03/12/24 09:59  SpO2 100 % 03/12/24 09:59  Vitals shown include unfiled device data.  Last Pain:  Vitals:   03/12/24 0955  TempSrc:   PainSc: 0-No pain      Patients Stated Pain Goal: 2 (03/12/24 0725)  Complications: No notable events documented.

## 2024-03-12 NOTE — H&P (Signed)
 Don Johnson is an 83 y.o. male.    Chief Complaint:  Nasal deformity  HPI: Patient presents today for planned elective procedure.  He/she denies any interval change in history since office visit on 02/26/24.   Past Medical History:  Diagnosis Date   Acquired deformity of nose    Bilateral recurrent inguinal hernia    BPH with obstruction/lower urinary tract symptoms    Cough 09/20/2016   DDD (degenerative disc disease), cervical    Diverticulosis of colon    GERD (gastroesophageal reflux disease)    Hiatal hernia    History of adenomatous polyp of colon    2006 tubular adenoma;  2010 tubular adenoma and hyperplastic   Hyperlipidemia    Hypertension    Malignant melanoma of nose (HCC)    OA (osteoarthritis)    hands   Organic sexual dysfunction    Pre-diabetes    S/P dilatation of esophageal stricture    multiple times --  last time w/ balloon 12-31-2015   Umbilical hernia    Wears dentures    upper denture, lower partial   Wears glasses     Past Surgical History:  Procedure Laterality Date   COLONOSCOPY  last one 03-14-2011   ESOPHAGOGASTRODUODENOSCOPY (EGD) WITH ESOPHAGEAL DILATION  last one 12-31-2015   EXCISION NASAL MASS Left 02/07/2024   Procedure: EXCISION, MASS, NOSE;  Surgeon: Luciano Standing, MD;  Location: [facility];  Service: ENT;  Laterality: Left;  Excision and repair of Mohs defect on left nose with adjacent tissue transfer; possible skin graft from face or ear; possible forehead flap; possible nasolabial flap; possible ear cartilage graft   GASTROCNEMIUS RECESSION Right 09/15/2021   Procedure: Gastroc recession;  Surgeon: Kit Rush, MD;  Location: [facility];  Service: Orthopedics;  Laterality: Right;   HAMMER TOE SURGERY Right 09/15/2021   Procedure: Right 2-4 metatarsal head resection; hammertoe correction;  Surgeon: Kit Rush, MD;  Location: [facility];  Service: Orthopedics;  Laterality: Right;   HEMORROIDECTOMY  1970's    INGUINAL HERNIA REPAIR Bilateral 1980's   LYMPH NODE BIOPSY Bilateral 02/07/2024   Procedure: LYMPH NODE BIOPSY;  Surgeon: Luciano Standing, MD;  Location: [facility] OR;  Service: ENT;  Laterality: Bilateral;  Bilateral Neck Sentinel Lymph Node Biopsy   PENILE PROSTHESIS IMPLANT N/A 09/29/2016   Procedure: PENILE PROTHESIS INFLATABLE;  Surgeon: Arlena Gal, MD;  Location: [facility];  Service: Urology;  Laterality: N/A;   TOTAL HIP ARTHROPLASTY Left 11/01/2023   Procedure: TOTAL HIP ARTHROPLASTY ANTERIOR APPROACH;  Surgeon: Ernie Cough, MD;  Location: [facility];  Service: Orthopedics;  Laterality: Left;   UMBILICAL HERNIA REPAIR  2007 approx    Family History  Problem Relation Age of Onset   Diabetes Mother    Colon polyps Mother    Arthritis Mother    Hypertension Mother    Hyperlipidemia Mother    Diabetes Sister    Leukemia Brother    COPD Brother    Pancreatic cancer Brother    Colon cancer Neg Hx    Stomach cancer Neg Hx     Social History:  reports that he has never smoked. He has never used smokeless tobacco. He reports that he does not drink alcohol and does not use drugs.  Allergies: No Known Allergies  Medications Prior to Admission  Medication Sig Dispense Refill   atorvastatin  (LIPITOR) 40 MG tablet TAKE ONE TABLET BY MOUTH EVERY DAY 90 tablet 0   indapamide  (LOZOL ) 1.25 MG tablet  Take 1.25 mg by mouth daily.     Multiple Vitamin (MULTIVITAMIN WITH MINERALS) TABS tablet Take 1 tablet by mouth in the morning.     omeprazole  (PRILOSEC) 40 MG capsule Take 1 capsule (40 mg total) by mouth daily. 90 capsule 0    No results found for this or any previous visit (from the past 48 hours). No results found.  ROS: negative other than stated in HPI  Blood pressure (!) 170/77, pulse (!) 52, temperature (!) 97.4 F (36.3 C), temperature source Temporal, resp. rate 18, height 5' 6 (1.676 m), weight 61.4 kg, SpO2 100%.  PHYSICAL EXAM: General: Resting comfortably  in NAD  Lungs: Non-labored respiratinos  Studies Reviewed: None   Assessment/Plan Acquired nasal deformity  Proceed with division and inset of nasolabial flap. Informed consent obtained. RBA discussed    Electronically signed by:  Elspeth Coddington, MD  Staff Physician Facial Plastic & Reconstructive Surgery Otolaryngology - Head and Neck Surgery [facility]  03/12/2024, 8:33 AM

## 2024-03-12 NOTE — Op Note (Signed)
 OPERATIVE NOTE  Don Johnson Date/Time of Admission: 03/12/2024  7:00 AM  CSN: [id number];FMW:[id number] Attending Provider: Luciano Standing, MD Room/Bed: [facility]/NONE DOB: Jun 17, 1941 Age: 83 y.o.   Pre-Op Diagnosis: Malignant melanoma of nose; Acquired deformity of nose  Post-Op Diagnosis: Malignant melanoma of nose; Acquired deformity of nose  Procedure: Procedure(s): DIVISION AND INSET OF LEFT NASOLABIAL FLAP CPT 15630  Anesthesia: General  Surgeon(s): Standing KANDICE Luciano, MD  Staff: Circulator: [place], Hadassah Buel RAMAN, RN Scrub Person: Celestia Barer K  Implants: * No implants in log *  Specimens: * No specimens in log *  Complications: NONE  EBL: 10 ML  IVF: Per anesthesia ML  Condition: stable  Operative Findings:  Viable nasolabial flap   Description of Operation:  The patient was identified in the preoperative area and consent confirmed in the chart.  The patient was brought to the operating room by the anesthetist and a preoperative huddle was performed confirming the patient's identity and procedure to be performed.  Once all were in agreement we proceeded with surgery.  General anesthesia was induced and the patient was intubated with a ETT.  This was secured.  The patient's bandages were removed demonstrating the findings as noted above. The cheek and nose were anesthetized with lidocaine  and epinephrine .   The patient was prepped and draped in standard fashion for procedure of this kind.  A final preoperative pause was performed and we proceeded with surgery.   We began by clamping the nasolabial flap interpolated pedicle with a hemostat.  There was adequate perfusion and good capillary refill of the nasolabial flap despite clamping the pedicle.  Given this the nasolabial flap pedicle was divided sharply with a 15 blade.  Double-pronged skin hooks were used to sharply incise the nasolabial flap proximal stump at the cheek creating a subcutaneous plane for  closure.  The subcutaneous tissue and granulation tissues were discarded.  A small V wedge was created in the cheek donor site scar closure site to allow for inset of the cheek flap.  This was closed with buried interrupted 5-0 Monocryl and interrupted 6-0 nylon for an aesthetic closure.   Next we proceeded with insetting the nasolabial flap along the dorsum of the nasal Mohs defect.  Again a 15 blade and appropriate tension with double-pronged skin hooks were used to incise the flaps of the flap laterally removing the excess subcutaneous tissue.  The recipient site was sharply incised to create fresh edges for closure.  Next the nasolabial flap was adequately trimmed and contoured to the recipient site and closed with buried interrupted 5-0 Monocryl and interrupted 6-0 nylon sutures.    The wounds were dressed with bacitracin , telfa, and brown paper tape.   The procedures were completed without complication and tolerated well.     Standing KANDICE Luciano, MD [facility] ENT  03/12/2024

## 2024-03-13 ENCOUNTER — Encounter (HOSPITAL_BASED_OUTPATIENT_CLINIC_OR_DEPARTMENT_OTHER): Payer: Self-pay | Admitting: Otolaryngology

## 2024-03-19 DIAGNOSIS — C4331 Malignant melanoma of nose: Secondary | ICD-10-CM | POA: Diagnosis not present

## 2024-03-19 DIAGNOSIS — M950 Acquired deformity of nose: Secondary | ICD-10-CM | POA: Diagnosis not present

## 2024-03-31 NOTE — Progress Notes (Signed)
 Ellouise Console, PA-C [address] [place], [place]  [address] Phone: [phone]   Primary Care Physician: Joshua Debby CROME, MD  Primary Gastroenterologist:  Ellouise Console, PA-C / Norleen Kiang, MD   Chief Complaint:  F/U GERD       HPI:   Don Johnson is a 83 y.o. male, established patient Dr. Kiang, presents for annual follow-up of GERD.  Needs refill of omeprazole  40 Mg once daily.  He takes it every day with good control of acid reflux.  He has been on PPI since 2007.  He tried decreasing omeprazole  to every other day, however it did not control his reflux.  He is currently taking it every day with great benefit.  He denies dysphagia or breakthrough heartburn.  No GI symptoms today.  Recent labs 01/2024 showed normal CBC, BMP, and iron.  GFR greater than 60.  Hgb 13.6.  He last saw Dr. Kiang for follow-up 02/2022.  History of peptic stricture requiring esophageal dilations 2010 and 2017.  Last EGD 12/2015: distal esophageal stenosis measuring 15 mm diameter.  Dilated with TTS dilator to 20 mm.  3 cm hiatal hernia.  Multiple duodenal erosions.  Normal stomach.  Last colonoscopy 04/2017: 1 small 2 mm tubular adenoma polyp removed.  No further surveillance colonoscopies recommended due to advanced age.  Has had inguinal hernia repair and hemorrhoidectomy.  Also has small ventral hernia.  Current Outpatient Medications  Medication Sig Dispense Refill   atorvastatin  (LIPITOR) 40 MG tablet TAKE ONE TABLET BY MOUTH EVERY DAY 90 tablet 0   indapamide  (LOZOL ) 1.25 MG tablet Take 1.25 mg by mouth daily.     Multiple Vitamin (MULTIVITAMIN WITH MINERALS) TABS tablet Take 1 tablet by mouth in the morning.     omeprazole  (PRILOSEC) 40 MG capsule Take 1 capsule (40 mg total) by mouth daily. 90 capsule 3   No current facility-administered medications for this visit.    Allergies as of 04/01/2024   (No Known Allergies)    Past Medical History:  Diagnosis Date   Acquired deformity of  nose    Bilateral recurrent inguinal hernia    BPH with obstruction/lower urinary tract symptoms    Cough 09/20/2016   DDD (degenerative disc disease), cervical    Diverticulosis of colon    GERD (gastroesophageal reflux disease)    Hiatal hernia    History of adenomatous polyp of colon    2006 tubular adenoma;  2010 tubular adenoma and hyperplastic   Hyperlipidemia    Hypertension    Malignant melanoma of nose (HCC)    OA (osteoarthritis)    hands   Organic sexual dysfunction    Pre-diabetes    S/P dilatation of esophageal stricture    multiple times --  last time w/ balloon 12-31-2015   Umbilical hernia    Wears dentures    upper denture, lower partial   Wears glasses     Past Surgical History:  Procedure Laterality Date   COLONOSCOPY  last one 03-14-2011   ESOPHAGOGASTRODUODENOSCOPY (EGD) WITH ESOPHAGEAL DILATION  last one 12-31-2015   EXCISION NASAL MASS Left 02/07/2024   Procedure: EXCISION, MASS, NOSE;  Surgeon: Luciano Standing, MD;  Location: [facility];  Service: ENT;  Laterality: Left;  Excision and repair of Mohs defect on left nose with adjacent tissue transfer; possible skin graft from face or ear; possible forehead flap; possible nasolabial flap; possible ear cartilage graft   GASTROCNEMIUS RECESSION Right 09/15/2021   Procedure: Gastroc recession;  Surgeon: Kit Rush, MD;  Location: [facility];  Service: Orthopedics;  Laterality: Right;   HAMMER TOE SURGERY Right 09/15/2021   Procedure: Right 2-4 metatarsal head resection; hammertoe correction;  Surgeon: Kit Rush, MD;  Location: [facility];  Service: Orthopedics;  Laterality: Right;   HEMORROIDECTOMY  1970's   INGUINAL HERNIA REPAIR Bilateral 1980's   LYMPH NODE BIOPSY Bilateral 02/07/2024   Procedure: LYMPH NODE BIOPSY;  Surgeon: Luciano Standing, MD;  Location: [facility] OR;  Service: ENT;  Laterality: Bilateral;  Bilateral Neck Sentinel Lymph Node Biopsy   NASAL FLAP ROTATION Left 03/12/2024    Procedure: DIVISION AND INSET OF LEFT NASOLABIAL FLAP;  Surgeon: Luciano Standing, MD;  Location: [facility];  Service: ENT;  Laterality: Left;  DIVISION AND INSET OF LEFT NASOLABIAL FLAP   PENILE PROSTHESIS IMPLANT N/A 09/29/2016   Procedure: PENILE PROTHESIS INFLATABLE;  Surgeon: Arlena Gal, MD;  Location: [facility];  Service: Urology;  Laterality: N/A;   TOTAL HIP ARTHROPLASTY Left 11/01/2023   Procedure: TOTAL HIP ARTHROPLASTY ANTERIOR APPROACH;  Surgeon: Ernie Cough, MD;  Location: [facility];  Service: Orthopedics;  Laterality: Left;   UMBILICAL HERNIA REPAIR  2007 approx    Review of Systems:    All systems reviewed and negative except where noted in HPI.    Physical Exam:  BP 132/70   Pulse (!) 56   Ht 5' 6 (1.676 m)   Wt 136 lb 12.8 oz (62.1 kg)   BMI 22.08 kg/m  No LMP for male patient.  General: Well-nourished, well-developed in no acute distress.  Lungs: Clear to auscultation bilaterally. Non-labored. Heart: Regular rate and rhythm, no murmurs rubs or gallops.  Abdomen: Bowel sounds are normal; Abdomen is Soft; No hepatosplenomegaly, masses or hernias;  No Abdominal Tenderness; No guarding or rebound tenderness. Neuro: Alert and oriented x 3.  Grossly intact.  Psych: Alert and cooperative, normal mood and affect.   Imaging Studies: No results found.  Labs: CBC    Component Value Date/Time   WBC 6.4 01/30/2024 1130   RBC 4.56 01/30/2024 1130   HGB 13.6 01/30/2024 1130   HCT 42.4 01/30/2024 1130   PLT 244 01/30/2024 1130   MCV 93.0 01/30/2024 1130   MCH 29.8 01/30/2024 1130   MCHC 32.1 01/30/2024 1130   RDW 13.2 01/30/2024 1130   LYMPHSABS 2.0 11/21/2023 1141   MONOABS 1.2 (H) 11/21/2023 1141   EOSABS 0.2 11/21/2023 1141   BASOSABS 0.1 11/21/2023 1141    CMP     Component Value Date/Time   NA 140 01/30/2024 1130   K 4.3 01/30/2024 1130   CL 107 01/30/2024 1130   CO2 29 01/30/2024 1130   GLUCOSE 106 (H) 01/30/2024  1130   BUN 17 01/30/2024 1130   CREATININE 0.91 01/30/2024 1130   CALCIUM  8.9 01/30/2024 1130   PROT 7.3 11/21/2023 1141   ALBUMIN  4.3 11/21/2023 1141   AST 17 11/21/2023 1141   ALT 11 11/21/2023 1141   ALKPHOS 141 (H) 11/21/2023 1141   BILITOT 0.5 11/21/2023 1141   GFRNONAA >60 01/30/2024 1130     Assessment and Plan:   Don Johnson is a 83 y.o. y/o male returns for follow-up of:  1.  GERD: Controlled on PPI. - Refilled omeprazole  40 Mg daily, # #90, 3 refills. Recommend Lifestyle Modifications to prevent Acid Reflux.  Rec. Avoid coffee, sodas, peppermint, garlic, onions, alcohol, citrus fruits, chocolate, tomatoes, fatty and spicey foods.  Avoid eating 2-3 hours  before bedtime.   - We discussed adverse side effects of PPIs. Recommend take lowest effective dose of PPI necessary to control acid reflux.  Reassurance regarding safety of PPI dose.  2.  History of distal esophageal stricture last dilated in 2017.  Currently asymptomatic.  He has no recurrent dysphagia. - Follow-up if he has recurrent solid food dysphagia.  3.  Colon cancer screening - Guidelines discussed. - No further colonoscopies recommended due to advanced age and increased risk of procedure.  Ellouise Console, PA-C  Follow up as needed.

## 2024-04-01 ENCOUNTER — Encounter: Payer: Self-pay | Admitting: Physician Assistant

## 2024-04-01 ENCOUNTER — Ambulatory Visit (INDEPENDENT_AMBULATORY_CARE_PROVIDER_SITE_OTHER): Admitting: Physician Assistant

## 2024-04-01 VITALS — BP 132/70 | HR 56 | Ht 66.0 in | Wt 136.8 lb

## 2024-04-01 DIAGNOSIS — K2100 Gastro-esophageal reflux disease with esophagitis, without bleeding: Secondary | ICD-10-CM

## 2024-04-01 DIAGNOSIS — K219 Gastro-esophageal reflux disease without esophagitis: Secondary | ICD-10-CM

## 2024-04-01 DIAGNOSIS — Z8719 Personal history of other diseases of the digestive system: Secondary | ICD-10-CM | POA: Diagnosis not present

## 2024-04-01 MED ORDER — OMEPRAZOLE 40 MG PO CPDR
40.0000 mg | DELAYED_RELEASE_CAPSULE | Freq: Every day | ORAL | 3 refills | Status: AC
Start: 2024-04-01 — End: ?

## 2024-04-01 NOTE — Progress Notes (Signed)
 Noted

## 2024-04-01 NOTE — Patient Instructions (Signed)
 We have sent the following medications to your pharmacy for you to pick up at your convenience: Omeprazole  40 mg once daily  Please follow up sooner if symptoms increase or worsen  Due to recent changes in healthcare laws, you may see the results of your imaging and laboratory studies on MyChart before your provider has had a chance to review them.  We understand that in some cases there may be results that are confusing or concerning to you. Not all laboratory results come back in the same time frame and the provider may be waiting for multiple results in order to interpret others.  Please give us  48 hours in order for your provider to thoroughly review all the results before contacting the office for clarification of your results.   Thank you for trusting me with your gastrointestinal care!   Ellouise Console, PA-C _______________________________________________________  If your blood pressure at your visit was 140/90 or greater, please contact your primary care physician to follow up on this.  _______________________________________________________  If you are age 25 or older, your body mass index should be between 23-30. Your Body mass index is 22.08 kg/m. If this is out of the aforementioned range listed, please consider follow up with your Primary Care Provider.  If you are age 44 or younger, your body mass index should be between 19-25. Your Body mass index is 22.08 kg/m. If this is out of the aformentioned range listed, please consider follow up with your Primary Care Provider.   ________________________________________________________  The [facility] providers would like to encourage you to use MYCHART to communicate with providers for non-urgent requests or questions.  Due to long hold times on the telephone, sending your provider a message by [facility] may be a faster and more efficient way to get a response.  Please allow 48 business hours for a response.  Please remember that this is  for non-urgent requests.  _______________________________________________________

## 2024-04-28 ENCOUNTER — Other Ambulatory Visit: Payer: Self-pay | Admitting: Internal Medicine

## 2024-05-08 DIAGNOSIS — L821 Other seborrheic keratosis: Secondary | ICD-10-CM | POA: Diagnosis not present

## 2024-05-08 DIAGNOSIS — L814 Other melanin hyperpigmentation: Secondary | ICD-10-CM | POA: Diagnosis not present

## 2024-05-08 DIAGNOSIS — D225 Melanocytic nevi of trunk: Secondary | ICD-10-CM | POA: Diagnosis not present

## 2024-05-08 DIAGNOSIS — Z85820 Personal history of malignant melanoma of skin: Secondary | ICD-10-CM | POA: Diagnosis not present

## 2024-05-08 DIAGNOSIS — Z08 Encounter for follow-up examination after completed treatment for malignant neoplasm: Secondary | ICD-10-CM | POA: Diagnosis not present

## 2024-05-13 ENCOUNTER — Ambulatory Visit
Admission: EM | Admit: 2024-05-13 | Discharge: 2024-05-13 | Disposition: A | Discharge status: Home / Self Care | Attending: Family Medicine | Admitting: Family Medicine

## 2024-05-13 ENCOUNTER — Encounter: Payer: Self-pay | Admitting: Emergency Medicine

## 2024-05-13 ENCOUNTER — Other Ambulatory Visit: Payer: Self-pay | Admitting: Internal Medicine

## 2024-05-13 ENCOUNTER — Other Ambulatory Visit: Payer: Self-pay

## 2024-05-13 DIAGNOSIS — R030 Elevated blood-pressure reading, without diagnosis of hypertension: Secondary | ICD-10-CM | POA: Diagnosis not present

## 2024-05-13 DIAGNOSIS — L237 Allergic contact dermatitis due to plants, except food: Secondary | ICD-10-CM

## 2024-05-13 MED ORDER — METHYLPREDNISOLONE ACETATE 80 MG/ML IJ SUSP
80.0000 mg | Freq: Once | INTRAMUSCULAR | Status: AC
  Administered 2024-05-13: 80 mg via INTRAMUSCULAR

## 2024-05-13 MED ORDER — TRIAMCINOLONE ACETONIDE 0.1 % EX CREA: 1.0000 | TOPICAL_CREAM | Freq: Two times a day (BID) | CUTANEOUS | 0 refills | Status: DC

## 2024-05-13 NOTE — ED Provider Notes (Signed)
 [facility]    CSN: [id number] Arrival date & time: 05/13/24  9175      History   Chief Complaint Chief Complaint  Patient presents with   Rash    HPI Don Johnson is a 83 y.o. male.   Patient presenting today with ongoing and worsening poison ivy or oak rash that started on his torso and is now across abdomen, back, and spreading to lower extremities over the last few days.  Denies new soaps or exposures, new foods or medications, throat itching or swelling, chest tightness, nausea, vomiting, diarrhea.  Has tried rubbing alcohol to the sites with minimal relief.    Past Medical History:  Diagnosis Date   Acquired deformity of nose    Bilateral recurrent inguinal hernia    BPH with obstruction/lower urinary tract symptoms    Cough 09/20/2016   DDD (degenerative disc disease), cervical    Diverticulosis of colon    GERD (gastroesophageal reflux disease)    Hiatal hernia    History of adenomatous polyp of colon    2006 tubular adenoma;  2010 tubular adenoma and hyperplastic   Hyperlipidemia    Hypertension    Malignant melanoma of nose (HCC)    OA (osteoarthritis)    hands   Organic sexual dysfunction    Pre-diabetes    S/P dilatation of esophageal stricture    multiple times --  last time w/ balloon 12-31-2015   Umbilical hernia    Wears dentures    upper denture, lower partial   Wears glasses     Patient Active Problem List   Diagnosis Date Noted   Keratoma 08/15/2023   Osteoarthritis of left hip joint due to dysplasia 07/13/2023   Jet lag syndrome 02/24/2020   Chronic bilateral low back pain without sciatica 09/02/2019   Piriformis syndrome, right 09/30/2018   Essential hypertension 08/28/2018   DDD (degenerative disc disease), cervical 07/29/2015   Prediabetes 06/30/2013   Erectile dysfunction 06/30/2013   ESOPHAGEAL STRICTURE 12/14/2008   Gastritis and gastroduodenitis 12/14/2008   BPH associated with nocturia 12/03/2008    Osteoarthrosis, hand 12/03/2008   Hyperlipidemia with target LDL less than 130 09/28/2008    Past Surgical History:  Procedure Laterality Date   COLONOSCOPY  last one 03-14-2011   ESOPHAGOGASTRODUODENOSCOPY (EGD) WITH ESOPHAGEAL DILATION  last one 12-31-2015   EXCISION NASAL MASS Left 02/07/2024   Procedure: EXCISION, MASS, NOSE;  Surgeon: Luciano Standing, MD;  Location: [facility] OR;  Service: ENT;  Laterality: Left;  Excision and repair of Mohs defect on left nose with adjacent tissue transfer; possible skin graft from face or ear; possible forehead flap; possible nasolabial flap; possible ear cartilage graft   GASTROCNEMIUS RECESSION Right 09/15/2021   Procedure: Gastroc recession;  Surgeon: Kit Rush, MD;  Location: [facility];  Service: Orthopedics;  Laterality: Right;   HAMMER TOE SURGERY Right 09/15/2021   Procedure: Right 2-4 metatarsal head resection; hammertoe correction;  Surgeon: Kit Rush, MD;  Location: [facility];  Service: Orthopedics;  Laterality: Right;   HEMORROIDECTOMY  1970's   INGUINAL HERNIA REPAIR Bilateral 1980's   LYMPH NODE BIOPSY Bilateral 02/07/2024   Procedure: LYMPH NODE BIOPSY;  Surgeon: Luciano Standing, MD;  Location: [facility] OR;  Service: ENT;  Laterality: Bilateral;  Bilateral Neck Sentinel Lymph Node Biopsy   NASAL FLAP ROTATION Left 03/12/2024   Procedure: DIVISION AND INSET OF LEFT NASOLABIAL FLAP;  Surgeon: Luciano Standing, MD;  Location:  SURGERY CENTER;  Service: ENT;  Laterality: Left;  DIVISION AND INSET OF LEFT NASOLABIAL FLAP   PENILE PROSTHESIS IMPLANT N/A 09/29/2016   Procedure: PENILE PROTHESIS INFLATABLE;  Surgeon: Arlena Gal, MD;  Location: [facility];  Service: Urology;  Laterality: N/A;   TOTAL HIP ARTHROPLASTY Left 11/01/2023   Procedure: TOTAL HIP ARTHROPLASTY ANTERIOR APPROACH;  Surgeon: Ernie Cough, MD;  Location: [facility];  Service: Orthopedics;  Laterality: Left;   UMBILICAL HERNIA REPAIR   2007 approx       Home Medications    Prior to Admission medications   Medication Sig Start Date End Date Taking? Authorizing Provider  triamcinolone  cream (KENALOG ) 0.1 % Apply 1 Application topically 2 (two) times daily. 05/13/24  Yes Stuart Vernell Norris, PA-C  atorvastatin  (LIPITOR) 40 MG tablet TAKE ONE TABLET BY MOUTH EVERY DAY 02/26/24   Joshua Debby CROME, MD  indapamide  (LOZOL ) 1.25 MG tablet Take 1.25 mg by mouth daily.    [provider]  Multiple Vitamin (MULTIVITAMIN WITH MINERALS) TABS tablet Take 1 tablet by mouth in the morning.    [provider]  omeprazole  (PRILOSEC) 40 MG capsule Take 1 capsule (40 mg total) by mouth daily. 04/01/24   [place], PA-C    Family History Family History  Problem Relation Age of Onset   Diabetes Mother    Colon polyps Mother    Arthritis Mother    Hypertension Mother    Hyperlipidemia Mother    Diabetes Sister    Leukemia Brother    COPD Brother    Pancreatic cancer Brother    Colon cancer Neg Hx    Stomach cancer Neg Hx     Social History Social History   Tobacco Use   Smoking status: Never   Smokeless tobacco: Never  Vaping Use   Vaping status: Never Used  Substance Use Topics   Alcohol use: No    Alcohol/week: 0.0 standard drinks of alcohol   Drug use: No     Allergies   Patient has no known allergies.   Review of Systems Review of Systems Per HPI  Physical Exam Triage Vital Signs ED Triage Vitals  Encounter Vitals Group     BP 05/13/24 0905 (!) 173/80     Girls Systolic BP Percentile --      Girls Diastolic BP Percentile --      Boys Systolic BP Percentile --      Boys Diastolic BP Percentile --      Pulse Rate 05/13/24 0905 (!) 54     Resp 05/13/24 0905 20     Temp 05/13/24 0905 97.8 F (36.6 C)     Temp Source 05/13/24 0905 Oral     SpO2 05/13/24 0905 94 %     Weight --      Height --      Head Circumference --      Peak Flow --      Pain Score 05/13/24 0904 0     Pain  Loc --      Pain Education --      Exclude from Growth Chart --    No data found.  Updated Vital Signs BP (!) 173/80 (BP Location: Right Arm)   Pulse (!) 54 Comment: reports baseline HR. NAD noted.  Temp 97.8 F (36.6 C) (Oral)   Resp 20   SpO2 94%   Visual Acuity Right Eye Distance:   Left Eye Distance:   Bilateral Distance:    Right Eye Near:   Left Eye Near:  Bilateral Near:     Physical Exam Vitals and nursing note reviewed.  Constitutional:      Appearance: Normal appearance.  HENT:     Head: Atraumatic.  Eyes:     Extraocular Movements: Extraocular movements intact.     Conjunctiva/sclera: Conjunctivae normal.  Cardiovascular:     Rate and Rhythm: Normal rate.  Pulmonary:     Effort: Pulmonary effort is normal.     Breath sounds: Normal breath sounds.  Musculoskeletal:        General: Normal range of motion.     Cervical back: Normal range of motion and neck supple.  Skin:    General: Skin is warm and dry.     Findings: Rash present.     Comments: Erythematous pinpoint papular rash widespread across torso, back, upper legs  Neurological:     General: No focal deficit present.     Mental Status: He is oriented to person, place, and time.  Psychiatric:        Mood and Affect: Mood normal.        Thought Content: Thought content normal.        Judgment: Judgment normal.      UC Treatments / Results  Labs (all labs ordered are listed, but only abnormal results are displayed) Labs Reviewed - No data to display  EKG   Radiology No results found.  Procedures Procedures (including critical care time)  Medications Ordered in UC Medications  methylPREDNISolone  acetate (DEPO-MEDROL ) injection 80 mg (80 mg Intramuscular Given 05/13/24 1022)    Initial Impression / Assessment and Plan / UC Course  I have reviewed the triage vital signs and the nursing notes.  Pertinent labs & imaging results that were available during my care of the patient were  reviewed by me and considered in my medical decision making (see chart for details).     Consistent with poison oak dermatitis.  Treat with Depo-Medrol  IM, triamcinolone  cream, antihistamines as needed.  Return for worsening symptoms.  Elevated blood pressure reading today in clinic, discussed close monitoring at home and follow-up with PCP if not improving.  DASH diet, lifestyle reviewed.  Final Clinical Impressions(s) / UC Diagnoses   Final diagnoses:  Poison oak dermatitis  Elevated blood pressure reading   Discharge Instructions   None    ED Prescriptions     Medication Sig Dispense Auth. Provider   triamcinolone  cream (KENALOG ) 0.1 % Apply 1 Application topically 2 (two) times daily. 80 g Stuart Vernell Norris, [place]      PDMP not reviewed this encounter.   Stuart Vernell Norris, [place] 05/13/24 1027

## 2024-05-13 NOTE — Telephone Encounter (Signed)
 Copied from CRM #[id number]. Topic: Clinical - Medication Refill >> May 13, 2024 11:54 AM Viola F wrote: Medication: indapamide  (LOZOL ) 1.25 MG tablet [[id number]]  Has the patient contacted their pharmacy? Yes (Agent: If no, request that the patient contact the pharmacy for the refill. If patient does not wish to contact the pharmacy document the reason why and proceed with request.) (Agent: If yes, when and what did the pharmacy advise?)  This is the patient's preferred pharmacy:  [facility] [place], [place] - [address] [place] [address] Phone: [phone] Fax: [phone]  Is this the correct pharmacy for this prescription? Yes If no, delete pharmacy and type the correct one.   Has the prescription been filled recently? Yes  Is the patient out of the medication? No  Has the patient been seen for an appointment in the last year OR does the patient have an upcoming appointment? Yes  Can we respond through MyChart? No  Agent: Please be advised that Rx refills may take up to 3 business days. We ask that you follow-up with your pharmacy.

## 2024-05-13 NOTE — ED Triage Notes (Signed)
 Pt reports possible poison ivy rash to bilateral shoulder, torso, and lower extremities. Reports has tried alcohol to sites with no change in symptoms.

## 2024-05-21 MED ORDER — INDAPAMIDE 1.25 MG PO TABS: 1.2500 mg | ORAL_TABLET | Freq: Every day | ORAL | 1 refills | Status: DC

## 2024-06-02 ENCOUNTER — Other Ambulatory Visit: Payer: Self-pay | Admitting: Internal Medicine

## 2024-06-02 DIAGNOSIS — E785 Hyperlipidemia, unspecified: Secondary | ICD-10-CM

## 2024-06-03 ENCOUNTER — Encounter: Payer: Self-pay | Admitting: Internal Medicine

## 2024-06-03 ENCOUNTER — Ambulatory Visit (INDEPENDENT_AMBULATORY_CARE_PROVIDER_SITE_OTHER): Admitting: Internal Medicine

## 2024-06-03 VITALS — BP 138/78 | HR 55 | Temp 97.8°F | Resp 16 | Ht 66.0 in | Wt 135.0 lb

## 2024-06-03 DIAGNOSIS — E785 Hyperlipidemia, unspecified: Secondary | ICD-10-CM | POA: Diagnosis not present

## 2024-06-03 DIAGNOSIS — R001 Bradycardia, unspecified: Secondary | ICD-10-CM | POA: Diagnosis not present

## 2024-06-03 DIAGNOSIS — I10 Essential (primary) hypertension: Secondary | ICD-10-CM

## 2024-06-03 DIAGNOSIS — C4331 Malignant melanoma of nose: Secondary | ICD-10-CM | POA: Diagnosis not present

## 2024-06-03 DIAGNOSIS — K439 Ventral hernia without obstruction or gangrene: Secondary | ICD-10-CM | POA: Diagnosis not present

## 2024-06-03 DIAGNOSIS — Z23 Encounter for immunization: Secondary | ICD-10-CM | POA: Diagnosis not present

## 2024-06-03 LAB — URINALYSIS, ROUTINE W REFLEX MICROSCOPIC
Bilirubin Urine: NEGATIVE
Hgb urine dipstick: NEGATIVE
Ketones, ur: NEGATIVE
Leukocytes,Ua: NEGATIVE
Nitrite: NEGATIVE
Specific Gravity, Urine: 1.025 (ref 1.000–1.030)
Total Protein, Urine: NEGATIVE
Urine Glucose: NEGATIVE
Urobilinogen, UA: 1 (ref 0.0–1.0)
pH: 6 (ref 5.0–8.0)

## 2024-06-03 LAB — CBC WITH DIFFERENTIAL/PLATELET
Basophils Absolute: 0 K/uL (ref 0.0–0.1)
Basophils Relative: 0.7 % (ref 0.0–3.0)
Eosinophils Absolute: 0.2 K/uL (ref 0.0–0.7)
Eosinophils Relative: 3.2 % (ref 0.0–5.0)
HCT: 44.5 % (ref 39.0–52.0)
Hemoglobin: 14.9 g/dL (ref 13.0–17.0)
Lymphocytes Relative: 29.8 % (ref 12.0–46.0)
Lymphs Abs: 1.8 K/uL (ref 0.7–4.0)
MCHC: 33.4 g/dL (ref 30.0–36.0)
MCV: 91.8 fl (ref 78.0–100.0)
Monocytes Absolute: 0.7 K/uL (ref 0.1–1.0)
Monocytes Relative: 12.5 % — ABNORMAL HIGH (ref 3.0–12.0)
Neutro Abs: 3.2 K/uL (ref 1.4–7.7)
Neutrophils Relative %: 53.8 % (ref 43.0–77.0)
Platelets: 218 K/uL (ref 150.0–400.0)
RBC: 4.84 Mil/uL (ref 4.22–5.81)
RDW: 14.9 % (ref 11.5–15.5)
WBC: 5.9 K/uL (ref 4.0–10.5)

## 2024-06-03 LAB — BASIC METABOLIC PANEL WITH GFR
BUN: 22 mg/dL (ref 6–23)
CO2: 30 meq/L (ref 19–32)
Calcium: 9.8 mg/dL (ref 8.4–10.5)
Chloride: 99 meq/L (ref 96–112)
Creatinine, Ser: 1.02 mg/dL (ref 0.40–1.50)
GFR: 67.98 mL/min (ref >60.00)
Glucose, Bld: 100 mg/dL — ABNORMAL HIGH (ref 70–99)
Potassium: 3.9 meq/L (ref 3.5–5.1)
Sodium: 140 meq/L (ref 135–145)

## 2024-06-03 LAB — HEPATIC FUNCTION PANEL
ALT: 25 U/L (ref 0–53)
AST: 31 U/L (ref 0–37)
Albumin: 4.8 g/dL (ref 3.5–5.2)
Alkaline Phosphatase: 82 U/L (ref 39–117)
Bilirubin, Direct: 0.2 mg/dL (ref 0.0–0.3)
Total Bilirubin: 0.8 mg/dL (ref 0.2–1.2)
Total Protein: 7.3 g/dL (ref 6.0–8.3)

## 2024-06-03 LAB — TSH: TSH: 2.69 u[IU]/mL (ref 0.35–5.50)

## 2024-06-03 LAB — CK: Total CK: 160 U/L (ref 17–232)

## 2024-06-03 LAB — MAGNESIUM: Magnesium: 2.1 mg/dL (ref 1.5–2.5)

## 2024-06-03 NOTE — Patient Instructions (Signed)
 Leg Cramps: What They Mean Leg cramps happen when one or more muscles tighten and there's no control over it. They can happen during exercise or when you're resting. Leg cramps are painful and can last for a few seconds to minutes. They can also come back many times before stopping. Usually, leg cramps aren't caused by a serious medical problem. Often, the cause isn't known. Some common causes include: Problems with moving or not moving the body, like: Working your muscles too hard, such as during intense exercise. Doing the same motion over and over. Not warming up or stretching before playing sports or doing activities. Using the wrong technique or form when playing sports or doing activities. Staying in one position for a long time. Water or electrolyte balance issues, like: Not drinking enough fluids or being dehydrated. Getting sick from too much heat. Having low levels of minerals called electrolytes in your blood, like potassium and calcium . This can happen from: Pregnancy. Taking medicines that make you pee more, also called diuretic medicines. Not getting enough nutrients from your diet. Side effects of some medicines. Follow these instructions at home: Eating and drinking Eat and drink as told. Eat a healthy diet that includes plenty of nutrients to help your muscles work well. A healthy diet includes fruits and vegetables, lean protein, whole grains, and low-fat or nonfat dairy products. Drink enough fluids to keep your pee pale yellow. Drinking more water may help prevent cramps. Managing pain and muscle cramping     Massage, stretch, and relax the cramped muscle. Do this for several minutes at a time. Use ice or an ice pack as told. Place a towel between your skin and the ice. Leave the ice on for 20 minutes, 2-3 times a day. Use heat as told. Use the heat source that your provider recommends, such as a moist heat pack or a heating pad. Do this as often as told. Place a  towel between your skin and the heat source. Leave the heat on for 20-30 minutes. If your skin turns red, take off the ice or heat right away to prevent skin damage. The risk of damage is higher if you can't feel pain, heat, or cold. Take hot showers or baths to help relax tight muscles. General instructions If you're having a lot of leg cramps, avoid hard workouts for several days. Take supplements and medicines only as told. Contact a health care provider if: Your leg cramps get worse or happen more often. Your leg cramps don't get better over time. Your foot becomes cold, numb, or blue. This information is not intended to replace advice given to you by your health care provider. Make sure you discuss any questions you have with your health care provider. Document Revised: 08/17/2023 Document Reviewed: 05/09/2023 Elsevier Patient Education  2025 [employer].

## 2024-06-03 NOTE — Progress Notes (Signed)
 Subjective:  Patient ID: Don Johnson, male    DOB: 1941/02/26  Age: 83 y.o. MRN: [id number]  CC: Medical Management of Chronic Issues (6 month follow up and BP medication refill. ), Hernia (Patient states that he has a hernia on her stomach that's causing him pain. The pain is intermittent but he wants a referral. ), and Hypertension   HPI Don Johnson presents for f/up ----  Discussed the use of AI scribe software for clinical note transcription with the patient, who gave verbal consent to proceed.  History of Present Illness Don Johnson is an 83 year old male who presents with worsening symptoms of a belly button hernia.  He has been experiencing worsening symptoms of his umbilical hernia, which have been intermittent, with a recent episode lasting about a week. There is no clear trigger related to eating habits. No impact on bowel movements, though he experiences occasional constipation. No nausea, vomiting, loss of appetite, or weight loss.  He mentions that his blood pressure was noted to be slightly high by someone else, but his own measurement was 128/75. No symptoms such as headaches, blurred vision, chest pain, or shortness of breath during physical activities like mowing the lawn. He experiences a sensation of imbalance when bending over, such as when washing his car tires, but denies dizziness or lightheadedness otherwise.  He denies any recent blood loss and is not currently taking iron supplements, though he did take them in the past. He maintains a stable weight between 135 and 143 pounds.     Outpatient Medications Prior to Visit  Medication Sig Dispense Refill   atorvastatin  (LIPITOR) 40 MG tablet TAKE ONE TABLET BY MOUTH EVERY DAY 90 tablet 0   indapamide  (LOZOL ) 1.25 MG tablet Take 1 tablet (1.25 mg total) by mouth daily. 90 tablet 1   Multiple Vitamin (MULTIVITAMIN WITH MINERALS) TABS tablet Take 1 tablet by mouth in the morning.     omeprazole  (PRILOSEC) 40  MG capsule Take 1 capsule (40 mg total) by mouth daily. 90 capsule 3   triamcinolone  cream (KENALOG ) 0.1 % Apply 1 Application topically 2 (two) times daily. 80 g 0   No facility-administered medications prior to visit.    ROS Review of Systems  Constitutional:  Negative for appetite change, chills, diaphoresis, fatigue and fever.  Respiratory: Negative.  Negative for cough, chest tightness, shortness of breath and wheezing.   Cardiovascular:  Negative for chest pain, palpitations and leg swelling.  Gastrointestinal: Negative.  Negative for abdominal pain, constipation, diarrhea, nausea and vomiting.  Endocrine: Negative.   Genitourinary: Negative.  Negative for difficulty urinating and dysuria.  Musculoskeletal:  Positive for arthralgias, back pain and gait problem. Negative for myalgias.  Skin: Negative.   Neurological:  Negative for dizziness and numbness.  Hematological:  Negative for adenopathy. Does not bruise/bleed easily.  Psychiatric/Behavioral: Negative.      Objective:  BP 138/78 (BP Location: Left Arm, Patient Position: Sitting, Cuff Size: Normal)   Pulse (!) 55   Temp 97.8 F (36.6 C) (Oral)   Resp 16   Ht 5' 6 (1.676 m)   Wt 135 lb (61.2 kg)   SpO2 96%   BMI 21.79 kg/m   BP Readings from Last 3 Encounters:  06/03/24 138/78  05/13/24 (!) 173/80  04/01/24 132/70    Wt Readings from Last 3 Encounters:  06/03/24 135 lb (61.2 kg)  04/01/24 136 lb 12.8 oz (62.1 kg)  03/12/24 135 lb 5.8 oz (61.4 kg)  Physical Exam Vitals reviewed.  Constitutional:      Appearance: Normal appearance.  HENT:     Nose: Nose normal.     Mouth/Throat:     Mouth: Mucous membranes are moist.  Eyes:     General: No scleral icterus.    Conjunctiva/sclera: Conjunctivae normal.  Cardiovascular:     Rate and Rhythm: Regular rhythm. Bradycardia present.     Heart sounds: No murmur heard.    No friction rub. No gallop.     Comments: EKG-- SB (new), 54 bpm No LVH, Q waves, or  ST/T wave changes    Pulmonary:     Effort: Pulmonary effort is normal.     Breath sounds: No stridor. No wheezing, rhonchi or rales.  Abdominal:     General: Abdomen is scaphoid. There is no distension.     Palpations: Abdomen is soft. There is no hepatomegaly, splenomegaly or mass.     Tenderness: There is no abdominal tenderness. There is no guarding.     Hernia: A hernia is present. Hernia is present in the ventral area.   Musculoskeletal:        General: Normal range of motion.     Cervical back: Neck supple.     Right lower leg: No edema.     Left lower leg: No edema.  Skin:    General: Skin is warm and dry.  Neurological:     General: No focal deficit present.     Mental Status: He is alert. Mental status is at baseline.  Psychiatric:        Mood and Affect: Mood normal.        Behavior: Behavior normal.     Lab Results  Component Value Date   WBC 5.9 06/03/2024   HGB 14.9 06/03/2024   HCT 44.5 06/03/2024   PLT 218.0 06/03/2024   GLUCOSE 100 (H) 06/03/2024   CHOL 131 11/21/2023   TRIG 84.0 11/21/2023   HDL 50.10 11/21/2023   LDLDIRECT 149.1 06/30/2013   LDLCALC 65 11/21/2023   ALT 25 06/03/2024   AST 31 06/03/2024   NA 140 06/03/2024   K 3.9 06/03/2024   CL 99 06/03/2024   CREATININE 1.02 06/03/2024   BUN 22 06/03/2024   CO2 30 06/03/2024   TSH 2.69 06/03/2024   PSA 0.57 09/02/2019   HGBA1C 5.8 11/21/2023    No results found.  Assessment & Plan:   Essential hypertension- BP is well controlled. EKG is negative for LVH. -     EKG 12-Lead -     Basic metabolic panel with GFR; Future -     CBC with Differential/Platelet; Future -     Magnesium; Future -     CK; Future -     Hepatic function panel; Future -     Urinalysis, Routine w reflex microscopic; Future -     TSH; Future  Need for immunization against influenza -     Flu vaccine HIGH DOSE PF(Fluzone Trivalent)  Bradycardia -     EKG 12-Lead -     TSH; Future  Hyperlipidemia with target  LDL less than 130- LDL goal achieved. Doing well on the statin  -     CK; Future -     Hepatic function panel; Future  Ventral hernia without obstruction or gangrene -     Ambulatory referral to General Surgery  Malignant melanoma of nose (HCC)- Doing well s/p excision.     Follow-up: Return in about 6 months (around  12/01/2024).  Debby Molt, MD

## 2024-06-04 ENCOUNTER — Ambulatory Visit: Payer: Self-pay | Admitting: Internal Medicine

## 2024-06-19 DIAGNOSIS — K429 Umbilical hernia without obstruction or gangrene: Secondary | ICD-10-CM | POA: Diagnosis not present

## 2024-06-24 DIAGNOSIS — M950 Acquired deformity of nose: Secondary | ICD-10-CM | POA: Diagnosis not present

## 2024-06-24 DIAGNOSIS — C4331 Malignant melanoma of nose: Secondary | ICD-10-CM | POA: Diagnosis not present

## 2024-06-24 DIAGNOSIS — Z48816 Encounter for surgical aftercare following surgery on the genitourinary system: Secondary | ICD-10-CM | POA: Diagnosis not present

## 2024-06-24 DIAGNOSIS — J34829 Nasal valve collapse, unspecified: Secondary | ICD-10-CM | POA: Diagnosis not present

## 2024-07-08 DIAGNOSIS — K429 Umbilical hernia without obstruction or gangrene: Secondary | ICD-10-CM | POA: Diagnosis not present

## 2024-07-08 DIAGNOSIS — K4021 Bilateral inguinal hernia, without obstruction or gangrene, recurrent: Secondary | ICD-10-CM | POA: Diagnosis not present

## 2024-07-31 DIAGNOSIS — M950 Acquired deformity of nose: Secondary | ICD-10-CM | POA: Diagnosis not present

## 2024-07-31 DIAGNOSIS — C4331 Malignant melanoma of nose: Secondary | ICD-10-CM | POA: Diagnosis not present

## 2024-08-05 DIAGNOSIS — C4331 Malignant melanoma of nose: Secondary | ICD-10-CM | POA: Diagnosis not present

## 2024-08-05 DIAGNOSIS — M950 Acquired deformity of nose: Secondary | ICD-10-CM | POA: Diagnosis not present

## 2024-08-14 ENCOUNTER — Other Ambulatory Visit: Payer: Self-pay | Admitting: Internal Medicine

## 2024-09-19 ENCOUNTER — Other Ambulatory Visit: Payer: Self-pay | Admitting: Internal Medicine

## 2024-09-19 DIAGNOSIS — E785 Hyperlipidemia, unspecified: Secondary | ICD-10-CM
